# Patient Record
Sex: Male | Born: 1940
Health system: Southern US, Community
[De-identification: ages and names within clinical notes are randomized; demographics above are authoritative.]

## PROBLEM LIST (undated history)

## (undated) DIAGNOSIS — E079 Disorder of thyroid, unspecified: Secondary | ICD-10-CM

## (undated) DIAGNOSIS — K5790 Diverticulosis of intestine, part unspecified, without perforation or abscess without bleeding: Secondary | ICD-10-CM

## (undated) DIAGNOSIS — C4491 Basal cell carcinoma of skin, unspecified: Secondary | ICD-10-CM

## (undated) DIAGNOSIS — G44059 Short lasting unilateral neuralgiform headache with conjunctival injection and tearing (SUNCT), not intractable: Secondary | ICD-10-CM

## (undated) DIAGNOSIS — E039 Hypothyroidism, unspecified: Secondary | ICD-10-CM

## (undated) DIAGNOSIS — Z860101 Personal history of adenomatous and serrated colon polyps: Secondary | ICD-10-CM

## (undated) DIAGNOSIS — E7800 Pure hypercholesterolemia, unspecified: Secondary | ICD-10-CM

## (undated) DIAGNOSIS — C801 Malignant (primary) neoplasm, unspecified: Secondary | ICD-10-CM

## (undated) DIAGNOSIS — E785 Hyperlipidemia, unspecified: Secondary | ICD-10-CM

## (undated) DIAGNOSIS — I10 Essential (primary) hypertension: Secondary | ICD-10-CM

## (undated) DIAGNOSIS — K219 Gastro-esophageal reflux disease without esophagitis: Secondary | ICD-10-CM

## (undated) DIAGNOSIS — K649 Unspecified hemorrhoids: Secondary | ICD-10-CM

## (undated) DIAGNOSIS — R7303 Prediabetes: Secondary | ICD-10-CM

## (undated) DIAGNOSIS — C44311 Basal cell carcinoma of skin of nose: Secondary | ICD-10-CM

## (undated) HISTORY — PX: INGUINAL HERNIA REPAIR: SHX194

## (undated) HISTORY — PX: CARTILAGE SURGERY: SHX1303

## (undated) HISTORY — PX: EYE SURGERY: SHX253

## (undated) HISTORY — DX: Short lasting unilateral neuralgiform headache with conjunctival injection and tearing (SUNCT), not intractable: G44.059

## (undated) HISTORY — PX: HERNIA REPAIR: SHX51

## (undated) HISTORY — PX: SKIN CANCER EXCISION: SHX779

## (undated) HISTORY — PX: THYROID SURGERY: SHX805

## (undated) HISTORY — DX: Essential (primary) hypertension: I10

---

## 1997-08-28 HISTORY — PX: TOTAL THYROIDECTOMY: SHX2547

## 1999-08-29 HISTORY — PX: PROSTATECTOMY: SHX69

## 2008-09-23 ENCOUNTER — Encounter: Admission: RE | Admit: 2008-09-23 | Discharge: 2008-09-23 | Payer: Self-pay | Admitting: Internal Medicine

## 2009-07-08 ENCOUNTER — Ambulatory Visit (HOSPITAL_COMMUNITY): Admission: RE | Admit: 2009-07-08 | Discharge: 2009-07-08 | Payer: Self-pay | Admitting: Gastroenterology

## 2011-05-31 ENCOUNTER — Other Ambulatory Visit: Payer: Self-pay | Admitting: Internal Medicine

## 2011-05-31 ENCOUNTER — Ambulatory Visit
Admission: RE | Admit: 2011-05-31 | Discharge: 2011-05-31 | Disposition: A | Discharge status: Home / Self Care | Payer: BC Managed Care – PPO | Source: Ambulatory Visit | Attending: Internal Medicine | Admitting: Internal Medicine

## 2011-05-31 DIAGNOSIS — K219 Gastro-esophageal reflux disease without esophagitis: Secondary | ICD-10-CM

## 2011-09-01 DIAGNOSIS — Z8546 Personal history of malignant neoplasm of prostate: Secondary | ICD-10-CM | POA: Diagnosis not present

## 2011-09-01 DIAGNOSIS — N529 Male erectile dysfunction, unspecified: Secondary | ICD-10-CM | POA: Diagnosis not present

## 2011-09-07 DIAGNOSIS — Z0000 Encounter for general adult medical examination without abnormal findings: Secondary | ICD-10-CM | POA: Diagnosis not present

## 2011-09-07 DIAGNOSIS — R030 Elevated blood-pressure reading, without diagnosis of hypertension: Secondary | ICD-10-CM | POA: Diagnosis not present

## 2011-09-07 DIAGNOSIS — C61 Malignant neoplasm of prostate: Secondary | ICD-10-CM | POA: Diagnosis not present

## 2011-09-07 DIAGNOSIS — E039 Hypothyroidism, unspecified: Secondary | ICD-10-CM | POA: Diagnosis not present

## 2011-09-07 DIAGNOSIS — J309 Allergic rhinitis, unspecified: Secondary | ICD-10-CM | POA: Diagnosis not present

## 2011-09-07 DIAGNOSIS — E785 Hyperlipidemia, unspecified: Secondary | ICD-10-CM | POA: Diagnosis not present

## 2011-09-07 DIAGNOSIS — E663 Overweight: Secondary | ICD-10-CM | POA: Diagnosis not present

## 2011-09-18 DIAGNOSIS — I10 Essential (primary) hypertension: Secondary | ICD-10-CM | POA: Diagnosis not present

## 2011-09-21 DIAGNOSIS — R030 Elevated blood-pressure reading, without diagnosis of hypertension: Secondary | ICD-10-CM | POA: Diagnosis not present

## 2011-10-19 DIAGNOSIS — R030 Elevated blood-pressure reading, without diagnosis of hypertension: Secondary | ICD-10-CM | POA: Diagnosis not present

## 2011-10-19 DIAGNOSIS — K439 Ventral hernia without obstruction or gangrene: Secondary | ICD-10-CM | POA: Diagnosis not present

## 2011-11-08 DIAGNOSIS — J209 Acute bronchitis, unspecified: Secondary | ICD-10-CM | POA: Diagnosis not present

## 2012-01-08 DIAGNOSIS — Z8546 Personal history of malignant neoplasm of prostate: Secondary | ICD-10-CM | POA: Diagnosis not present

## 2012-01-08 DIAGNOSIS — N529 Male erectile dysfunction, unspecified: Secondary | ICD-10-CM | POA: Diagnosis not present

## 2012-01-15 DIAGNOSIS — C44319 Basal cell carcinoma of skin of other parts of face: Secondary | ICD-10-CM | POA: Diagnosis not present

## 2012-01-15 DIAGNOSIS — L821 Other seborrheic keratosis: Secondary | ICD-10-CM | POA: Diagnosis not present

## 2012-01-15 DIAGNOSIS — L570 Actinic keratosis: Secondary | ICD-10-CM | POA: Diagnosis not present

## 2012-01-24 DIAGNOSIS — C44111 Basal cell carcinoma of skin of unspecified eyelid, including canthus: Secondary | ICD-10-CM | POA: Diagnosis not present

## 2012-02-27 DIAGNOSIS — J310 Chronic rhinitis: Secondary | ICD-10-CM | POA: Diagnosis not present

## 2012-02-27 DIAGNOSIS — J342 Deviated nasal septum: Secondary | ICD-10-CM | POA: Diagnosis not present

## 2012-02-28 ENCOUNTER — Other Ambulatory Visit (INDEPENDENT_AMBULATORY_CARE_PROVIDER_SITE_OTHER): Payer: Self-pay | Admitting: Otolaryngology

## 2012-02-28 DIAGNOSIS — J0100 Acute maxillary sinusitis, unspecified: Secondary | ICD-10-CM

## 2012-03-01 ENCOUNTER — Ambulatory Visit
Admission: RE | Admit: 2012-03-01 | Discharge: 2012-03-01 | Disposition: A | Discharge status: Home / Self Care | Payer: Medicare Other | Source: Ambulatory Visit | Attending: Otolaryngology | Admitting: Otolaryngology

## 2012-03-01 DIAGNOSIS — J0100 Acute maxillary sinusitis, unspecified: Secondary | ICD-10-CM

## 2012-03-06 DIAGNOSIS — E039 Hypothyroidism, unspecified: Secondary | ICD-10-CM | POA: Diagnosis not present

## 2012-03-06 DIAGNOSIS — E782 Mixed hyperlipidemia: Secondary | ICD-10-CM | POA: Diagnosis not present

## 2012-03-06 DIAGNOSIS — K219 Gastro-esophageal reflux disease without esophagitis: Secondary | ICD-10-CM | POA: Diagnosis not present

## 2012-03-06 DIAGNOSIS — R030 Elevated blood-pressure reading, without diagnosis of hypertension: Secondary | ICD-10-CM | POA: Diagnosis not present

## 2012-04-02 DIAGNOSIS — L723 Sebaceous cyst: Secondary | ICD-10-CM | POA: Diagnosis not present

## 2012-04-02 DIAGNOSIS — L821 Other seborrheic keratosis: Secondary | ICD-10-CM | POA: Diagnosis not present

## 2012-04-09 DIAGNOSIS — J342 Deviated nasal septum: Secondary | ICD-10-CM | POA: Diagnosis not present

## 2012-04-09 DIAGNOSIS — J343 Hypertrophy of nasal turbinates: Secondary | ICD-10-CM | POA: Diagnosis not present

## 2012-07-02 DIAGNOSIS — J029 Acute pharyngitis, unspecified: Secondary | ICD-10-CM | POA: Diagnosis not present

## 2012-07-02 DIAGNOSIS — J019 Acute sinusitis, unspecified: Secondary | ICD-10-CM | POA: Diagnosis not present

## 2012-07-30 DIAGNOSIS — C44319 Basal cell carcinoma of skin of other parts of face: Secondary | ICD-10-CM | POA: Diagnosis not present

## 2012-08-02 DIAGNOSIS — Z23 Encounter for immunization: Secondary | ICD-10-CM | POA: Diagnosis not present

## 2012-08-30 DIAGNOSIS — B029 Zoster without complications: Secondary | ICD-10-CM | POA: Diagnosis not present

## 2012-09-04 DIAGNOSIS — C44319 Basal cell carcinoma of skin of other parts of face: Secondary | ICD-10-CM | POA: Diagnosis not present

## 2012-09-09 DIAGNOSIS — E785 Hyperlipidemia, unspecified: Secondary | ICD-10-CM | POA: Diagnosis not present

## 2012-09-09 DIAGNOSIS — Z0000 Encounter for general adult medical examination without abnormal findings: Secondary | ICD-10-CM | POA: Diagnosis not present

## 2012-09-09 DIAGNOSIS — E039 Hypothyroidism, unspecified: Secondary | ICD-10-CM | POA: Diagnosis not present

## 2012-11-20 DIAGNOSIS — K625 Hemorrhage of anus and rectum: Secondary | ICD-10-CM | POA: Diagnosis not present

## 2012-11-20 DIAGNOSIS — K644 Residual hemorrhoidal skin tags: Secondary | ICD-10-CM | POA: Diagnosis not present

## 2012-11-20 DIAGNOSIS — K648 Other hemorrhoids: Secondary | ICD-10-CM | POA: Diagnosis not present

## 2012-12-19 DIAGNOSIS — L723 Sebaceous cyst: Secondary | ICD-10-CM | POA: Diagnosis not present

## 2013-03-04 DIAGNOSIS — Z8546 Personal history of malignant neoplasm of prostate: Secondary | ICD-10-CM | POA: Diagnosis not present

## 2013-03-04 DIAGNOSIS — C61 Malignant neoplasm of prostate: Secondary | ICD-10-CM | POA: Diagnosis not present

## 2013-03-10 DIAGNOSIS — S92919A Unspecified fracture of unspecified toe(s), initial encounter for closed fracture: Secondary | ICD-10-CM | POA: Diagnosis not present

## 2013-03-12 DIAGNOSIS — J0100 Acute maxillary sinusitis, unspecified: Secondary | ICD-10-CM | POA: Diagnosis not present

## 2013-03-13 DIAGNOSIS — Z8546 Personal history of malignant neoplasm of prostate: Secondary | ICD-10-CM | POA: Diagnosis not present

## 2013-03-13 DIAGNOSIS — N529 Male erectile dysfunction, unspecified: Secondary | ICD-10-CM | POA: Diagnosis not present

## 2013-03-18 DIAGNOSIS — L570 Actinic keratosis: Secondary | ICD-10-CM | POA: Diagnosis not present

## 2013-03-18 DIAGNOSIS — C44711 Basal cell carcinoma of skin of unspecified lower limb, including hip: Secondary | ICD-10-CM | POA: Diagnosis not present

## 2013-03-18 DIAGNOSIS — C4441 Basal cell carcinoma of skin of scalp and neck: Secondary | ICD-10-CM | POA: Diagnosis not present

## 2013-04-23 DIAGNOSIS — C44711 Basal cell carcinoma of skin of unspecified lower limb, including hip: Secondary | ICD-10-CM | POA: Diagnosis not present

## 2013-05-05 ENCOUNTER — Encounter (HOSPITAL_COMMUNITY): Payer: Self-pay | Admitting: Emergency Medicine

## 2013-05-05 ENCOUNTER — Emergency Department (HOSPITAL_COMMUNITY)
Admission: EM | Admit: 2013-05-05 | Discharge: 2013-05-05 | Disposition: A | Discharge status: Home / Self Care | Payer: Medicare Other | Attending: Emergency Medicine | Admitting: Emergency Medicine

## 2013-05-05 DIAGNOSIS — R109 Unspecified abdominal pain: Secondary | ICD-10-CM | POA: Insufficient documentation

## 2013-05-05 HISTORY — DX: Disorder of thyroid, unspecified: E07.9

## 2013-05-05 HISTORY — DX: Gastro-esophageal reflux disease without esophagitis: K21.9

## 2013-05-05 HISTORY — DX: Malignant (primary) neoplasm, unspecified: C80.1

## 2013-05-05 LAB — CBC
HCT: 46.2 % (ref 39.0–52.0)
Hemoglobin: 15.9 g/dL (ref 13.0–17.0)
MCV: 85.9 fL (ref 78.0–100.0)
RDW: 13.7 % (ref 11.5–15.5)
WBC: 10.1 10*3/uL (ref 4.0–10.5)

## 2013-05-05 LAB — COMPREHENSIVE METABOLIC PANEL
Alkaline Phosphatase: 80 U/L (ref 39–117)
BUN: 13 mg/dL (ref 6–23)
Chloride: 100 mEq/L (ref 96–112)
Creatinine, Ser: 0.95 mg/dL (ref 0.50–1.35)
GFR calc Af Amer: >90 mL/min (ref >90)
GFR calc non Af Amer: 82 mL/min — ABNORMAL LOW (ref >90)
Glucose, Bld: 98 mg/dL (ref 70–99)
Potassium: 4.1 mEq/L (ref 3.5–5.1)
Total Bilirubin: 0.8 mg/dL (ref 0.3–1.2)

## 2013-05-05 LAB — URINALYSIS, ROUTINE W REFLEX MICROSCOPIC
Ketones, ur: NEGATIVE mg/dL
Leukocytes, UA: NEGATIVE
Nitrite: NEGATIVE
Protein, ur: NEGATIVE mg/dL
Urobilinogen, UA: 0.2 mg/dL (ref 0.0–1.0)

## 2013-05-05 NOTE — ED Provider Notes (Signed)
CSN: [id number]     Arrival date & time 05/05/13  0741 History   First MD Initiated Contact with Patient 05/05/13 0747     No chief complaint on file.  (Consider location/radiation/quality/duration/timing/severity/associated sxs/prior Treatment) HPI Comments: 72 yo wm with cc of abd pain below umbilicus. Pt is s/p Mohs to LLE on 12 days ago.  Onset of pain 1700 05/04/13.  No f/c, n/v/d, f/u/d, hematuria, constipation or trauma.    Patient is a 72 y.o. male presenting with abdominal pain. The history is provided by the patient and the spouse.  Abdominal Pain Pain location:  Suprapubic Pain quality: aching   Pain quality: not bloating, not burning, not cramping, no fullness, not gnawing, not heavy and no pressure   Pain radiates to:  Does not radiate Pain severity:  Moderate Onset quality:  Gradual Duration:  15 hours Timing:  Constant Progression:  Unchanged Chronicity:  New Context: sick contacts   Context: not alcohol use, not awakening from sleep, not diet changes, not eating, not laxative use, not suspicious food intake and not trauma   Relieved by:  Nothing Worsened by:  Nothing tried Associated symptoms: no chills, no constipation, no cough, no diarrhea, no dysuria, no fatigue, no fever, no melena, no nausea, no shortness of breath and no sore throat   Risk factors: being elderly   Risk factors: no alcohol abuse, no aspirin use, has not had multiple surgeries, no NSAID use and not obese     No past medical history on file. No past surgical history on file. No family history on file. History  Substance Use Topics  . Smoking status: Not on file  . Smokeless tobacco: Not on file  . Alcohol Use: Not on file    Review of Systems  Constitutional: Negative for fever, chills and fatigue.  HENT: Negative.  Negative for sore throat.   Eyes: Negative.   Respiratory: Negative.  Negative for cough and shortness of breath.   Cardiovascular: Negative.   Gastrointestinal: Positive for  abdominal pain. Negative for nausea, diarrhea, constipation, blood in stool, melena, abdominal distention, anal bleeding and rectal pain.  Endocrine: Negative.   Genitourinary: Negative for dysuria.  Musculoskeletal: Negative.        Had Mohs procedure to anterior aspect of LLE shin; no issues with incision; sutures in place   Skin: Positive for wound.       Mohs procedure  Neurological: Negative.   Hematological: Negative.   Psychiatric/Behavioral: Negative.     Allergies  Review of patient's allergies indicates not on file.  Home Medications  No current outpatient prescriptions on file. There were no vitals taken for this visit. Physical Exam  Constitutional: He appears well-developed and well-nourished.  HENT:  Head: Normocephalic and atraumatic.  Eyes: Conjunctivae and EOM are normal. Pupils are equal, round, and reactive to light.  Neck: Normal range of motion. Neck supple.  Cardiovascular: Normal rate and regular rhythm.   Pulmonary/Chest: Effort normal and breath sounds normal.  Abdominal: Hernia confirmed negative in the right inguinal area and confirmed negative in the left inguinal area.    Genitourinary: Testes normal and penis normal. Cremasteric reflex is present. Circumcised.  No palpable hernia, no mass, no scrotal edema, 2 testicles descended nontender  Lymphadenopathy:       Right: No inguinal adenopathy present.       Left: No inguinal adenopathy present.   ABD: soft, NT, nd, no g/r/m, no HSM, no ttp at McBurney's point, Neg Murphys, benign exam, no  peritoneal signs.   Repeat exams unchanged.    ED Course  Procedures (including critical care time) Labs Review Labs Reviewed - No data to display Imaging Review No results found.  Results for orders placed during the hospital encounter of 05/05/13  CBC      Result Value Range   WBC 10.1  4.0 - 10.5 K/uL   RBC 5.38  4.22 - 5.81 MIL/uL   Hemoglobin 15.9  13.0 - 17.0 g/dL   HCT 11.9  14.7 - 82.9 %   MCV  85.9  78.0 - 100.0 fL   MCH 29.6  26.0 - 34.0 pg   MCHC 34.4  30.0 - 36.0 g/dL   RDW 56.2  13.0 - 86.5 %   Platelets 181  150 - 400 K/uL  COMPREHENSIVE METABOLIC PANEL      Result Value Range   Sodium 135  135 - 145 mEq/L   Potassium 4.1  3.5 - 5.1 mEq/L   Chloride 100  96 - 112 mEq/L   CO2 27  19 - 32 mEq/L   Glucose, Bld 98  70 - 99 mg/dL   BUN 13  6 - 23 mg/dL   Creatinine, Ser 7.84  0.50 - 1.35 mg/dL   Calcium 9.4  8.4 - 69.6 mg/dL   Total Protein 6.7  6.0 - 8.3 g/dL   Albumin 3.5  3.5 - 5.2 g/dL   AST 17  0 - 37 U/L   ALT 10  0 - 53 U/L   Alkaline Phosphatase 80  39 - 117 U/L   Total Bilirubin 0.8  0.3 - 1.2 mg/dL   GFR calc non Af Amer 82 (*) >90 mL/min   GFR calc Af Amer >90  >90 mL/min  URINALYSIS, ROUTINE W REFLEX MICROSCOPIC      Result Value Range   Color, Urine YELLOW  YELLOW   APPearance CLEAR  CLEAR   Specific Gravity, Urine 1.014  1.005 - 1.030   pH 6.5  5.0 - 8.0   Glucose, UA NEGATIVE  NEGATIVE mg/dL   Hgb urine dipstick NEGATIVE  NEGATIVE   Bilirubin Urine NEGATIVE  NEGATIVE   Ketones, ur NEGATIVE  NEGATIVE mg/dL   Protein, ur NEGATIVE  NEGATIVE mg/dL   Urobilinogen, UA 0.2  0.0 - 1.0 mg/dL   Nitrite NEGATIVE  NEGATIVE   Leukocytes, UA NEGATIVE  NEGATIVE    Date: 05/05/2013  Rate: 66  Rhythm: normal sinus rhythm  QRS Axis: normal  Intervals: QRS duration 114  ST/T Wave abnormalities: nonspecific ST changes  Conduction Disutrbances:nonspecific intraventricular conduction delay  Narrative Interpretation: NS ST changes  Old EKG Reviewed: none available    MDM  No diagnosis found. 72 year old white male presents emergency department with chief complaint of abdominal pain. The pain began one day ago.  Of note the patient denies fever or chills, nausea vomiting diarrhea, frequency urgency dysuria, constipation patient has normal vital signs, his abdominal exam is benign, he has no tenderness to palpation except for mild tenderness below his umbilicus or  peritoneal signs.  10:39 AM Vital signs normal, abdominal exam remains unchanged. No evidence of serious bacterial illness, DVT, surgical abdomen. I had a lengthy discussion with the patient and his wife regarding my plan. He is to return to emergency department immediately if he develops fever, worsening pain, vomiting, hematuria, lower extremity edema, or any other concern. At that point he will require a CT abdomen and pelvis to assess for intra-abdominal pathology. Patient has a primary care doctor  within making followup within the next couple days and he also has a followup with dermatology in 2 days.   The patient appears reasonably screened and/or stabilized for discharge and I doubt any other medical condition or other [facility] requiring further screening, evaluation, or treatment in the ED at this time prior to discharge. Pt and his wife had no questions at time of discharge.      Darlys Gales, MD 05/05/13 1044

## 2013-05-05 NOTE — ED Notes (Signed)
Lower abd pain since 5 pm no n/v/d denies dysuria, [facility] had skin camcer removed left lower leg 12 days ago

## 2013-05-07 DIAGNOSIS — C4441 Basal cell carcinoma of skin of scalp and neck: Secondary | ICD-10-CM | POA: Diagnosis not present

## 2013-07-08 DIAGNOSIS — Z23 Encounter for immunization: Secondary | ICD-10-CM | POA: Diagnosis not present

## 2013-07-10 DIAGNOSIS — N529 Male erectile dysfunction, unspecified: Secondary | ICD-10-CM | POA: Diagnosis not present

## 2013-09-10 DIAGNOSIS — J309 Allergic rhinitis, unspecified: Secondary | ICD-10-CM | POA: Diagnosis not present

## 2013-09-10 DIAGNOSIS — C61 Malignant neoplasm of prostate: Secondary | ICD-10-CM | POA: Diagnosis not present

## 2013-09-10 DIAGNOSIS — E782 Mixed hyperlipidemia: Secondary | ICD-10-CM | POA: Diagnosis not present

## 2013-09-10 DIAGNOSIS — E039 Hypothyroidism, unspecified: Secondary | ICD-10-CM | POA: Diagnosis not present

## 2013-09-10 DIAGNOSIS — Z0000 Encounter for general adult medical examination without abnormal findings: Secondary | ICD-10-CM | POA: Diagnosis not present

## 2013-10-17 ENCOUNTER — Emergency Department (HOSPITAL_COMMUNITY)
Admission: EM | Admit: 2013-10-17 | Discharge: 2013-10-17 | Disposition: A | Discharge status: Home / Self Care | Payer: Medicare Other | Attending: Emergency Medicine | Admitting: Emergency Medicine

## 2013-10-17 ENCOUNTER — Encounter (HOSPITAL_COMMUNITY): Payer: Self-pay | Admitting: Emergency Medicine

## 2013-10-17 DIAGNOSIS — E079 Disorder of thyroid, unspecified: Secondary | ICD-10-CM | POA: Insufficient documentation

## 2013-10-17 DIAGNOSIS — K219 Gastro-esophageal reflux disease without esophagitis: Secondary | ICD-10-CM | POA: Insufficient documentation

## 2013-10-17 DIAGNOSIS — Z8546 Personal history of malignant neoplasm of prostate: Secondary | ICD-10-CM | POA: Insufficient documentation

## 2013-10-17 DIAGNOSIS — M79604 Pain in right leg: Secondary | ICD-10-CM

## 2013-10-17 DIAGNOSIS — M79609 Pain in unspecified limb: Secondary | ICD-10-CM | POA: Insufficient documentation

## 2013-10-17 DIAGNOSIS — Z7982 Long term (current) use of aspirin: Secondary | ICD-10-CM | POA: Insufficient documentation

## 2013-10-17 DIAGNOSIS — Z79899 Other long term (current) drug therapy: Secondary | ICD-10-CM | POA: Insufficient documentation

## 2013-10-17 MED ORDER — ENOXAPARIN SODIUM 120 MG/0.8ML ~~LOC~~ SOLN
1.0000 mg/kg | Freq: Once | SUBCUTANEOUS | Status: AC
  Administered 2013-10-17: 110 mg via SUBCUTANEOUS
  Filled 2013-10-17: qty 0.8

## 2013-10-17 NOTE — ED Provider Notes (Signed)
CSN: [id number]     Arrival date & time 10/17/13  1858 History   First MD Initiated Contact with Patient 10/17/13 2007     Chief Complaint  Patient presents with  . Leg Pain     (Consider location/radiation/quality/duration/timing/severity/associated sxs/prior Treatment) HPI History provided by pt.   Pt presents w/ RLE pain x 1 week.  Started in popliteal space and has since spread proximally, today all the way up posteromedial thigh to the groin.  Was evaluated at an Urgent Care and referred to ED for DVT study.  Traveled by both car and plane to [place] the day of onset.  No associated fever, RLE edema/weakness/paresthesias/rash, CP or SOB.  Denies trauma.  No PMH.  Past Medical History  Diagnosis Date  . Acid reflux   . Cancer     prostate  . Thyroid disease    Past Surgical History  Procedure Laterality Date  . Thyroid surgery     History reviewed. No pertinent family history. History  Substance Use Topics  . Smoking status: Never Smoker   . Smokeless tobacco: Not on file  . Alcohol Use: Yes    Review of Systems  All other systems reviewed and are negative.      Allergies  Ivp dye  Home Medications   Current Outpatient Rx  Name  Route  Sig  Dispense  Refill  . aspirin 325 MG tablet   Oral   Take 325 mg by mouth daily.         Marland Kitchen esomeprazole (NEXIUM) 40 MG capsule   Oral   Take 40 mg by mouth daily before breakfast.         . levothyroxine (SYNTHROID, LEVOTHROID) 150 MCG tablet   Oral   Take 150 mcg by mouth daily before breakfast.          BP 175/88  Pulse 72  Temp(Src) 98.3 F (36.8 C) (Oral)  Resp 20  SpO2 100% Physical Exam  Nursing note and vitals reviewed. Constitutional: He is oriented to person, place, and time. He appears well-developed and well-nourished. No distress.  HENT:  Head: Normocephalic and atraumatic.  Eyes:  Normal appearance  Neck: Normal range of motion.  Cardiovascular: Normal rate and regular rhythm.    Pulmonary/Chest: Effort normal and breath sounds normal. No respiratory distress.  Musculoskeletal: Normal range of motion.  RLE w/out deformity, skin changes, edema.  Mild tenderness popliteal space and posteromedial thigh.  Pain posterior knee w/ passive flexion; no pain w/ plantar/dorsiflexion of ankle or flexion or internal/external rotation of hip. 2+ DP pulse and distal sensation intact.    Neurological: He is alert and oriented to person, place, and time.  Skin: Skin is warm and dry. No rash noted.  Psychiatric: He has a normal mood and affect. His behavior is normal.    ED Course  Procedures (including critical care time) Labs Review Labs Reviewed - No data to display Imaging Review No results found.  EKG Interpretation   None       MDM   Final diagnoses:  Leg pain, right    72yo healthy M presents w/ non-traumatic RLE pain x 1 week.  Was referred by an Urgent Care in [place] for DVT study.  RF for DVT include recent travel.  Tenderness right popliteal space and thigh, but no objective exam findings concerning for DVT.  Pt denies CP/SOB and VS w/in nml range.  No signs of infectious process. Prophylactic subq lovenox administered.  Outpatient venous duplex ordered  and instructions given to patient.  Advised return for CP/SOB in the meantime.      Remer Macho, PA-C 10/17/13 2100

## 2013-10-17 NOTE — ED Notes (Addendum)
Pt in c/o pain to right leg, pain started behind his knee and has spread to inside of right thigh, states he had long car ride and a week ago and again on Tuesday. Pain started a week ago. Denies redness or swelling to area.

## 2013-10-17 NOTE — ED Notes (Signed)
Pt reports pain begins in RLE just below the knee and radiates up to groin area. No edema or redness noted, area is not tender on palpation or with ambulation. Pt reports RLE "gave out" a couple of times yesterday while ambulating

## 2013-10-17 NOTE — Discharge Instructions (Signed)
Please return to the hospital tomorrow at 8am for ultrasound of right leg.  Enter the [employer] and go to [employer]".  Let them know at the desk that you are here for an ultrasound of your leg to rule out a blood clot.  Return to the ER immediately if you develop chest pain or shortness of breath in the meantime.

## 2013-10-18 ENCOUNTER — Ambulatory Visit (HOSPITAL_COMMUNITY)
Admission: RE | Admit: 2013-10-18 | Discharge: 2013-10-18 | Disposition: A | Discharge status: Home / Self Care | Payer: Medicare Other | Source: Ambulatory Visit | Attending: Emergency Medicine | Admitting: Emergency Medicine

## 2013-10-18 DIAGNOSIS — M79609 Pain in unspecified limb: Secondary | ICD-10-CM | POA: Insufficient documentation

## 2013-10-18 NOTE — Progress Notes (Signed)
VASCULAR LAB PRELIMINARY  PRELIMINARY  PRELIMINARY  PRELIMINARY  Right lower extremity venous Doppler completed.    Preliminary report:  There is no DVT or SVT noted in the right lower extremity.  Cinnamon Morency, RVT 10/18/2013, 9:10 AM

## 2013-10-18 NOTE — ED Provider Notes (Signed)
Medical screening examination/treatment/procedure(s) were performed by non-physician practitioner and as supervising physician I was immediately available for consultation/collaboration.  EKG Interpretation   None        Babette Relic, MD 10/18/13 [phone]

## 2013-11-06 DIAGNOSIS — N529 Male erectile dysfunction, unspecified: Secondary | ICD-10-CM | POA: Diagnosis not present

## 2014-02-25 DIAGNOSIS — Z8546 Personal history of malignant neoplasm of prostate: Secondary | ICD-10-CM | POA: Diagnosis not present

## 2014-03-09 DIAGNOSIS — Z8546 Personal history of malignant neoplasm of prostate: Secondary | ICD-10-CM | POA: Diagnosis not present

## 2014-03-09 DIAGNOSIS — N393 Stress incontinence (female) (male): Secondary | ICD-10-CM | POA: Diagnosis not present

## 2014-03-09 DIAGNOSIS — N529 Male erectile dysfunction, unspecified: Secondary | ICD-10-CM | POA: Diagnosis not present

## 2014-04-23 DIAGNOSIS — C44319 Basal cell carcinoma of skin of other parts of face: Secondary | ICD-10-CM | POA: Diagnosis not present

## 2014-04-23 DIAGNOSIS — L538 Other specified erythematous conditions: Secondary | ICD-10-CM | POA: Diagnosis not present

## 2014-04-23 DIAGNOSIS — L570 Actinic keratosis: Secondary | ICD-10-CM | POA: Diagnosis not present

## 2014-04-23 DIAGNOSIS — C4441 Basal cell carcinoma of skin of scalp and neck: Secondary | ICD-10-CM | POA: Diagnosis not present

## 2014-04-29 DIAGNOSIS — C4490 Unspecified malignant neoplasm of skin, unspecified: Secondary | ICD-10-CM | POA: Diagnosis not present

## 2014-04-29 DIAGNOSIS — C44319 Basal cell carcinoma of skin of other parts of face: Secondary | ICD-10-CM | POA: Diagnosis not present

## 2014-06-17 ENCOUNTER — Other Ambulatory Visit: Payer: Self-pay | Admitting: Gastroenterology

## 2014-07-15 DIAGNOSIS — Z23 Encounter for immunization: Secondary | ICD-10-CM | POA: Diagnosis not present

## 2014-08-05 DIAGNOSIS — C44311 Basal cell carcinoma of skin of nose: Secondary | ICD-10-CM | POA: Diagnosis not present

## 2014-08-06 DIAGNOSIS — C44311 Basal cell carcinoma of skin of nose: Secondary | ICD-10-CM | POA: Diagnosis not present

## 2014-08-06 DIAGNOSIS — M950 Acquired deformity of nose: Secondary | ICD-10-CM | POA: Diagnosis not present

## 2014-08-12 DIAGNOSIS — C44311 Basal cell carcinoma of skin of nose: Secondary | ICD-10-CM | POA: Diagnosis not present

## 2014-08-12 DIAGNOSIS — Z7982 Long term (current) use of aspirin: Secondary | ICD-10-CM | POA: Diagnosis not present

## 2014-08-12 DIAGNOSIS — Z91041 Radiographic dye allergy status: Secondary | ICD-10-CM | POA: Diagnosis not present

## 2014-08-12 DIAGNOSIS — Z888 Allergy status to other drugs, medicaments and biological substances status: Secondary | ICD-10-CM | POA: Diagnosis not present

## 2014-08-12 DIAGNOSIS — K219 Gastro-esophageal reflux disease without esophagitis: Secondary | ICD-10-CM | POA: Diagnosis not present

## 2014-08-12 DIAGNOSIS — C61 Malignant neoplasm of prostate: Secondary | ICD-10-CM | POA: Diagnosis not present

## 2014-08-12 DIAGNOSIS — E039 Hypothyroidism, unspecified: Secondary | ICD-10-CM | POA: Diagnosis not present

## 2014-09-07 DIAGNOSIS — Z7982 Long term (current) use of aspirin: Secondary | ICD-10-CM | POA: Diagnosis not present

## 2014-09-07 DIAGNOSIS — C7989 Secondary malignant neoplasm of other specified sites: Secondary | ICD-10-CM | POA: Diagnosis not present

## 2014-09-07 DIAGNOSIS — C44311 Basal cell carcinoma of skin of nose: Secondary | ICD-10-CM | POA: Diagnosis not present

## 2014-09-07 DIAGNOSIS — E079 Disorder of thyroid, unspecified: Secondary | ICD-10-CM | POA: Diagnosis not present

## 2014-09-07 DIAGNOSIS — Z8546 Personal history of malignant neoplasm of prostate: Secondary | ICD-10-CM | POA: Diagnosis not present

## 2014-09-15 ENCOUNTER — Ambulatory Visit (HOSPITAL_COMMUNITY): Admission: RE | Admit: 2014-09-15 | Payer: Medicare Other | Source: Ambulatory Visit | Admitting: Gastroenterology

## 2014-09-15 ENCOUNTER — Encounter (HOSPITAL_COMMUNITY): Admission: RE | Payer: Self-pay | Source: Ambulatory Visit

## 2014-09-15 SURGERY — COLONOSCOPY WITH PROPOFOL
Anesthesia: Monitor Anesthesia Care

## 2014-10-01 DIAGNOSIS — E782 Mixed hyperlipidemia: Secondary | ICD-10-CM | POA: Diagnosis not present

## 2014-10-01 DIAGNOSIS — Z800 Family history of malignant neoplasm of digestive organs: Secondary | ICD-10-CM | POA: Diagnosis not present

## 2014-10-01 DIAGNOSIS — G518 Other disorders of facial nerve: Secondary | ICD-10-CM | POA: Diagnosis not present

## 2014-10-01 DIAGNOSIS — Z23 Encounter for immunization: Secondary | ICD-10-CM | POA: Diagnosis not present

## 2014-10-01 DIAGNOSIS — C61 Malignant neoplasm of prostate: Secondary | ICD-10-CM | POA: Diagnosis not present

## 2014-10-01 DIAGNOSIS — E039 Hypothyroidism, unspecified: Secondary | ICD-10-CM | POA: Diagnosis not present

## 2014-10-01 DIAGNOSIS — Z1389 Encounter for screening for other disorder: Secondary | ICD-10-CM | POA: Diagnosis not present

## 2014-10-01 DIAGNOSIS — Z0001 Encounter for general adult medical examination with abnormal findings: Secondary | ICD-10-CM | POA: Diagnosis not present

## 2014-11-05 DIAGNOSIS — L304 Erythema intertrigo: Secondary | ICD-10-CM | POA: Diagnosis not present

## 2014-11-05 DIAGNOSIS — L300 Nummular dermatitis: Secondary | ICD-10-CM | POA: Diagnosis not present

## 2014-11-05 DIAGNOSIS — L820 Inflamed seborrheic keratosis: Secondary | ICD-10-CM | POA: Diagnosis not present

## 2015-01-15 ENCOUNTER — Other Ambulatory Visit: Payer: Self-pay | Admitting: Gastroenterology

## 2015-01-15 DIAGNOSIS — L300 Nummular dermatitis: Secondary | ICD-10-CM | POA: Diagnosis not present

## 2015-01-15 DIAGNOSIS — C44519 Basal cell carcinoma of skin of other part of trunk: Secondary | ICD-10-CM | POA: Diagnosis not present

## 2015-01-15 DIAGNOSIS — L304 Erythema intertrigo: Secondary | ICD-10-CM | POA: Diagnosis not present

## 2015-01-30 DIAGNOSIS — R233 Spontaneous ecchymoses: Secondary | ICD-10-CM | POA: Diagnosis not present

## 2015-02-01 DIAGNOSIS — R233 Spontaneous ecchymoses: Secondary | ICD-10-CM | POA: Diagnosis not present

## 2015-02-01 DIAGNOSIS — R21 Rash and other nonspecific skin eruption: Secondary | ICD-10-CM | POA: Diagnosis not present

## 2015-02-01 DIAGNOSIS — Z0000 Encounter for general adult medical examination without abnormal findings: Secondary | ICD-10-CM | POA: Diagnosis not present

## 2015-02-10 DIAGNOSIS — H5203 Hypermetropia, bilateral: Secondary | ICD-10-CM | POA: Diagnosis not present

## 2015-02-10 DIAGNOSIS — H25811 Combined forms of age-related cataract, right eye: Secondary | ICD-10-CM | POA: Diagnosis not present

## 2015-02-10 DIAGNOSIS — H25812 Combined forms of age-related cataract, left eye: Secondary | ICD-10-CM | POA: Diagnosis not present

## 2015-02-10 DIAGNOSIS — H52223 Regular astigmatism, bilateral: Secondary | ICD-10-CM | POA: Diagnosis not present

## 2015-02-10 DIAGNOSIS — H43813 Vitreous degeneration, bilateral: Secondary | ICD-10-CM | POA: Diagnosis not present

## 2015-02-10 DIAGNOSIS — H524 Presbyopia: Secondary | ICD-10-CM | POA: Diagnosis not present

## 2015-02-10 DIAGNOSIS — H35373 Puckering of macula, bilateral: Secondary | ICD-10-CM | POA: Diagnosis not present

## 2015-02-10 DIAGNOSIS — H43313 Vitreous membranes and strands, bilateral: Secondary | ICD-10-CM | POA: Diagnosis not present

## 2015-02-23 DIAGNOSIS — C44319 Basal cell carcinoma of skin of other parts of face: Secondary | ICD-10-CM | POA: Diagnosis not present

## 2015-02-23 DIAGNOSIS — L304 Erythema intertrigo: Secondary | ICD-10-CM | POA: Diagnosis not present

## 2015-03-08 DIAGNOSIS — Z8546 Personal history of malignant neoplasm of prostate: Secondary | ICD-10-CM | POA: Diagnosis not present

## 2015-03-15 DIAGNOSIS — N5201 Erectile dysfunction due to arterial insufficiency: Secondary | ICD-10-CM | POA: Diagnosis not present

## 2015-03-15 DIAGNOSIS — N393 Stress incontinence (female) (male): Secondary | ICD-10-CM | POA: Diagnosis not present

## 2015-03-15 DIAGNOSIS — C61 Malignant neoplasm of prostate: Secondary | ICD-10-CM | POA: Diagnosis not present

## 2015-04-08 ENCOUNTER — Encounter (HOSPITAL_COMMUNITY): Payer: Self-pay | Admitting: *Deleted

## 2015-04-09 DIAGNOSIS — L304 Erythema intertrigo: Secondary | ICD-10-CM | POA: Diagnosis not present

## 2015-04-09 DIAGNOSIS — C44519 Basal cell carcinoma of skin of other part of trunk: Secondary | ICD-10-CM | POA: Diagnosis not present

## 2015-04-09 DIAGNOSIS — L570 Actinic keratosis: Secondary | ICD-10-CM | POA: Diagnosis not present

## 2015-04-13 ENCOUNTER — Ambulatory Visit (HOSPITAL_COMMUNITY)
Admission: RE | Admit: 2015-04-13 | Discharge: 2015-04-13 | Disposition: A | Discharge status: Home / Self Care | Payer: Medicare Other | Source: Ambulatory Visit | Attending: Gastroenterology | Admitting: Gastroenterology

## 2015-04-13 ENCOUNTER — Encounter (HOSPITAL_COMMUNITY)
Admission: RE | Disposition: A | Discharge status: Home / Self Care | Payer: Self-pay | Source: Ambulatory Visit | Attending: Gastroenterology

## 2015-04-13 ENCOUNTER — Ambulatory Visit (HOSPITAL_COMMUNITY): Payer: Medicare Other | Admitting: Anesthesiology

## 2015-04-13 ENCOUNTER — Encounter (HOSPITAL_COMMUNITY): Payer: Self-pay

## 2015-04-13 DIAGNOSIS — Z85828 Personal history of other malignant neoplasm of skin: Secondary | ICD-10-CM | POA: Diagnosis not present

## 2015-04-13 DIAGNOSIS — D120 Benign neoplasm of cecum: Secondary | ICD-10-CM | POA: Insufficient documentation

## 2015-04-13 DIAGNOSIS — Z8546 Personal history of malignant neoplasm of prostate: Secondary | ICD-10-CM | POA: Diagnosis not present

## 2015-04-13 DIAGNOSIS — Z1211 Encounter for screening for malignant neoplasm of colon: Secondary | ICD-10-CM | POA: Insufficient documentation

## 2015-04-13 DIAGNOSIS — Z800 Family history of malignant neoplasm of digestive organs: Secondary | ICD-10-CM | POA: Diagnosis not present

## 2015-04-13 DIAGNOSIS — E780 Pure hypercholesterolemia: Secondary | ICD-10-CM | POA: Insufficient documentation

## 2015-04-13 DIAGNOSIS — E039 Hypothyroidism, unspecified: Secondary | ICD-10-CM | POA: Diagnosis not present

## 2015-04-13 DIAGNOSIS — K219 Gastro-esophageal reflux disease without esophagitis: Secondary | ICD-10-CM | POA: Diagnosis not present

## 2015-04-13 DIAGNOSIS — D125 Benign neoplasm of sigmoid colon: Secondary | ICD-10-CM | POA: Insufficient documentation

## 2015-04-13 DIAGNOSIS — K635 Polyp of colon: Secondary | ICD-10-CM | POA: Diagnosis not present

## 2015-04-13 DIAGNOSIS — Z86010 Personal history of colonic polyps: Secondary | ICD-10-CM | POA: Diagnosis not present

## 2015-04-13 HISTORY — PX: COLONOSCOPY WITH PROPOFOL: SHX5780

## 2015-04-13 SURGERY — COLONOSCOPY WITH PROPOFOL
Anesthesia: Monitor Anesthesia Care

## 2015-04-13 MED ORDER — LACTATED RINGERS IV SOLN
INTRAVENOUS | Status: DC
  Administered 2015-04-13: 09:00:00 via INTRAVENOUS

## 2015-04-13 MED ORDER — LIDOCAINE HCL (CARDIAC) 20 MG/ML IV SOLN
INTRAVENOUS | Status: AC
  Filled 2015-04-13: qty 5

## 2015-04-13 MED ORDER — PROPOFOL 10 MG/ML IV BOLUS
INTRAVENOUS | Status: AC
  Filled 2015-04-13: qty 20

## 2015-04-13 MED ORDER — SODIUM CHLORIDE 0.9 % IV SOLN: INTRAVENOUS | Status: DC

## 2015-04-13 MED ORDER — PROPOFOL 500 MG/50ML IV EMUL
INTRAVENOUS | Status: DC | PRN
  Administered 2015-04-13: 50 mg via INTRAVENOUS

## 2015-04-13 MED ORDER — PROPOFOL INFUSION 10 MG/ML OPTIME
INTRAVENOUS | Status: DC | PRN
  Administered 2015-04-13: 140 ug/kg/min via INTRAVENOUS

## 2015-04-13 SURGICAL SUPPLY — 21 items

## 2015-04-13 NOTE — Transfer of Care (Signed)
Immediate Anesthesia Transfer of Care Note  Patient: Robert Clarke  Procedure(s) Performed: Procedure(s): COLONOSCOPY WITH PROPOFOL (N/A)  Patient Location: PACU  Anesthesia Type:MAC  Level of Consciousness: awake, alert  and oriented  Airway & Oxygen Therapy: Patient Spontanous Breathing and Patient connected to face mask oxygen  Post-op Assessment: Report given to RN and Post -op Vital signs reviewed and stable  Post vital signs: Reviewed and stable  Last Vitals:  Filed Vitals:   04/13/15 0915  BP: 147/78  Pulse: 66  Temp:   Resp: 10    Complications: No apparent anesthesia complications

## 2015-04-13 NOTE — Op Note (Signed)
Procedure: Screening colonoscopy. Father died of colon cancer at age 73  Endoscopist: Earle Gell  Premedication: Propofol administered by anesthesia  Procedure: The patient was placed in the left lateral decubitus position. Anal inspection and digital rectal exam were normal. The Pentax pediatric colonoscope was introduced into the rectum and advanced to the cecum. A normal-appearing appendiceal orifice and ileocecal valve were identified. Colonic preparation for the exam today was good. Withdrawal time was 25 minutes  Rectum. Normal. Retroflex view of the distal rectum was normal  Sigmoid colon. From the mid sigmoid colon, a 1 cm pedunculated polyp was removed with the electrocautery snare. A 3 mm sessile polyp was removed with the cold biopsy forceps. Colonic diverticulosis.  Descending colon. Colonic diverticulosis  Splenic flexure. Normal  Transverse colon. Normal  Hepatic flexure. Normal  Ascending colon. Normal  Cecum and ileocecal valve. A 3 mm sessile polyp was removed from the cecum with the cold biopsy forceps  Assessment: A diminutive polyp was removed from the cecum, a diminutive polyp was removed from the sigmoid colon, and a 1 cm pedunculated polyp was removed from the colon with the hot snare.  Recommendation: I will evaluate the pathology report determine when the patient should go a repeat colonoscopy

## 2015-04-13 NOTE — H&P (Signed)
  Procedure: Screening colonoscopy. Father died of colon cancer at age 73. Four previous colonoscopies were normal. Normal screening colonoscopy performed on 07/08/2009  History: The patient is a 74 year old male born 02-23-1941. His father died of colon cancer at age 30. His 4 previous colonoscopies showed no colorectal neoplasia. He is scheduled to undergo a repeat screening colonoscopy today.  Medication allergies: None  Past medical history: Prostatectomy performed in 2002. Thyroidectomy to treat goiter performed in 1995. Mohs surgery to remove basal cell skin cancers. Hernia repair surgery. Prostate cancer. Seasonal allergies. Hypercholesterolemia. Secondary hypothyroidism.  Exam: The patient is alert and lying comfortably on the endoscopy stretcher. Abdomen is soft and nontender to palpation. Lungs are clear to auscultation. Cardiac exam reveals a regular rhythm.  Plan: Proceed with screening colonoscopy

## 2015-04-13 NOTE — Anesthesia Postprocedure Evaluation (Signed)
  Anesthesia Post-op Note  Patient: Robert Clarke  Procedure(s) Performed: Procedure(s) (LRB): COLONOSCOPY WITH PROPOFOL (N/A)  Patient Location: PACU  Anesthesia Type: MAC  Level of Consciousness: awake and alert   Airway and Oxygen Therapy: Patient Spontanous Breathing  Post-op Pain: mild  Post-op Assessment: Post-op Vital signs reviewed, Patient's Cardiovascular Status Stable, Respiratory Function Stable, Patent Airway and No signs of Nausea or vomiting  Last Vitals:  Filed Vitals:   04/13/15 1030  BP: 134/70  Pulse: 63  Temp:   Resp: 22    Post-op Vital Signs: stable   Complications: No apparent anesthesia complications

## 2015-04-13 NOTE — Anesthesia Preprocedure Evaluation (Addendum)
Anesthesia Evaluation  Patient identified by MRN, date of birth, ID band Patient awake    Reviewed: Allergy & Precautions, NPO status , Patient's Chart, lab work & pertinent test results  Airway Mallampati: II  TM Distance: >3 FB Neck ROM: Full    Dental no notable dental hx. (+) Upper Dentures, Partial Lower   Pulmonary neg pulmonary ROS,  breath sounds clear to auscultation  Pulmonary exam normal       Cardiovascular negative cardio ROS Normal cardiovascular examRhythm:Regular Rate:Normal     Neuro/Psych negative neurological ROS  negative psych ROS   GI/Hepatic negative GI ROS, Neg liver ROS, GERD-  Medicated,  Endo/Other  negative endocrine ROS  Renal/GU negative Renal ROS  negative genitourinary   Musculoskeletal negative musculoskeletal ROS (+)   Abdominal   Peds negative pediatric ROS (+)  Hematology negative hematology ROS (+)   Anesthesia Other Findings   Reproductive/Obstetrics negative OB ROS                             Anesthesia Physical Anesthesia Plan  ASA: II  Anesthesia Plan: MAC   Post-op Pain Management:    Induction:   Airway Management Planned: Simple Face Mask and Natural Airway  Additional Equipment:   Intra-op Plan:   Post-operative Plan:   Informed Consent: I have reviewed the patients History and Physical, chart, labs and discussed the procedure including the risks, benefits and alternatives for the proposed anesthesia with the patient or authorized representative who has indicated his/her understanding and acceptance.   Dental advisory given  Plan Discussed with: CRNA  Anesthesia Plan Comments:         Anesthesia Quick Evaluation

## 2015-04-14 ENCOUNTER — Encounter (HOSPITAL_COMMUNITY): Payer: Self-pay | Admitting: Gastroenterology

## 2015-04-14 DIAGNOSIS — J302 Other seasonal allergic rhinitis: Secondary | ICD-10-CM | POA: Diagnosis not present

## 2015-04-14 DIAGNOSIS — T148 Other injury of unspecified body region: Secondary | ICD-10-CM | POA: Diagnosis not present

## 2015-04-14 DIAGNOSIS — M542 Cervicalgia: Secondary | ICD-10-CM | POA: Diagnosis not present

## 2015-08-06 DIAGNOSIS — Z23 Encounter for immunization: Secondary | ICD-10-CM | POA: Diagnosis not present

## 2015-10-03 DIAGNOSIS — J0100 Acute maxillary sinusitis, unspecified: Secondary | ICD-10-CM | POA: Diagnosis not present

## 2015-10-14 DIAGNOSIS — L821 Other seborrheic keratosis: Secondary | ICD-10-CM | POA: Diagnosis not present

## 2015-10-14 DIAGNOSIS — L304 Erythema intertrigo: Secondary | ICD-10-CM | POA: Diagnosis not present

## 2015-10-19 DIAGNOSIS — Z0000 Encounter for general adult medical examination without abnormal findings: Secondary | ICD-10-CM | POA: Diagnosis not present

## 2015-10-19 DIAGNOSIS — E039 Hypothyroidism, unspecified: Secondary | ICD-10-CM | POA: Diagnosis not present

## 2015-10-19 DIAGNOSIS — Z1389 Encounter for screening for other disorder: Secondary | ICD-10-CM | POA: Diagnosis not present

## 2015-10-19 DIAGNOSIS — J309 Allergic rhinitis, unspecified: Secondary | ICD-10-CM | POA: Diagnosis not present

## 2015-10-19 DIAGNOSIS — E782 Mixed hyperlipidemia: Secondary | ICD-10-CM | POA: Diagnosis not present

## 2015-10-19 DIAGNOSIS — C61 Malignant neoplasm of prostate: Secondary | ICD-10-CM | POA: Diagnosis not present

## 2015-10-19 DIAGNOSIS — Z800 Family history of malignant neoplasm of digestive organs: Secondary | ICD-10-CM | POA: Diagnosis not present

## 2016-02-15 DIAGNOSIS — L578 Other skin changes due to chronic exposure to nonionizing radiation: Secondary | ICD-10-CM | POA: Diagnosis not present

## 2016-02-15 DIAGNOSIS — L820 Inflamed seborrheic keratosis: Secondary | ICD-10-CM | POA: Diagnosis not present

## 2016-02-15 DIAGNOSIS — L821 Other seborrheic keratosis: Secondary | ICD-10-CM | POA: Diagnosis not present

## 2016-02-15 DIAGNOSIS — L304 Erythema intertrigo: Secondary | ICD-10-CM | POA: Diagnosis not present

## 2016-02-15 DIAGNOSIS — L570 Actinic keratosis: Secondary | ICD-10-CM | POA: Diagnosis not present

## 2016-03-28 DIAGNOSIS — C61 Malignant neoplasm of prostate: Secondary | ICD-10-CM | POA: Diagnosis not present

## 2016-04-04 DIAGNOSIS — N393 Stress incontinence (female) (male): Secondary | ICD-10-CM | POA: Diagnosis not present

## 2016-04-04 DIAGNOSIS — C61 Malignant neoplasm of prostate: Secondary | ICD-10-CM | POA: Diagnosis not present

## 2016-04-04 DIAGNOSIS — N5201 Erectile dysfunction due to arterial insufficiency: Secondary | ICD-10-CM | POA: Diagnosis not present

## 2016-05-03 ENCOUNTER — Ambulatory Visit (INDEPENDENT_AMBULATORY_CARE_PROVIDER_SITE_OTHER): Payer: Medicare Other | Admitting: Sports Medicine

## 2016-05-03 ENCOUNTER — Encounter: Payer: Self-pay | Admitting: Sports Medicine

## 2016-05-03 ENCOUNTER — Ambulatory Visit (INDEPENDENT_AMBULATORY_CARE_PROVIDER_SITE_OTHER): Payer: Medicare Other

## 2016-05-03 DIAGNOSIS — M6528 Calcific tendinitis, other site: Secondary | ICD-10-CM | POA: Diagnosis not present

## 2016-05-03 DIAGNOSIS — M79672 Pain in left foot: Secondary | ICD-10-CM | POA: Diagnosis not present

## 2016-05-03 MED ORDER — TRIAMCINOLONE ACETONIDE 10 MG/ML IJ SUSP: 10.0000 mg | Freq: Once | INTRAMUSCULAR | Status: DC

## 2016-05-03 NOTE — Patient Instructions (Signed)

## 2016-05-03 NOTE — Progress Notes (Signed)
Subjective: Shauntez Constante is a 75 y.o. male patient who presents to office for evaluation of Left heel pain. Patient complains of progressive pain especially over the last 3 months with a large bump at the back of the heel. Reports he had issues with this in the past and saw a Ortho doctor and went to PT. 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 Patient denies any other pedal complaints.   Patient Active Problem List   Diagnosis Date Noted  . Basal cell carcinoma of nose 08/06/2014    Current Outpatient Prescriptions on File Prior to Visit  Medication Sig Dispense Refill  . esomeprazole (NEXIUM) 40 MG capsule Take 40 mg by mouth daily before breakfast.    . levothyroxine (SYNTHROID, LEVOTHROID) 150 MCG tablet Take 150 mcg by mouth daily before breakfast.     No current facility-administered medications on file prior to visit.     Allergies  Allergen Reactions  . Iodine Rash  . Iodine-131 Rash  . Ivp Dye [Iodinated Diagnostic Agents] Itching and Rash    Objective:  General: Alert and oriented x3 in no acute distress  Dermatology: No open lesions bilateral lower extremities, no webspace macerations, no ecchymosis bilateral, all nails x 10 are well manicured.  Vascular: Dorsalis Pedis and Posterior Tibial pedal pulses 1/4, Capillary Fill Time 3 seconds, + pedal hair growth bilateral, + varicosities, no edema bilateral lower extremities, Temperature gradient within normal limits.  Neurology: Gross sensation intact via light touch bilateral. - Tinels sign left foot.   Musculoskeletal: Mild tenderness with palpation at insertion of the Achilles at medial aspect on left, there is calcaneal exostosis with mild soft tissue fullness present and decreased ankle rom with knee extending  vs flexed resembling gastroc equnius bilateral, The achilles tendon feels intact with no nodularity or  palpable dell, Thompson sign negative, Subtalar and midtarsal joint range of motion is within normal limits, there is no 1st ray hypermobility or symptomatic forefoot deformity noted bilateral.   Xrays  Left Foot    Impression: Normal osseous mineralization. Joint spaces preserved except at ankle and midtarsal joint where there is narrowing and spurring suggestive of osteoarthritis. No fracture/dislocation/boney destruction. Calcaneal spur present with extension into insertion of tendon suggestive of calcific changes at achilles insertion. Kager's triangle intact with no obliteration. No soft tissue abnormalities or radiopaque foreign bodies.   Assessment and Plan: Problem List Items Addressed This Visit    None    Visit Diagnoses    Left foot pain    -  Primary   Relevant Medications   triamcinolone acetonide (KENALOG) 10 MG/ML injection 10 mg (Start on 05/03/2016 11:30 AM)   Other Relevant Orders   DG Foot 2 Views Left   Calcific Achilles tendinitis       Relevant Medications   triamcinolone acetonide (KENALOG) 10 MG/ML injection 10 mg (Start on 05/03/2016 11:30 AM)      -Complete examination performed -Xrays reviewed -Discussed treatement options for pain at medial insertion of achilles on left -After oral consent and aseptic prep, injected a mixture containing 1 ml of 2% plain lidocaine, 1 ml 0.5% plain marcaine, 0.5 ml of kenalog 10 and 0.5 ml of dexamethasone phosphate at medial insertion of achilles with care to avoid violating the tendon, without complication. Post-injection care discussed with patient.  -Recommend icing to left heel daily -Dispensed heel lifts to protect achilles to wear  daily in good supportive shoes -Recommend gentle stretching as instructed -Recommend refrain from strenuous activity, hills, steps, excessive driving; may need rest every 30 mins until heel pain is resolved  -If No improvement will consider MRI/PT/EPAT -Patient to return to office in 1 month or  sooner if condition worsens.  Landis Martins, DPM

## 2016-05-17 DIAGNOSIS — N453 Epididymo-orchitis: Secondary | ICD-10-CM | POA: Diagnosis not present

## 2016-05-25 DIAGNOSIS — H2513 Age-related nuclear cataract, bilateral: Secondary | ICD-10-CM | POA: Diagnosis not present

## 2016-05-25 DIAGNOSIS — H524 Presbyopia: Secondary | ICD-10-CM | POA: Diagnosis not present

## 2016-06-01 ENCOUNTER — Ambulatory Visit (INDEPENDENT_AMBULATORY_CARE_PROVIDER_SITE_OTHER): Payer: Medicare Other | Admitting: Sports Medicine

## 2016-06-01 ENCOUNTER — Encounter: Payer: Self-pay | Admitting: Sports Medicine

## 2016-06-01 DIAGNOSIS — M79672 Pain in left foot: Secondary | ICD-10-CM | POA: Diagnosis not present

## 2016-06-01 DIAGNOSIS — M6528 Calcific tendinitis, other site: Secondary | ICD-10-CM | POA: Diagnosis not present

## 2016-06-01 NOTE — Progress Notes (Signed)
Subjective: Robert Clarke is a 75 y.o. male patient who returns to office for evaluation of Left heel pain. Patient states that injection helped had no pain and then did a long drive and started to feel a little pain, now 1/10. States feels best with heel lifts. Patient denies any other pedal complaints.   Patient Active Problem List   Diagnosis Date Noted  . Basal cell carcinoma of nose 08/06/2014    Current Outpatient Prescriptions on File Prior to Visit  Medication Sig Dispense Refill  . aspirin EC 325 MG tablet Take 325 mg by mouth.    . esomeprazole (NEXIUM) 40 MG capsule Take 40 mg by mouth daily before breakfast.    . fluticasone (FLONASE) 50 MCG/ACT nasal spray     . levothyroxine (SYNTHROID, LEVOTHROID) 137 MCG tablet     . levothyroxine (SYNTHROID, LEVOTHROID) 137 MCG tablet     . levothyroxine (SYNTHROID, LEVOTHROID) 150 MCG tablet Take 150 mcg by mouth daily before breakfast.    . omeprazole (PRILOSEC) 40 MG capsule     . omeprazole (PRILOSEC) 40 MG capsule      Current Facility-Administered Medications on File Prior to Visit  Medication Dose Route Frequency Provider Last Rate Last Dose  . triamcinolone acetonide (KENALOG) 10 MG/ML injection 10 mg  10 mg Other Once Landis Martins, DPM        Allergies  Allergen Reactions  . Iodine Rash  . Iodine-131 Rash  . Ivp Dye [Iodinated Diagnostic Agents] Itching and Rash    Objective:  General: Alert and oriented x3 in no acute distress  Dermatology: No open lesions bilateral lower extremities, no webspace macerations, no ecchymosis bilateral, all nails x 10 are well manicured.  Vascular: Dorsalis Pedis and Posterior Tibial pedal pulses 1/4, Capillary Fill Time 3 seconds, + pedal hair growth bilateral, + varicosities, no edema bilateral lower extremities, Temperature gradient within normal limits.  Neurology: Gross sensation intact via light touch bilateral. - Tinels sign left foot.   Musculoskeletal: Minimal tenderness with  palpation at insertion of the Achilles at medial>lateral aspect on left, there is calcaneal exostosis with mild soft tissue fullness present and decreased ankle rom with knee extending  vs flexed resembling gastroc equnius bilateral, The achilles tendon feels intact with no nodularity or palpable dell, Thompson sign negative, Subtalar and midtarsal joint range of motion is within normal limits, there is no 1st ray hypermobility or symptomatic forefoot deformity noted bilateral.   Assessment and Plan: Problem List Items Addressed This Visit    None    Visit Diagnoses    Calcific Achilles tendinitis    -  Primary   Left foot pain          -Complete examination performed -Discussed treatement options for pain at medial insertion of achilles on left -No injection since symptoms have improved -Recommend continue icing to left heel daily -Cont with heel lifts to protect achilles to wear daily in good supportive shoes -Recommend cont gentle stretching as instructed -Recommend refrain from strenuous activity, hills, steps, excessive driving; may need rest every 30 mins until heel pain is resolved  -If recurs will consider MRI/PT/EPAT -Patient to return to office as needed or sooner if condition worsens.  Landis Martins, DPM

## 2016-08-15 DIAGNOSIS — H2512 Age-related nuclear cataract, left eye: Secondary | ICD-10-CM | POA: Diagnosis not present

## 2016-08-15 DIAGNOSIS — H02839 Dermatochalasis of unspecified eye, unspecified eyelid: Secondary | ICD-10-CM | POA: Diagnosis not present

## 2016-08-15 DIAGNOSIS — H2513 Age-related nuclear cataract, bilateral: Secondary | ICD-10-CM | POA: Diagnosis not present

## 2016-08-15 DIAGNOSIS — H25013 Cortical age-related cataract, bilateral: Secondary | ICD-10-CM | POA: Diagnosis not present

## 2016-08-31 DIAGNOSIS — E7800 Pure hypercholesterolemia, unspecified: Secondary | ICD-10-CM | POA: Diagnosis not present

## 2016-08-31 DIAGNOSIS — E039 Hypothyroidism, unspecified: Secondary | ICD-10-CM | POA: Diagnosis not present

## 2016-08-31 DIAGNOSIS — R030 Elevated blood-pressure reading, without diagnosis of hypertension: Secondary | ICD-10-CM | POA: Diagnosis not present

## 2016-09-01 DIAGNOSIS — H2513 Age-related nuclear cataract, bilateral: Secondary | ICD-10-CM | POA: Diagnosis not present

## 2016-09-01 DIAGNOSIS — H2511 Age-related nuclear cataract, right eye: Secondary | ICD-10-CM | POA: Diagnosis not present

## 2016-09-01 DIAGNOSIS — H2512 Age-related nuclear cataract, left eye: Secondary | ICD-10-CM | POA: Diagnosis not present

## 2016-09-01 DIAGNOSIS — H25812 Combined forms of age-related cataract, left eye: Secondary | ICD-10-CM | POA: Diagnosis not present

## 2016-09-15 DIAGNOSIS — H2513 Age-related nuclear cataract, bilateral: Secondary | ICD-10-CM | POA: Diagnosis not present

## 2016-09-15 DIAGNOSIS — H25811 Combined forms of age-related cataract, right eye: Secondary | ICD-10-CM | POA: Diagnosis not present

## 2016-09-15 DIAGNOSIS — H2511 Age-related nuclear cataract, right eye: Secondary | ICD-10-CM | POA: Diagnosis not present

## 2016-10-11 DIAGNOSIS — I10 Essential (primary) hypertension: Secondary | ICD-10-CM | POA: Diagnosis not present

## 2016-10-11 DIAGNOSIS — E039 Hypothyroidism, unspecified: Secondary | ICD-10-CM | POA: Diagnosis not present

## 2016-10-12 DIAGNOSIS — L821 Other seborrheic keratosis: Secondary | ICD-10-CM | POA: Diagnosis not present

## 2016-10-12 DIAGNOSIS — D1801 Hemangioma of skin and subcutaneous tissue: Secondary | ICD-10-CM | POA: Diagnosis not present

## 2016-10-12 DIAGNOSIS — L570 Actinic keratosis: Secondary | ICD-10-CM | POA: Diagnosis not present

## 2016-10-26 ENCOUNTER — Encounter: Payer: Self-pay | Admitting: Sports Medicine

## 2016-10-26 ENCOUNTER — Ambulatory Visit (INDEPENDENT_AMBULATORY_CARE_PROVIDER_SITE_OTHER): Payer: Medicare Other | Admitting: Sports Medicine

## 2016-10-26 DIAGNOSIS — M6528 Calcific tendinitis, other site: Secondary | ICD-10-CM

## 2016-10-26 DIAGNOSIS — M79672 Pain in left foot: Secondary | ICD-10-CM

## 2016-10-26 MED ORDER — METHYLPREDNISOLONE 4 MG PO TBPK: ORAL_TABLET | ORAL | 0 refills | Status: DC

## 2016-10-26 NOTE — Progress Notes (Signed)
Subjective: Robert Clarke is a 76 y.o. male patient who returns to office for evaluation of Left heel pain. Patient states that 4 days ago felt a pull and had pain that is present with each step at back of heel. States icing helps. Patient denies any other pedal complaints.   Patient Active Problem List   Diagnosis Date Noted  . Basal cell carcinoma of nose 08/06/2014    Current Outpatient Prescriptions on File Prior to Visit  Medication Sig Dispense Refill  . aspirin EC 325 MG tablet Take 325 mg by mouth.    . esomeprazole (NEXIUM) 40 MG capsule Take 40 mg by mouth daily before breakfast.    . fluticasone (FLONASE) 50 MCG/ACT nasal spray     . levofloxacin (LEVAQUIN) 500 MG tablet     . levothyroxine (SYNTHROID, LEVOTHROID) 137 MCG tablet     . levothyroxine (SYNTHROID, LEVOTHROID) 137 MCG tablet     . levothyroxine (SYNTHROID, LEVOTHROID) 150 MCG tablet Take 150 mcg by mouth daily before breakfast.    . meloxicam (MOBIC) 7.5 MG tablet     . omeprazole (PRILOSEC) 40 MG capsule     . omeprazole (PRILOSEC) 40 MG capsule      Current Facility-Administered Medications on File Prior to Visit  Medication Dose Route Frequency Provider Last Rate Last Dose  . triamcinolone acetonide (KENALOG) 10 MG/ML injection 10 mg  10 mg Other Once Landis Martins, DPM        Allergies  Allergen Reactions  . Iodine Rash  . Iodine-131 Rash  . Ivp Dye [Iodinated Diagnostic Agents] Itching and Rash    Objective:  General: Alert and oriented x3 in no acute distress  Dermatology: No open lesions bilateral lower extremities, no webspace macerations, no ecchymosis bilateral, all nails x 10 are well manicured.  Vascular: Dorsalis Pedis and Posterior Tibial pedal pulses 1/4, Capillary Fill Time 3 seconds, + pedal hair growth bilateral, + varicosities, no edema bilateral lower extremities, Temperature gradient within normal limits.  Neurology: Gross sensation intact via light touch bilateral. - Tinels sign  left foot.   Musculoskeletal: No tenderness with palpation at insertion of the Achilles at on left however there is pain at watershed area with swelling, there is calcaneal exostosis with mild soft tissue fullness present and decreased ankle rom with knee extending  vs flexed resembling gastroc equnius bilateral, The achilles tendon feels intact with no nodularity or palpable dell, Thompson sign negative, Subtalar and midtarsal joint range of motion is within normal limits, there is no 1st ray hypermobility or symptomatic forefoot deformity noted bilateral.   Assessment and Plan: Problem List Items Addressed This Visit    None    Visit Diagnoses    Calcific Achilles tendinitis    -  Primary   Relevant Medications   methylPREDNISolone (MEDROL DOSEPAK) 4 MG TBPK tablet   Left foot pain       Relevant Medications   methylPREDNISolone (MEDROL DOSEPAK) 4 MG TBPK tablet      -Complete examination performed -Discussed treatement options for pain at watershed area of achilles on left -Rx medrol dose pak -Dispensed cam boot to wear at all times -Recommend continue icing to left heel daily -Recommend MRI to eval for partial tear however patient wants to hold off if not better after he get back from out of town; Patient to call office and we will order MRI when he is back if symptoms are not better -Patient to return to office once MRI is done or sooner  if condition worsens.  Landis Martins, DPM

## 2016-11-05 DIAGNOSIS — I10 Essential (primary) hypertension: Secondary | ICD-10-CM | POA: Diagnosis not present

## 2016-11-05 DIAGNOSIS — J301 Allergic rhinitis due to pollen: Secondary | ICD-10-CM | POA: Diagnosis not present

## 2016-11-09 ENCOUNTER — Telehealth: Payer: Self-pay | Admitting: Sports Medicine

## 2016-11-09 DIAGNOSIS — M6528 Calcific tendinitis, other site: Secondary | ICD-10-CM

## 2016-11-09 DIAGNOSIS — M79672 Pain in left foot: Secondary | ICD-10-CM

## 2016-11-09 NOTE — Telephone Encounter (Signed)
Pt was suppose to be set up for MRI because hes still having pain.

## 2016-11-09 NOTE — Telephone Encounter (Signed)
Rx MRI left ankle evaluate achilles tendon for partial tear

## 2016-11-10 NOTE — Addendum Note (Signed)
Addended by: Harriett Sine D on: 11/10/2016 05:27 PM   Modules accepted: Orders

## 2016-11-13 NOTE — Telephone Encounter (Addendum)
MRI left ankle faxed to [facility]. Left message on pt's work and home phone with appt time 11/16/2016 at 2:30pm and arrive 2:15pm, [facility] [phone] if change needed to be made.

## 2016-11-14 DIAGNOSIS — J0100 Acute maxillary sinusitis, unspecified: Secondary | ICD-10-CM | POA: Diagnosis not present

## 2016-11-14 DIAGNOSIS — H6692 Otitis media, unspecified, left ear: Secondary | ICD-10-CM | POA: Diagnosis not present

## 2016-11-17 DIAGNOSIS — M25572 Pain in left ankle and joints of left foot: Secondary | ICD-10-CM | POA: Diagnosis not present

## 2016-11-17 DIAGNOSIS — S86012A Strain of left Achilles tendon, initial encounter: Secondary | ICD-10-CM | POA: Diagnosis not present

## 2016-11-17 DIAGNOSIS — M6528 Calcific tendinitis, other site: Secondary | ICD-10-CM | POA: Diagnosis not present

## 2016-11-21 ENCOUNTER — Telehealth: Payer: Self-pay | Admitting: Sports Medicine

## 2016-11-21 NOTE — Telephone Encounter (Addendum)
I spoke with April - [facility] and asked to have the MRI results faxed to [phone]. She states she will print and send immediately. Received fax from [facility] with results. I reviewed MRI and informed pt the MRI was available and Dr. Cannon Kettle wanted him to make an appt to be seen, and to go back into the boot.

## 2016-11-21 NOTE — Telephone Encounter (Signed)
Pt calling for mri results.

## 2016-11-23 ENCOUNTER — Ambulatory Visit (INDEPENDENT_AMBULATORY_CARE_PROVIDER_SITE_OTHER): Payer: Medicare Other | Admitting: Sports Medicine

## 2016-11-23 DIAGNOSIS — M79672 Pain in left foot: Secondary | ICD-10-CM | POA: Diagnosis not present

## 2016-11-23 DIAGNOSIS — S86012A Strain of left Achilles tendon, initial encounter: Secondary | ICD-10-CM

## 2016-11-23 NOTE — Progress Notes (Signed)
Subjective: Robert Clarke is a 76 y.o. male patient who returns to office for evaluation of Left heel pain and for discussion of MRI results. Patient reports that he tried to use the CAM boot but it caused more pain with flexing his foot. Patient states that occasionally he feels a pull at the back of the heel. Patient denies any other pedal complaints.   Patient Active Problem List   Diagnosis Date Noted  . Basal cell carcinoma of nose 08/06/2014    Current Outpatient Prescriptions on File Prior to Visit  Medication Sig Dispense Refill  . aspirin EC 325 MG tablet Take 325 mg by mouth.    . esomeprazole (NEXIUM) 40 MG capsule Take 40 mg by mouth daily before breakfast.    . fluticasone (FLONASE) 50 MCG/ACT nasal spray     . levofloxacin (LEVAQUIN) 500 MG tablet     . levothyroxine (SYNTHROID, LEVOTHROID) 137 MCG tablet     . levothyroxine (SYNTHROID, LEVOTHROID) 137 MCG tablet     . levothyroxine (SYNTHROID, LEVOTHROID) 150 MCG tablet Take 150 mcg by mouth daily before breakfast.    . meloxicam (MOBIC) 7.5 MG tablet     . methylPREDNISolone (MEDROL DOSEPAK) 4 MG TBPK tablet Take as instructed 21 tablet 0  . omeprazole (PRILOSEC) 40 MG capsule     . omeprazole (PRILOSEC) 40 MG capsule      Current Facility-Administered Medications on File Prior to Visit  Medication Dose Route Frequency Provider Last Rate Last Dose  . triamcinolone acetonide (KENALOG) 10 MG/ML injection 10 mg  10 mg Other Once Landis Martins, DPM        Allergies  Allergen Reactions  . Iodine Rash  . Iodine-131 Rash  . Ivp Dye [Iodinated Diagnostic Agents] Itching and Rash    Objective:  General: Alert and oriented x3 in no acute distress  Dermatology: No open lesions bilateral lower extremities, no webspace macerations, no ecchymosis bilateral, all nails x 10 are well manicured.  Vascular: Dorsalis Pedis and Posterior Tibial pedal pulses 1/4, Capillary Fill Time 3 seconds, + pedal hair growth bilateral, +  varicosities, no edema bilateral lower extremities, Temperature gradient within normal limits.  Neurology: Gross sensation intact via light touch bilateral. - Tinels sign left foot.   Musculoskeletal: No tenderness with palpation at insertion of the Achilles at on left however there is pain at watershed area with swelling, there is calcaneal exostosis with mild soft tissue fullness present and decreased ankle rom with knee extending  vs flexed resembling gastroc equnius bilateral, The achilles tendon feels intact with no nodularity or palpable dell, Thompson sign negative, Subtalar and midtarsal joint range of motion is within normal limits, there is no 1st ray hypermobility or symptomatic forefoot deformity noted bilateral.   11-17-16 MRI IMPRESSION: 1. High-grade partial-thickness tear of the Achilles tendon at its insertion with 2.2 cm of retraction. 2. Mild tendinosis with a short-segment longitudinal split tear of the peroneus brevis just distal to the lateral malleolus.  Assessment and Plan: Problem List Items Addressed This Visit    None    Visit Diagnoses    Achilles tendon tear, left, initial encounter    -  Primary   Left foot pain          -Complete examination performed -Discussed treatement options for partial achilles tear  -Patient elects to continue with conservative care at this time -Dispensed heel lifts to use with cam boot to wear at all times; Advised 10-12 weeks with conservative care and possible  PT afterwards  -Recommend continue icing to left heel daily -Recommend no work  -Patient to return to office in 3 weeks or sooner if condition worsens.  Landis Martins, DPM

## 2016-12-15 ENCOUNTER — Ambulatory Visit: Payer: Medicare Other | Admitting: Sports Medicine

## 2016-12-22 ENCOUNTER — Ambulatory Visit (INDEPENDENT_AMBULATORY_CARE_PROVIDER_SITE_OTHER): Payer: Medicare Other | Admitting: Sports Medicine

## 2016-12-22 ENCOUNTER — Encounter: Payer: Self-pay | Admitting: Sports Medicine

## 2016-12-22 DIAGNOSIS — M79672 Pain in left foot: Secondary | ICD-10-CM

## 2016-12-22 DIAGNOSIS — R609 Edema, unspecified: Secondary | ICD-10-CM

## 2016-12-22 DIAGNOSIS — S86012D Strain of left Achilles tendon, subsequent encounter: Secondary | ICD-10-CM

## 2016-12-22 MED ORDER — DICLOFENAC SODIUM 1 % TD GEL: 4.0000 g | Freq: Four times a day (QID) | TRANSDERMAL | 1 refills | Status: DC

## 2016-12-22 NOTE — Progress Notes (Signed)
Subjective: Robert Clarke is a 76 y.o. male patient who returns to office for evaluation of Left heel pain secondary to partial Achilles tear. Patient states that he is doing okay with CAM boot had an episode where he tripped and had a flare in pain 2 days ago, however, he feels much better. Patient denies any other pedal complaints.   Patient Active Problem List   Diagnosis Date Noted  . Basal cell carcinoma of nose 08/06/2014    Current Outpatient Prescriptions on File Prior to Visit  Medication Sig Dispense Refill  . aspirin EC 325 MG tablet Take 325 mg by mouth.    . esomeprazole (NEXIUM) 40 MG capsule Take 40 mg by mouth daily before breakfast.    . fluticasone (FLONASE) 50 MCG/ACT nasal spray     . levofloxacin (LEVAQUIN) 500 MG tablet     . levothyroxine (SYNTHROID, LEVOTHROID) 137 MCG tablet     . levothyroxine (SYNTHROID, LEVOTHROID) 137 MCG tablet     . levothyroxine (SYNTHROID, LEVOTHROID) 150 MCG tablet Take 150 mcg by mouth daily before breakfast.    . meloxicam (MOBIC) 7.5 MG tablet     . methylPREDNISolone (MEDROL DOSEPAK) 4 MG TBPK tablet Take as instructed 21 tablet 0  . omeprazole (PRILOSEC) 40 MG capsule     . omeprazole (PRILOSEC) 40 MG capsule      Current Facility-Administered Medications on File Prior to Visit  Medication Dose Route Frequency Provider Last Rate Last Dose  . triamcinolone acetonide (KENALOG) 10 MG/ML injection 10 mg  10 mg Other Once Landis Martins, DPM        Allergies  Allergen Reactions  . Iodine Rash  . Iodine-131 Rash  . Ivp Dye [Iodinated Diagnostic Agents] Itching and Rash    Objective:  General: Alert and oriented x3 in no acute distress  Dermatology: No open lesions bilateral lower extremities, no webspace macerations, no ecchymosis bilateral, all nails x 10 are well manicured.  Vascular: Dorsalis Pedis and Posterior Tibial pedal pulses 1/4, Capillary Fill Time 3 seconds, + pedal hair growth bilateral, + varicosities, no edema  bilateral lower extremities, Temperature gradient within normal limits.  Neurology: Gross sensation intact via light touch bilateral. - Tinels sign left foot.   Musculoskeletal: No tenderness with palpation at insertion of the Achilles at on left however there is pain at watershed area with swelling, there is calcaneal exostosis with mild soft tissue fullness present and decreased ankle rom with knee extending  vs flexed resembling gastroc equnius bilateral, The achilles tendon feels intact with no nodularity or palpable dell on the lateral portion of the tendon. However, there is a small Saif on the medial portion of the tendon, Thompson sign negative, Subtalar and midtarsal joint range of motion is within normal limits, there is no 1st ray hypermobility or symptomatic forefoot deformity noted bilateral.   Assessment and Plan: Problem List Items Addressed This Visit    None    Visit Diagnoses    Partial Achilles tendon tear, left, subsequent encounter    -  Primary   Relevant Medications   diclofenac sodium (VOLTAREN) 1 % GEL   Left foot pain       Relevant Medications   diclofenac sodium (VOLTAREN) 1 % GEL   Swelling       Relevant Medications   diclofenac sodium (VOLTAREN) 1 % GEL      -Complete examination performed -Discussed treatement options for partial achilles tear  -Patient elects to continue with conservative care at this time -  Re-Dispensed heel lifts to use with cam boot to wear at all times; Advised 10-12 weeks with conservative care and possible PT afterwards  -Dispensed Surgi-tube compression socks to wear daily to assist with edema control -Recommend continue icing to left heel daily -Recommend no work  -Patient to return to office in 4 weeks or sooner if condition worsens.  Landis Martins, DPM

## 2017-01-16 DIAGNOSIS — J0100 Acute maxillary sinusitis, unspecified: Secondary | ICD-10-CM | POA: Diagnosis not present

## 2017-01-22 DIAGNOSIS — J0100 Acute maxillary sinusitis, unspecified: Secondary | ICD-10-CM | POA: Diagnosis not present

## 2017-01-25 ENCOUNTER — Ambulatory Visit (INDEPENDENT_AMBULATORY_CARE_PROVIDER_SITE_OTHER): Payer: Medicare Other | Admitting: Sports Medicine

## 2017-01-25 DIAGNOSIS — R609 Edema, unspecified: Secondary | ICD-10-CM

## 2017-01-25 DIAGNOSIS — M79672 Pain in left foot: Secondary | ICD-10-CM | POA: Diagnosis not present

## 2017-01-25 DIAGNOSIS — S86012D Strain of left Achilles tendon, subsequent encounter: Secondary | ICD-10-CM | POA: Diagnosis not present

## 2017-01-25 NOTE — Patient Instructions (Signed)
SOFT CAST/ UNNA BOOT INSTRUCTIONS  Unna boot need to be worn for 5 days for maximum benefit.  If you next appointment is before 5 days, please remove prior to coming in.  It is important that you wear sturdy walking boot at all times when walking.    **If at any time while wearing the unna boot you should notice any irritation such as a rash, redness, or itching, remove the boot and wash your foot/feet thoroughly.  BATHING INSTRUCTIONS  Keep soft cast clean and dry. If wet pat dry and blow dry with hair dryer and call office for further instructions.

## 2017-01-25 NOTE — Progress Notes (Signed)
Subjective: Robert Clarke is a 76 y.o. male patient who returns to office for evaluation of Left heel pain secondary to partial Achilles tear. Patient states that he is wearing the boot and surgical stocking states that last week he stepped wrong and had a little pain and swelling. Patient denies any other pedal complaints.   Patient Active Problem List   Diagnosis Date Noted  . Basal cell carcinoma of nose 08/06/2014    Current Outpatient Prescriptions on File Prior to Visit  Medication Sig Dispense Refill  . aspirin EC 325 MG tablet Take 325 mg by mouth.    . diclofenac sodium (VOLTAREN) 1 % GEL Apply 4 g topically 4 (four) times daily. 100 g 1  . esomeprazole (NEXIUM) 40 MG capsule Take 40 mg by mouth daily before breakfast.    . fluticasone (FLONASE) 50 MCG/ACT nasal spray     . levofloxacin (LEVAQUIN) 500 MG tablet     . levothyroxine (SYNTHROID, LEVOTHROID) 137 MCG tablet     . levothyroxine (SYNTHROID, LEVOTHROID) 137 MCG tablet     . levothyroxine (SYNTHROID, LEVOTHROID) 150 MCG tablet Take 150 mcg by mouth daily before breakfast.    . meloxicam (MOBIC) 7.5 MG tablet     . methylPREDNISolone (MEDROL DOSEPAK) 4 MG TBPK tablet Take as instructed 21 tablet 0  . omeprazole (PRILOSEC) 40 MG capsule     . omeprazole (PRILOSEC) 40 MG capsule      Current Facility-Administered Medications on File Prior to Visit  Medication Dose Route Frequency Provider Last Rate Last Dose  . triamcinolone acetonide (KENALOG) 10 MG/ML injection 10 mg  10 mg Other Once Landis Martins, DPM        Allergies  Allergen Reactions  . Iodine Rash  . Iodine-131 Rash  . Ivp Dye [Iodinated Diagnostic Agents] Itching and Rash    Objective:  General: Alert and oriented x3 in no acute distress  Dermatology: No open lesions bilateral lower extremities, no webspace macerations, no ecchymosis bilateral, all nails x 10 are well manicured.  Vascular: Dorsalis Pedis and Posterior Tibial pedal pulses 1/4, Capillary  Fill Time 3 seconds, + pedal hair growth bilateral, + varicosities, + edema left foot and ankle, Temperature gradient within normal limits.  Neurology: Gross sensation intact via light touch bilateral. - Tinels sign left foot.   Musculoskeletal: No tenderness with palpation at insertion of the Achilles at on left however there is minimal pain at watershed area with swelling, there is calcaneal exostosis with mild soft tissue fullness present and decreased ankle rom with knee extending  vs flexed resembling gastroc equnius bilateral, The achilles tendon feels intact with no nodularity or palpable dell on the lateral portion of the tendon. However, there is a larger Kionte on the with 75%of the tendon involved suggestive of advancing tear, Thompson sign negative, Subtalar and midtarsal joint range of motion is within normal limits, there is no 1st ray hypermobility or symptomatic forefoot deformity noted bilateral.   Assessment and Plan: Problem List Items Addressed This Visit    None    Visit Diagnoses    Partial Achilles tendon tear, left, subsequent encounter    -  Primary   Left foot pain       Swelling          -Complete examination performed -Discussed treatement options for partial achilles tear  -Patient elects to continue with conservative care at this time despite my recommendation of surgery for advancing tear; patient states that he can not take time  off work to recover  -Production assistant, radio to Left foot and ankle with instructions to keep clean, dry, and intact for 5 days then return to using surgitube compression sock  -Continue with heel lifts to use with cam boot to wear at all times; Advised total 10-12 weeks with conservative care and possible PT afterwards  -Recommend continue icing to left heel daily -Recommend no work however patient continues  -Patient to return to office in 3-4 weeks or sooner if condition worsens.  Landis Martins, DPM

## 2017-02-16 ENCOUNTER — Ambulatory Visit: Payer: Medicare Other | Admitting: Sports Medicine

## 2017-03-02 DIAGNOSIS — C61 Malignant neoplasm of prostate: Secondary | ICD-10-CM | POA: Diagnosis not present

## 2017-03-02 DIAGNOSIS — E039 Hypothyroidism, unspecified: Secondary | ICD-10-CM | POA: Diagnosis not present

## 2017-03-02 DIAGNOSIS — E7800 Pure hypercholesterolemia, unspecified: Secondary | ICD-10-CM | POA: Diagnosis not present

## 2017-03-02 DIAGNOSIS — Z1389 Encounter for screening for other disorder: Secondary | ICD-10-CM | POA: Diagnosis not present

## 2017-03-02 DIAGNOSIS — I10 Essential (primary) hypertension: Secondary | ICD-10-CM | POA: Diagnosis not present

## 2017-03-02 DIAGNOSIS — Z0000 Encounter for general adult medical examination without abnormal findings: Secondary | ICD-10-CM | POA: Diagnosis not present

## 2017-03-16 ENCOUNTER — Ambulatory Visit (INDEPENDENT_AMBULATORY_CARE_PROVIDER_SITE_OTHER): Payer: Medicare Other | Admitting: Sports Medicine

## 2017-03-16 DIAGNOSIS — S86012D Strain of left Achilles tendon, subsequent encounter: Secondary | ICD-10-CM

## 2017-03-16 DIAGNOSIS — R609 Edema, unspecified: Secondary | ICD-10-CM | POA: Diagnosis not present

## 2017-03-16 DIAGNOSIS — M79672 Pain in left foot: Secondary | ICD-10-CM

## 2017-03-17 NOTE — Progress Notes (Signed)
Subjective: Generoso Cropper is a 76 y.o. male patient who returns to office for evaluation of Left heel pain secondary to partial Achilles tear that has been healing via conservative care. Patient states that pain is gone and that still deals with swelling; Reports that his boot is worn out. Patient denies any other pedal complaints.   Patient Active Problem List   Diagnosis Date Noted  . Basal cell carcinoma of nose 08/06/2014    Current Outpatient Prescriptions on File Prior to Visit  Medication Sig Dispense Refill  . aspirin EC 325 MG tablet Take 325 mg by mouth.    . diclofenac sodium (VOLTAREN) 1 % GEL Apply 4 g topically 4 (four) times daily. 100 g 1  . esomeprazole (NEXIUM) 40 MG capsule Take 40 mg by mouth daily before breakfast.    . fluticasone (FLONASE) 50 MCG/ACT nasal spray     . levofloxacin (LEVAQUIN) 500 MG tablet     . levothyroxine (SYNTHROID, LEVOTHROID) 137 MCG tablet     . levothyroxine (SYNTHROID, LEVOTHROID) 137 MCG tablet     . levothyroxine (SYNTHROID, LEVOTHROID) 150 MCG tablet Take 150 mcg by mouth daily before breakfast.    . meloxicam (MOBIC) 7.5 MG tablet     . methylPREDNISolone (MEDROL DOSEPAK) 4 MG TBPK tablet Take as instructed 21 tablet 0  . omeprazole (PRILOSEC) 40 MG capsule     . omeprazole (PRILOSEC) 40 MG capsule      Current Facility-Administered Medications on File Prior to Visit  Medication Dose Route Frequency Provider Last Rate Last Dose  . triamcinolone acetonide (KENALOG) 10 MG/ML injection 10 mg  10 mg Other Once Landis Martins, DPM        Allergies  Allergen Reactions  . Iodine Rash  . Iodine-131 Rash  . Ivp Dye [Iodinated Diagnostic Agents] Itching and Rash    Objective:  General: Alert and oriented x3 in no acute distress  Dermatology: No open lesions bilateral lower extremities, no webspace macerations, no ecchymosis bilateral, all nails x 10 are well manicured.  Vascular: Dorsalis Pedis and Posterior Tibial pedal pulses 1/4,  Capillary Fill Time 3 seconds, + pedal hair growth bilateral, + varicosities, + edema left foot and ankle, Temperature gradient within normal limits.  Neurology: Gross sensation intact via light touch bilateral. - Tinels sign left foot.   Musculoskeletal: No tenderness with palpation at insertion of the Achilles at on left, there is calcaneal exostosis with mild soft tissue fullness present and decreased ankle rom with knee extending  vs flexed resembling gastroc equnius bilateral, The achilles tendon feels intact with no nodularity or palpable dell on the lateral portion of the tendon. However there is a palpable dell medially at watershed area that appears to have some scarring, Thompson sign negative, Subtalar and midtarsal joint range of motion is within normal limits, there is no 1st ray hypermobility or symptomatic forefoot deformity noted bilateral.   Assessment and Plan: Problem List Items Addressed This Visit    None    Visit Diagnoses    Partial Achilles tendon tear, left, subsequent encounter    -  Primary   Swelling       Left foot pain       resolved      -Complete examination performed -Discussed treatement options and continued care for partial achilles tear that is slowly improving  -Patient elects to continue with conservative care at this time despite my recommendation of surgery for tear; patient states that he can not take time off  work to recover and understands that risk of further tear and re-rupture is present -Rx PT at [facility] in [place] on [address] for edema reduction and strengthening  -Continue with surgitube compression sock  -Continue with heel lifts to use with cam boot to wear at all times to slowly wean from boot to tennis shoe with heel lifts -Recommend continue icing to left heel daily -Recommend no work however patient continues  -Patient to return to office in 4 weeks after a few sessions of PT for follow up evaluation or sooner if condition  worsens.  Landis Martins, DPM

## 2017-03-27 ENCOUNTER — Telehealth: Payer: Self-pay | Admitting: *Deleted

## 2017-03-27 DIAGNOSIS — M25572 Pain in left ankle and joints of left foot: Secondary | ICD-10-CM | POA: Diagnosis not present

## 2017-03-27 DIAGNOSIS — M25472 Effusion, left ankle: Secondary | ICD-10-CM | POA: Diagnosis not present

## 2017-03-27 DIAGNOSIS — R262 Difficulty in walking, not elsewhere classified: Secondary | ICD-10-CM | POA: Diagnosis not present

## 2017-03-27 DIAGNOSIS — M25672 Stiffness of left ankle, not elsewhere classified: Secondary | ICD-10-CM | POA: Diagnosis not present

## 2017-03-27 NOTE — Telephone Encounter (Signed)
Robert Clarke [facility] needs [facility] rx pt has appt today. [facility]

## 2017-03-29 DIAGNOSIS — M25472 Effusion, left ankle: Secondary | ICD-10-CM | POA: Diagnosis not present

## 2017-03-29 DIAGNOSIS — M25572 Pain in left ankle and joints of left foot: Secondary | ICD-10-CM | POA: Diagnosis not present

## 2017-03-29 DIAGNOSIS — R262 Difficulty in walking, not elsewhere classified: Secondary | ICD-10-CM | POA: Diagnosis not present

## 2017-03-29 DIAGNOSIS — M25672 Stiffness of left ankle, not elsewhere classified: Secondary | ICD-10-CM | POA: Diagnosis not present

## 2017-04-03 DIAGNOSIS — M25472 Effusion, left ankle: Secondary | ICD-10-CM | POA: Diagnosis not present

## 2017-04-03 DIAGNOSIS — M25572 Pain in left ankle and joints of left foot: Secondary | ICD-10-CM | POA: Diagnosis not present

## 2017-04-03 DIAGNOSIS — R262 Difficulty in walking, not elsewhere classified: Secondary | ICD-10-CM | POA: Diagnosis not present

## 2017-04-03 DIAGNOSIS — M25672 Stiffness of left ankle, not elsewhere classified: Secondary | ICD-10-CM | POA: Diagnosis not present

## 2017-04-05 DIAGNOSIS — R262 Difficulty in walking, not elsewhere classified: Secondary | ICD-10-CM | POA: Diagnosis not present

## 2017-04-05 DIAGNOSIS — M25472 Effusion, left ankle: Secondary | ICD-10-CM | POA: Diagnosis not present

## 2017-04-05 DIAGNOSIS — M25572 Pain in left ankle and joints of left foot: Secondary | ICD-10-CM | POA: Diagnosis not present

## 2017-04-05 DIAGNOSIS — M25672 Stiffness of left ankle, not elsewhere classified: Secondary | ICD-10-CM | POA: Diagnosis not present

## 2017-04-10 DIAGNOSIS — R262 Difficulty in walking, not elsewhere classified: Secondary | ICD-10-CM | POA: Diagnosis not present

## 2017-04-10 DIAGNOSIS — M25672 Stiffness of left ankle, not elsewhere classified: Secondary | ICD-10-CM | POA: Diagnosis not present

## 2017-04-10 DIAGNOSIS — M25472 Effusion, left ankle: Secondary | ICD-10-CM | POA: Diagnosis not present

## 2017-04-10 DIAGNOSIS — M25572 Pain in left ankle and joints of left foot: Secondary | ICD-10-CM | POA: Diagnosis not present

## 2017-04-12 DIAGNOSIS — R262 Difficulty in walking, not elsewhere classified: Secondary | ICD-10-CM | POA: Diagnosis not present

## 2017-04-12 DIAGNOSIS — M25472 Effusion, left ankle: Secondary | ICD-10-CM | POA: Diagnosis not present

## 2017-04-12 DIAGNOSIS — M25672 Stiffness of left ankle, not elsewhere classified: Secondary | ICD-10-CM | POA: Diagnosis not present

## 2017-04-12 DIAGNOSIS — M25572 Pain in left ankle and joints of left foot: Secondary | ICD-10-CM | POA: Diagnosis not present

## 2017-04-13 ENCOUNTER — Ambulatory Visit (INDEPENDENT_AMBULATORY_CARE_PROVIDER_SITE_OTHER): Payer: Medicare Other | Admitting: Sports Medicine

## 2017-04-13 DIAGNOSIS — R609 Edema, unspecified: Secondary | ICD-10-CM | POA: Diagnosis not present

## 2017-04-13 DIAGNOSIS — M79672 Pain in left foot: Secondary | ICD-10-CM

## 2017-04-13 DIAGNOSIS — S86012D Strain of left Achilles tendon, subsequent encounter: Secondary | ICD-10-CM

## 2017-04-13 NOTE — Progress Notes (Signed)
Subjective: Robert Clarke is a 76 y.o. male patient who returns to office for evaluation of Left heel pain secondary to partial Achilles tear that has been healing via conservative care. Patient states that pain is gone and that swelling is better; No issues with PT and states that they are suppose to send a report. Admits more pain now with Right knee. Patient denies any other pedal complaints.   Patient Active Problem List   Diagnosis Date Noted  . Basal cell carcinoma of nose 08/06/2014    Current Outpatient Prescriptions on File Prior to Visit  Medication Sig Dispense Refill  . aspirin EC 325 MG tablet Take 325 mg by mouth.    . diclofenac sodium (VOLTAREN) 1 % GEL Apply 4 g topically 4 (four) times daily. 100 g 1  . esomeprazole (NEXIUM) 40 MG capsule Take 40 mg by mouth daily before breakfast.    . fluticasone (FLONASE) 50 MCG/ACT nasal spray     . levofloxacin (LEVAQUIN) 500 MG tablet     . levothyroxine (SYNTHROID, LEVOTHROID) 137 MCG tablet     . levothyroxine (SYNTHROID, LEVOTHROID) 137 MCG tablet     . levothyroxine (SYNTHROID, LEVOTHROID) 150 MCG tablet Take 150 mcg by mouth daily before breakfast.    . meloxicam (MOBIC) 7.5 MG tablet     . methylPREDNISolone (MEDROL DOSEPAK) 4 MG TBPK tablet Take as instructed 21 tablet 0  . omeprazole (PRILOSEC) 40 MG capsule     . omeprazole (PRILOSEC) 40 MG capsule      Current Facility-Administered Medications on File Prior to Visit  Medication Dose Route Frequency Provider Last Rate Last Dose  . triamcinolone acetonide (KENALOG) 10 MG/ML injection 10 mg  10 mg Other Once Landis Martins, DPM        Allergies  Allergen Reactions  . Iodine Rash  . Iodine-131 Rash  . Ivp Dye [Iodinated Diagnostic Agents] Itching and Rash    Objective:  General: Alert and oriented x3 in no acute distress  Dermatology: No open lesions bilateral lower extremities, no webspace macerations, no ecchymosis bilateral, all nails x 10 are well  manicured.  Vascular: Dorsalis Pedis and Posterior Tibial pedal pulses 1/4, Capillary Fill Time 3 seconds, + pedal hair growth bilateral, + varicosities, + decreased edema left foot and ankle, Temperature gradient within normal limits.  Neurology: Gross sensation intact via light touch bilateral. - Tinels sign left foot.   Musculoskeletal: No tenderness with palpation at insertion of the Achilles at on left, there is calcaneal exostosis with mild soft tissue fullness present and decreased ankle rom with knee extending  vs flexed resembling gastroc equnius bilateral, The achilles tendon feels intact with no nodularity or palpable dell on the lateral portion of the tendon. However there is a palpable dell medially at watershed area that appears to have continued scarring, Thompson sign negative, Subtalar and midtarsal joint range of motion is within normal limits, there is no 1st ray hypermobility or symptomatic forefoot deformity noted bilateral. Subjective right knee pain with limited extension.   Assessment and Plan: Problem List Items Addressed This Visit    None    Visit Diagnoses    Partial Achilles tendon tear, left, subsequent encounter    -  Primary   Swelling       Left foot pain         -Complete examination performed -Discussed treatement options and continued care for partial achilles tear that is improved with conservative care -Continue with PT until discharge -Continue with surgitube  compression sock  -Continue with  tennis shoe with heel lifts -Recommend continue icing to left heel daily -Recommend activities to tolerance  -Patient to return to office as needed or sooner if condition worsens. Advised patient to have his right knee check; Recommended [facility].   Landis Martins, DPM

## 2017-04-19 DIAGNOSIS — I10 Essential (primary) hypertension: Secondary | ICD-10-CM | POA: Diagnosis not present

## 2017-05-02 DIAGNOSIS — C61 Malignant neoplasm of prostate: Secondary | ICD-10-CM | POA: Diagnosis not present

## 2017-05-08 DIAGNOSIS — N5201 Erectile dysfunction due to arterial insufficiency: Secondary | ICD-10-CM | POA: Diagnosis not present

## 2017-05-08 DIAGNOSIS — C61 Malignant neoplasm of prostate: Secondary | ICD-10-CM | POA: Diagnosis not present

## 2017-05-08 DIAGNOSIS — N393 Stress incontinence (female) (male): Secondary | ICD-10-CM | POA: Diagnosis not present

## 2017-07-15 DIAGNOSIS — H468 Other optic neuritis: Secondary | ICD-10-CM | POA: Diagnosis not present

## 2017-07-15 DIAGNOSIS — R51 Headache: Secondary | ICD-10-CM | POA: Diagnosis not present

## 2017-08-09 DIAGNOSIS — B009 Herpesviral infection, unspecified: Secondary | ICD-10-CM | POA: Diagnosis not present

## 2017-08-09 DIAGNOSIS — L570 Actinic keratosis: Secondary | ICD-10-CM | POA: Diagnosis not present

## 2017-08-09 DIAGNOSIS — L821 Other seborrheic keratosis: Secondary | ICD-10-CM | POA: Diagnosis not present

## 2017-08-09 DIAGNOSIS — L578 Other skin changes due to chronic exposure to nonionizing radiation: Secondary | ICD-10-CM | POA: Diagnosis not present

## 2017-08-22 DIAGNOSIS — J019 Acute sinusitis, unspecified: Secondary | ICD-10-CM | POA: Diagnosis not present

## 2017-08-22 DIAGNOSIS — J040 Acute laryngitis: Secondary | ICD-10-CM | POA: Diagnosis not present

## 2017-08-28 HISTORY — PX: CATARACT EXTRACTION, BILATERAL: SHX1313

## 2017-10-02 DIAGNOSIS — J111 Influenza due to unidentified influenza virus with other respiratory manifestations: Secondary | ICD-10-CM | POA: Diagnosis not present

## 2018-01-17 DIAGNOSIS — R69 Illness, unspecified: Secondary | ICD-10-CM | POA: Diagnosis not present

## 2018-02-01 DIAGNOSIS — H524 Presbyopia: Secondary | ICD-10-CM | POA: Diagnosis not present

## 2018-03-07 DIAGNOSIS — I10 Essential (primary) hypertension: Secondary | ICD-10-CM | POA: Diagnosis not present

## 2018-03-07 DIAGNOSIS — C61 Malignant neoplasm of prostate: Secondary | ICD-10-CM | POA: Diagnosis not present

## 2018-03-07 DIAGNOSIS — Z0000 Encounter for general adult medical examination without abnormal findings: Secondary | ICD-10-CM | POA: Diagnosis not present

## 2018-03-07 DIAGNOSIS — D126 Benign neoplasm of colon, unspecified: Secondary | ICD-10-CM | POA: Diagnosis not present

## 2018-03-07 DIAGNOSIS — E7800 Pure hypercholesterolemia, unspecified: Secondary | ICD-10-CM | POA: Diagnosis not present

## 2018-03-07 DIAGNOSIS — Z800 Family history of malignant neoplasm of digestive organs: Secondary | ICD-10-CM | POA: Diagnosis not present

## 2018-03-07 DIAGNOSIS — E039 Hypothyroidism, unspecified: Secondary | ICD-10-CM | POA: Diagnosis not present

## 2018-03-07 DIAGNOSIS — Z1389 Encounter for screening for other disorder: Secondary | ICD-10-CM | POA: Diagnosis not present

## 2018-04-18 DIAGNOSIS — L570 Actinic keratosis: Secondary | ICD-10-CM | POA: Diagnosis not present

## 2018-04-18 DIAGNOSIS — C44319 Basal cell carcinoma of skin of other parts of face: Secondary | ICD-10-CM | POA: Diagnosis not present

## 2018-05-03 DIAGNOSIS — M7989 Other specified soft tissue disorders: Secondary | ICD-10-CM | POA: Diagnosis not present

## 2018-05-03 DIAGNOSIS — M25472 Effusion, left ankle: Secondary | ICD-10-CM | POA: Diagnosis not present

## 2018-05-03 DIAGNOSIS — I83892 Varicose veins of left lower extremities with other complications: Secondary | ICD-10-CM | POA: Diagnosis not present

## 2018-05-03 DIAGNOSIS — S86012D Strain of left Achilles tendon, subsequent encounter: Secondary | ICD-10-CM | POA: Diagnosis not present

## 2018-05-23 DIAGNOSIS — Z23 Encounter for immunization: Secondary | ICD-10-CM | POA: Diagnosis not present

## 2018-06-06 DIAGNOSIS — N5201 Erectile dysfunction due to arterial insufficiency: Secondary | ICD-10-CM | POA: Diagnosis not present

## 2018-06-06 DIAGNOSIS — N393 Stress incontinence (female) (male): Secondary | ICD-10-CM | POA: Diagnosis not present

## 2018-06-06 DIAGNOSIS — C61 Malignant neoplasm of prostate: Secondary | ICD-10-CM | POA: Diagnosis not present

## 2018-06-06 DIAGNOSIS — C44319 Basal cell carcinoma of skin of other parts of face: Secondary | ICD-10-CM | POA: Diagnosis not present

## 2018-07-04 DIAGNOSIS — R51 Headache: Secondary | ICD-10-CM | POA: Diagnosis not present

## 2018-08-04 DIAGNOSIS — J209 Acute bronchitis, unspecified: Secondary | ICD-10-CM | POA: Diagnosis not present

## 2018-08-04 DIAGNOSIS — J029 Acute pharyngitis, unspecified: Secondary | ICD-10-CM | POA: Diagnosis not present

## 2018-08-04 DIAGNOSIS — J0100 Acute maxillary sinusitis, unspecified: Secondary | ICD-10-CM | POA: Diagnosis not present

## 2018-08-29 DIAGNOSIS — L820 Inflamed seborrheic keratosis: Secondary | ICD-10-CM | POA: Diagnosis not present

## 2018-08-29 DIAGNOSIS — D235 Other benign neoplasm of skin of trunk: Secondary | ICD-10-CM | POA: Diagnosis not present

## 2018-08-29 DIAGNOSIS — L821 Other seborrheic keratosis: Secondary | ICD-10-CM | POA: Diagnosis not present

## 2018-09-12 ENCOUNTER — Encounter: Payer: Self-pay | Admitting: Neurology

## 2018-09-12 ENCOUNTER — Ambulatory Visit: Payer: Medicare HMO | Admitting: Neurology

## 2018-09-12 ENCOUNTER — Encounter

## 2018-09-12 ENCOUNTER — Telehealth: Payer: Self-pay | Admitting: Neurology

## 2018-09-12 VITALS — BP 123/74 | HR 67 | Ht 76.5 in | Wt 238.3 lb

## 2018-09-12 DIAGNOSIS — Z5181 Encounter for therapeutic drug level monitoring: Secondary | ICD-10-CM

## 2018-09-12 DIAGNOSIS — G44059 Short lasting unilateral neuralgiform headache with conjunctival injection and tearing (SUNCT), not intractable: Secondary | ICD-10-CM | POA: Diagnosis not present

## 2018-09-12 HISTORY — DX: Short lasting unilateral neuralgiform headache with conjunctival injection and tearing (SUNCT), not intractable: G44.059

## 2018-09-12 NOTE — Progress Notes (Signed)
Reason for visit: Left facial pain  Referring physician: Dr. Narda Rutherford Sherley is a 78 y.o. male  History of present illness:  Mr. Anglemyer is a 78 year old right-handed white male with a history of headaches on the left side of his face that date back about 25 years.  The patient indicates that the episodes may come on and last for 2 to 3 days and then disappear for months only to recur.  The patient has not gone a full year without any pain.  The patient had a more prolonged event that began in November 2019, again associated with sharp jabs of pain that would go from the mid left face through the eye and into the top of the head on the left.  The episodes will be initiated by chewing.  The patient began having a new issue which involved sensitivity to light touch around the left forehead and around the left eye.  The pain was associated with a dull constant achy sensation.  Within the last 2 weeks, the pain is disappeared.  The patient reports no numbness or weakness of the face, arms, legs.  He has no visual changes or double vision or changes in speech or swallowing.  More recently in the last several days he did have one event of a round dark area in the left visual field that lasted about a half an hour and then disappeared.  This has never happened before.  The patient denies any significant balance problems or difficulty controlling the bowels or the bladder.  He does have some sinus drainage at times.  He claims that with that sharp jabs of pain he will have tearing of the left eye only.  He comes to this office for an evaluation.  Past Medical History:  Diagnosis Date  . Acid reflux   . Cancer [facility])    prostate  . Hypertension   . 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 Thyroid disease     Past Surgical History:  Procedure Laterality Date  . COLONOSCOPY WITH PROPOFOL N/A 04/13/2015   Procedure: COLONOSCOPY WITH PROPOFOL;  Surgeon:  Garlan Fair, MD;  Location: [facility] ENDOSCOPY;  Service: Endoscopy;  Laterality: N/A;  . PROSTATECTOMY  2001  . THYROID SURGERY      Family History  Problem Relation Age of Onset  . Cancer - Colon Father     Social history:  reports that he has never smoked. He has never used smokeless tobacco. He reports current alcohol use. He reports that he does not use drugs.  Medications:  Prior to Admission medications   Medication Sig Start Date End Date Taking? Authorizing Provider  aspirin EC 325 MG tablet Take 325 mg by mouth.   Yes [provider]  fluticasone Asencion Islam) 50 MCG/ACT nasal spray  03/14/16  Yes [provider]  levothyroxine (SYNTHROID, LEVOTHROID) 137 MCG tablet  03/21/16  Yes [provider]  losartan (COZAAR) 50 MG tablet Take 50 mg by mouth daily.   Yes [provider]  omeprazole (PRILOSEC) 40 MG capsule  07/17/14  Yes [provider]      Allergies  Allergen Reactions  . Iodine Rash  . Iodine-131 Rash  . Ivp Dye [Iodinated Diagnostic Agents] Itching and Rash    ROS:  Out of a complete 14 system review of symptoms, the patient complains only of the following symptoms, and all other reviewed systems are negative.  Headache  Blood pressure 123/74, pulse 67,  height 6' 4.5" (1.943 m), weight 238 lb 5 oz (108.1 kg).  Physical Exam  General: The patient is alert and cooperative at the time of the examination.  Eyes: Pupils are equal, round, and reactive to light. Discs are flat bilaterally.  Neck: The neck is supple, no carotid bruits are noted.  Respiratory: The respiratory examination is clear.  Cardiovascular: The cardiovascular examination reveals a regular rate and rhythm, no obvious murmurs or rubs are noted.  Neuromuscular: Range of movement the cervical spine lacks about 30 degrees of full lateral rotation bilaterally.  Some crepitus in the right temporomandibular joint is noted with jaw opening and  closure.  Skin: Extremities are without significant edema.  Neurologic Exam  Mental status: The patient is alert and oriented x 3 at the time of the examination. The patient has apparent normal recent and remote memory, with an apparently normal attention span and concentration ability.  Cranial nerves: Facial symmetry is present. There is good sensation of the face to pinprick and soft touch bilaterally. The strength of the facial muscles and the muscles to head turning and shoulder shrug are normal bilaterally. Speech is well enunciated, no aphasia or dysarthria is noted. Extraocular movements are full. Visual fields are full. The tongue is midline, and the patient has symmetric elevation of the soft palate. No obvious hearing deficits are noted.  Motor: The motor testing reveals 5 over 5 strength of all 4 extremities. Good symmetric motor tone is noted throughout.  Sensory: Sensory testing is intact to pinprick, soft touch, vibration sensation, and position sense on all 4 extremities. No evidence of extinction is noted.  Coordination: Cerebellar testing reveals good finger-nose-finger and heel-to-shin bilaterally.  Gait and station: Gait is normal. Tandem gait is slightly unsteady. Romberg is negative. No drift is seen.  Reflexes: Deep tendon reflexes are symmetric and normal bilaterally, with the exception that the ankle jerk reflexes are depressed bilaterally.  Toes are downgoing bilaterally.   Assessment/Plan:  1.  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  The patient gets tearing of the left eye with sharp jabs of pain consistent with the SUNCT headache syndrome.  The patient also has episodes of hypersensitivity to light touch on the left mid and upper face consistent with a SUNCT headache.  The episodes will come and go, the patient currently is not having any pain.  The pain will  likely recur at some point in the future and if prolonged, he may need to go on medications.  The patient may be treated with Topamax and Lamictal in the future for the headache.  The patient will follow-up if needed.  MRI of the brain will be done with and without gadolinium enhancement.  Blood work will be done today.  Jill Alexanders MD 09/12/2018 9:59 AM  [facility] [address] [place], [place] [address]  Phone [phone] Fax [phone]

## 2018-09-12 NOTE — Telephone Encounter (Signed)
[employer] medic[employer] order sent to [facility] for pt to be aware. I left [facility] phone number of [phone] and to give them a call if he has not heard from them in the next 2-3 business days.

## 2018-09-13 LAB — COMPREHENSIVE METABOLIC PANEL
A/G RATIO: 1.8 (ref 1.2–2.2)
ALK PHOS: 86 IU/L (ref 39–117)
ALT: 11 IU/L (ref 0–44)
AST: 16 IU/L (ref 0–40)
Albumin: 4.3 g/dL (ref 3.5–4.8)
BILIRUBIN TOTAL: 0.8 mg/dL (ref 0.0–1.2)
BUN/Creatinine Ratio: 11 (ref 10–24)
BUN: 12 mg/dL (ref 8–27)
CHLORIDE: 99 mmol/L (ref 96–106)
CO2: 21 mmol/L (ref 20–29)
Calcium: 9.6 mg/dL (ref 8.6–10.2)
Creatinine, Ser: 1.08 mg/dL (ref 0.76–1.27)
GFR calc Af Amer: 77 mL/min/{1.73_m2} (ref >59)
GFR calc non Af Amer: 66 mL/min/{1.73_m2} (ref >59)
GLUCOSE: 74 mg/dL (ref 65–99)
Globulin, Total: 2.4 g/dL (ref 1.5–4.5)
POTASSIUM: 4.2 mmol/L (ref 3.5–5.2)
Sodium: 137 mmol/L (ref 134–144)
Total Protein: 6.7 g/dL (ref 6.0–8.5)

## 2018-09-13 LAB — SEDIMENTATION RATE: Sed Rate: 20 mm/hr (ref 0–30)

## 2018-09-25 NOTE — Telephone Encounter (Signed)
[employer] medicare Josem Kaufmann: [id number] (exp. 09/24/18 to 12/23/18) patient is scheduled at [facility] for 09/26/18

## 2018-09-26 ENCOUNTER — Ambulatory Visit
Admission: RE | Admit: 2018-09-26 | Discharge: 2018-09-26 | Disposition: A | Discharge status: Home / Self Care | Payer: Medicare HMO | Source: Ambulatory Visit | Attending: Neurology | Admitting: Neurology

## 2018-09-26 DIAGNOSIS — R51 Headache: Secondary | ICD-10-CM | POA: Diagnosis not present

## 2018-09-26 DIAGNOSIS — G44059 Short lasting unilateral neuralgiform headache with conjunctival injection and tearing (SUNCT), not intractable: Secondary | ICD-10-CM

## 2018-09-26 MED ORDER — GADOBENATE DIMEGLUMINE 529 MG/ML IV SOLN
20.0000 mL | Freq: Once | INTRAVENOUS | Status: AC | PRN
  Administered 2018-09-26: 20 mL via INTRAVENOUS

## 2018-09-27 ENCOUNTER — Telehealth: Payer: Self-pay | Admitting: Neurology

## 2018-09-27 NOTE — Telephone Encounter (Signed)
  I called the patient.  MRI of the brain was normal with and without contrast.  The patient will contact our office if he has a return of his headaches.  MRI brain 09/26/18:  IMPRESSION: Unremarkable MRI scan of the brain with and without contrast.

## 2019-01-16 ENCOUNTER — Emergency Department (HOSPITAL_COMMUNITY): Payer: Medicare HMO

## 2019-01-16 ENCOUNTER — Observation Stay (HOSPITAL_COMMUNITY)
Admission: EM | Admit: 2019-01-16 | Discharge: 2019-01-17 | Disposition: A | Discharge status: Home / Self Care | Payer: Medicare HMO | Attending: Internal Medicine | Admitting: Internal Medicine

## 2019-01-16 ENCOUNTER — Encounter (HOSPITAL_COMMUNITY): Payer: Self-pay | Admitting: *Deleted

## 2019-01-16 ENCOUNTER — Other Ambulatory Visit: Payer: Self-pay

## 2019-01-16 ENCOUNTER — Observation Stay (HOSPITAL_COMMUNITY): Payer: Medicare HMO

## 2019-01-16 DIAGNOSIS — Z7982 Long term (current) use of aspirin: Secondary | ICD-10-CM | POA: Insufficient documentation

## 2019-01-16 DIAGNOSIS — I6502 Occlusion and stenosis of left vertebral artery: Secondary | ICD-10-CM | POA: Diagnosis not present

## 2019-01-16 DIAGNOSIS — Z79899 Other long term (current) drug therapy: Secondary | ICD-10-CM | POA: Diagnosis not present

## 2019-01-16 DIAGNOSIS — Z8546 Personal history of malignant neoplasm of prostate: Secondary | ICD-10-CM | POA: Insufficient documentation

## 2019-01-16 DIAGNOSIS — Z85828 Personal history of other malignant neoplasm of skin: Secondary | ICD-10-CM | POA: Diagnosis not present

## 2019-01-16 DIAGNOSIS — M6281 Muscle weakness (generalized): Secondary | ICD-10-CM | POA: Diagnosis not present

## 2019-01-16 DIAGNOSIS — I10 Essential (primary) hypertension: Secondary | ICD-10-CM | POA: Insufficient documentation

## 2019-01-16 DIAGNOSIS — Z20828 Contact with and (suspected) exposure to other viral communicable diseases: Secondary | ICD-10-CM | POA: Insufficient documentation

## 2019-01-16 DIAGNOSIS — R4701 Aphasia: Secondary | ICD-10-CM

## 2019-01-16 DIAGNOSIS — R2681 Unsteadiness on feet: Secondary | ICD-10-CM | POA: Diagnosis not present

## 2019-01-16 DIAGNOSIS — G459 Transient cerebral ischemic attack, unspecified: Principal | ICD-10-CM | POA: Diagnosis present

## 2019-01-16 DIAGNOSIS — R4789 Other speech disturbances: Secondary | ICD-10-CM | POA: Diagnosis present

## 2019-01-16 LAB — I-STAT CHEM 8, ED
BUN: 15 mg/dL (ref 8–23)
Calcium, Ion: 1.09 mmol/L — ABNORMAL LOW (ref 1.15–1.40)
Chloride: 100 mmol/L (ref 98–111)
Creatinine, Ser: 1 mg/dL (ref 0.61–1.24)
Glucose, Bld: 87 mg/dL (ref 70–99)
HCT: 51 % (ref 39.0–52.0)
Hemoglobin: 17.3 g/dL — ABNORMAL HIGH (ref 13.0–17.0)
Potassium: 4.4 mmol/L (ref 3.5–5.1)
Sodium: 134 mmol/L — ABNORMAL LOW (ref 135–145)
TCO2: 28 mmol/L (ref 22–32)

## 2019-01-16 LAB — COMPREHENSIVE METABOLIC PANEL
ALT: 17 U/L (ref 0–44)
AST: 24 U/L (ref 15–41)
Albumin: 4.1 g/dL (ref 3.5–5.0)
Alkaline Phosphatase: 87 U/L (ref 38–126)
Anion gap: 11 (ref 5–15)
BUN: 12 mg/dL (ref 8–23)
CO2: 26 mmol/L (ref 22–32)
Calcium: 9.4 mg/dL (ref 8.9–10.3)
Chloride: 101 mmol/L (ref 98–111)
Creatinine, Ser: 1.07 mg/dL (ref 0.61–1.24)
GFR calc Af Amer: >60 mL/min (ref >60)
GFR calc non Af Amer: >60 mL/min (ref >60)
Glucose, Bld: 92 mg/dL (ref 70–99)
Potassium: 4.4 mmol/L (ref 3.5–5.1)
Sodium: 138 mmol/L (ref 135–145)
Total Bilirubin: 1.3 mg/dL — ABNORMAL HIGH (ref 0.3–1.2)
Total Protein: 7 g/dL (ref 6.5–8.1)

## 2019-01-16 LAB — DIFFERENTIAL
Abs Immature Granulocytes: 0.02 10*3/uL (ref 0.00–0.07)
Basophils Absolute: 0.1 10*3/uL (ref 0.0–0.1)
Basophils Relative: 1 %
Eosinophils Absolute: 0.5 10*3/uL (ref 0.0–0.5)
Eosinophils Relative: 6 %
Immature Granulocytes: 0 %
Lymphocytes Relative: 17 %
Lymphs Abs: 1.5 10*3/uL (ref 0.7–4.0)
Monocytes Absolute: 0.6 10*3/uL (ref 0.1–1.0)
Monocytes Relative: 7 %
Neutro Abs: 6.1 10*3/uL (ref 1.7–7.7)
Neutrophils Relative %: 69 %

## 2019-01-16 LAB — URINALYSIS, ROUTINE W REFLEX MICROSCOPIC
Bilirubin Urine: NEGATIVE
Glucose, UA: NEGATIVE mg/dL
Hgb urine dipstick: NEGATIVE
Ketones, ur: 5 mg/dL — AB
Leukocytes,Ua: NEGATIVE
Nitrite: NEGATIVE
Protein, ur: NEGATIVE mg/dL
Specific Gravity, Urine: 1.035 — ABNORMAL HIGH (ref 1.005–1.030)
pH: 5 (ref 5.0–8.0)

## 2019-01-16 LAB — PROTIME-INR
INR: 1 (ref 0.8–1.2)
Prothrombin Time: 13.1 seconds (ref 11.4–15.2)

## 2019-01-16 LAB — CBC
HCT: 51.4 % (ref 39.0–52.0)
Hemoglobin: 16.3 g/dL (ref 13.0–17.0)
MCH: 28.6 pg (ref 26.0–34.0)
MCHC: 31.7 g/dL (ref 30.0–36.0)
MCV: 90.3 fL (ref 80.0–100.0)
Platelets: 208 10*3/uL (ref 150–400)
RBC: 5.69 MIL/uL (ref 4.22–5.81)
RDW: 13.2 % (ref 11.5–15.5)
WBC: 8.8 10*3/uL (ref 4.0–10.5)
nRBC: 0 % (ref 0.0–0.2)

## 2019-01-16 LAB — SARS CORONAVIRUS 2 BY RT PCR (HOSPITAL ORDER, PERFORMED IN ~~LOC~~ HOSPITAL LAB): SARS Coronavirus 2: NEGATIVE

## 2019-01-16 LAB — ETHANOL: Alcohol, Ethyl (B): <10 mg/dL (ref <10)

## 2019-01-16 LAB — APTT: aPTT: 27 seconds (ref 24–36)

## 2019-01-16 MED ORDER — ONDANSETRON HCL 4 MG/2ML IJ SOLN
INTRAMUSCULAR | Status: AC
  Filled 2019-01-16: qty 2

## 2019-01-16 MED ORDER — METHYLPREDNISOLONE SODIUM SUCC 125 MG IJ SOLR
INTRAMUSCULAR | Status: AC
  Filled 2019-01-16: qty 2

## 2019-01-16 MED ORDER — ACETAMINOPHEN 325 MG PO TABS: 650.0000 mg | ORAL_TABLET | ORAL | Status: DC | PRN

## 2019-01-16 MED ORDER — SENNOSIDES-DOCUSATE SODIUM 8.6-50 MG PO TABS: 1.0000 | ORAL_TABLET | Freq: Every evening | ORAL | Status: DC | PRN

## 2019-01-16 MED ORDER — CLOPIDOGREL BISULFATE 75 MG PO TABS: 75.0000 mg | ORAL_TABLET | Freq: Every day | ORAL | Status: DC

## 2019-01-16 MED ORDER — ENOXAPARIN SODIUM 40 MG/0.4ML ~~LOC~~ SOLN
40.0000 mg | SUBCUTANEOUS | Status: DC
  Administered 2019-01-16: 40 mg via SUBCUTANEOUS
  Filled 2019-01-16: qty 0.4

## 2019-01-16 MED ORDER — STROKE: EARLY STAGES OF RECOVERY BOOK
Freq: Once | Status: AC
  Administered 2019-01-16: 18:00:00
  Filled 2019-01-16: qty 1

## 2019-01-16 MED ORDER — ATORVASTATIN CALCIUM 80 MG PO TABS
80.0000 mg | ORAL_TABLET | Freq: Every day | ORAL | Status: DC
  Administered 2019-01-16: 80 mg via ORAL
  Filled 2019-01-16: qty 1

## 2019-01-16 MED ORDER — ACETAMINOPHEN 650 MG RE SUPP: 650.0000 mg | RECTAL | Status: DC | PRN

## 2019-01-16 MED ORDER — LEVOTHYROXINE SODIUM 25 MCG PO TABS
137.0000 ug | ORAL_TABLET | Freq: Every day | ORAL | Status: DC
  Administered 2019-01-17: 05:00:00 137 ug via ORAL
  Filled 2019-01-16: qty 1

## 2019-01-16 MED ORDER — DIPHENHYDRAMINE HCL 50 MG/ML IJ SOLN
25.0000 mg | Freq: Once | INTRAMUSCULAR | Status: AC
  Administered 2019-01-16: 14:00:00 25 mg via INTRAVENOUS

## 2019-01-16 MED ORDER — ONDANSETRON HCL 4 MG/2ML IJ SOLN
4.0000 mg | Freq: Once | INTRAMUSCULAR | Status: AC
  Administered 2019-01-16: 15:00:00 4 mg via INTRAVENOUS

## 2019-01-16 MED ORDER — ASPIRIN EC 81 MG PO TBEC: 81.0000 mg | DELAYED_RELEASE_TABLET | Freq: Every day | ORAL | Status: DC

## 2019-01-16 MED ORDER — METHYLPREDNISOLONE SODIUM SUCC 125 MG IJ SOLR
125.0000 mg | Freq: Once | INTRAMUSCULAR | Status: AC
  Administered 2019-01-16: 125 mg via INTRAVENOUS

## 2019-01-16 MED ORDER — PANTOPRAZOLE SODIUM 40 MG PO TBEC
40.0000 mg | DELAYED_RELEASE_TABLET | Freq: Every day | ORAL | Status: DC
  Administered 2019-01-17: 40 mg via ORAL
  Filled 2019-01-16: qty 1

## 2019-01-16 MED ORDER — DIPHENHYDRAMINE HCL 50 MG/ML IJ SOLN
INTRAMUSCULAR | Status: AC
  Filled 2019-01-16: qty 1

## 2019-01-16 MED ORDER — IOHEXOL 300 MG/ML  SOLN
100.0000 mL | Freq: Once | INTRAMUSCULAR | Status: AC | PRN
  Administered 2019-01-16: 15:00:00 100 mL via INTRAVENOUS

## 2019-01-16 MED ORDER — ACETAMINOPHEN 160 MG/5ML PO SOLN: 650.0000 mg | ORAL | Status: DC | PRN

## 2019-01-16 NOTE — ED Notes (Signed)
Pt to [facility] for evaluation for possible code stroke, pt reports difficulty interpreting things he was reading and trouble forming sentences and getting words out that started approx 1230. Pt reports the reading issues seem to have resolved but he is still having trouble forming words and sentences. Code stroke activated.

## 2019-01-16 NOTE — ED Notes (Signed)
Nurse navigator spoke with patient stated his wife is in the parking lot and does not want her contacted at this time.

## 2019-01-16 NOTE — H&P (Signed)
[facility] History and Physical  Ottavio Norem XTG:[id number] DOB: 07-Nov-1940 DOA: 01/16/2019 Referring physician: ED PCP: Seward Carol, MD  Chief Complaint: Word finding difficulty ------------------------------------------------------------------------------------------------------ Assessment/Plan: Active Problems:   TIA (transient ischemic attack)  TIA/early left hemispheric stroke -Presented with word finding difficulty.  No focal motor or sensory deficits. -CT scan of head and CT angios head and neck as above.  MRI brain ordered. -Neurology consult appreciated. -Stroke order set in place.   -PT/OT/ST eval, telemetry monitoring -N.p.o. until cleared by speech therapist -2D echocardiogram. -A1c, lipid panel, TSH -Prior to admission, patient was on aspirin 325 mg at home.  Neurology recommends to switch to Plavix 75 mg daily.  Also added Lipitor 80 mg daily.  Hypertension -Monitor blood pressure.  Permissive hypertension for first 24 to 48 hours. -Takes losartan at home which we will keep on hold for now.  Hypothyroidism -Continue Synthroid.  Obtain TSH level.  History of allergic reaction to iodine -Prior to CTA today, patient was premedicated with Solu-Medrol and Benadryl. -On my examination post procedure, patient does not report any rash or other symptoms.  Mobility: PT/OT/ST evaluation. Diet: Cardiac diet DVT prophylaxis:  Lovenox Code Status:  Full code Disposition Plan:  Pending MRI report.  ----------------------------------------------------------------------------------------------------- History of Present Illness: Patient is a 78 year old male with history of hypertension, short unilateral neuralgiform headache with conjunctival tearing (SUNCT), hypothyroidism, history of prostate cancer lives at home with his wife nephrologist and is functional at baseline. She does have been on between 12:30 to 1 PM, patient felt a sudden difficulty interpreting what  he was reading.  He also noticed some word finding difficulty and could not say what he wanted to say.  By the time of presentation to the ED, he says his reading issues resolved but still felt having trouble forming words and sentences family.  No focal motor weakness or sensory symptoms. No history of TIA, stroke.  No similar symptoms in the past. In the ED, patient was afebrile, heart rate in 50s and 60s regular, blood pressure 148/73 breathing comfortably on room air. Labs showed sodium level normal at 138, creatinine may be 1.07, hemoglobin 16.3, platelet 208.  Imagings: CT scan of the brain showed left frontal hypodensity-artifact versus early changes of stroke. Questionable hyperdense left MCA. CTA head and neck with no emergent LVO. All left MCA branches appear open. MRI brain pending. ECG showed normal sinus rhythm at 60 bpm with nonspecific ST-T wave changes. QTC 436 ms.  Review of Systems:  All systems were reviewed and were negative unless otherwise mentioned in the HPI.  Past medical history: Past Medical History:  Diagnosis Date   Acid reflux    Cancer ([place])    prostate   Hypertension    SUNCT (short unilateral neuralgiform headache, conjunctival inj/tear) 09/12/2018   Thyroid disease     Past surgical history: Past Surgical History:  Procedure Laterality Date   COLONOSCOPY WITH PROPOFOL N/A 04/13/2015   Procedure: COLONOSCOPY WITH PROPOFOL;  Surgeon: Garlan Fair, MD;  Location: [facility];  Service: Endoscopy;  Laterality: N/A;   PROSTATECTOMY  2001   THYROID SURGERY      Social History:  reports that he has never smoked. He has never used smokeless tobacco. He reports current alcohol use. He reports that he does not use drugs.  Allergies:  Allergies  Allergen Reactions   Iodine Rash   Iodine-131 Rash   Ivp Dye [Iodinated Diagnostic Agents] Itching and Rash    Family history:  Family History  Problem Relation Age of Onset   Cancer -  Colon Father      Home Meds: Prior to Admission medications   Medication Sig Start Date End Date Taking? Authorizing Provider  aspirin EC 325 MG tablet Take 325 mg by mouth.    [provider]  fluticasone Asencion Islam) 50 MCG/ACT nasal spray  03/14/16   [provider]  levothyroxine (SYNTHROID, LEVOTHROID) 137 MCG tablet  03/21/16   [provider]  losartan (COZAAR) 50 MG tablet Take 50 mg by mouth daily.    [provider]  omeprazole (PRILOSEC) 40 MG capsule  07/17/14   [provider]    Physical Exam: Vitals:   01/16/19 1400 01/16/19 1420 01/16/19 1449  BP:  (!) 156/70 (!) 150/80  Pulse:  76 (!) 58  Resp:  18 15  Temp:  98.9 F (37.2 C)   TempSrc:  Oral   SpO2:  97% 99%  Weight: 106.7 kg     Wt Readings from Last 3 Encounters:  01/16/19 106.7 kg  09/12/18 108.1 kg  04/13/15 106.6 kg   Body mass index is 28.26 kg/m.  General exam: Appears calm and comfortable.  Skin: No rashes, lesions or ulcers. HEENT: Atraumatic, normocephalic, supple neck, no obvious bleeding Lungs: Clear to auscultation bilaterally CVS: Regular rate and rhythm, mild systolic ejection murmur GI/Abd soft, nontender, nondistended, bowel sound present CNS: Alert, awake and oriented x3, no focal motor or sensory deficit at this time Psychiatry: Mood appropriate Extremities: No pedal edema, no calf tenderness  Labs on Admission:   CBC: Recent Labs  Lab 01/16/19 1358 01/16/19 1400  WBC 8.8  --   NEUTROABS 6.1  --   HGB 16.3 17.3*  HCT 51.4 51.0  MCV 90.3  --   PLT 208  --     Basic Metabolic Panel: Recent Labs  Lab 01/16/19 1358 01/16/19 1400  NA 138 134*  K 4.4 4.4  CL 101 100  CO2 26  --   GLUCOSE 92 87  BUN 12 15  CREATININE 1.07 1.00  CALCIUM 9.4  --     Liver Function Tests: Recent Labs  Lab 01/16/19 1358  AST 24  ALT 17  ALKPHOS 87  BILITOT 1.3*  PROT 7.0  ALBUMIN 4.1   No results for input(s): LIPASE, AMYLASE in the  last 168 hours. No results for input(s): AMMONIA in the last 168 hours.  Cardiac Enzymes: No results for input(s): CKTOTAL, CKMB, CKMBINDEX, TROPONINI in the last 168 hours.  BNP (last 3 results) No results for input(s): BNP in the last 8760 hours.  ProBNP (last 3 results) No results for input(s): PROBNP in the last 8760 hours.  CBG: No results for input(s): GLUCAP in the last 168 hours.  Lipase  No results found for: LIPASE   Urinalysis    Component Value Date/Time   COLORURINE YELLOW 05/05/2013 0900   APPEARANCEUR CLEAR 05/05/2013 0900   LABSPEC 1.014 05/05/2013 0900   PHURINE 6.5 05/05/2013 0900   GLUCOSEU NEGATIVE 05/05/2013 0900   HGBUR NEGATIVE 05/05/2013 0900   BILIRUBINUR NEGATIVE 05/05/2013 0900   KETONESUR NEGATIVE 05/05/2013 0900   PROTEINUR NEGATIVE 05/05/2013 0900   UROBILINOGEN 0.2 05/05/2013 0900   NITRITE NEGATIVE 05/05/2013 0900   LEUKOCYTESUR NEGATIVE 05/05/2013 0900     Drugs of Abuse  No results found for: LABOPIA, COCAINSCRNUR, LABBENZ, AMPHETMU, THCU, LABBARB    Radiological Exams on Admission: Ct Angio Head W Or Wo Contrast  Result Date: 01/16/2019 CLINICAL DATA:  Aphasia/word  forming difficulty. Questionable left frontal infarct on CT. EXAM: CT ANGIOGRAPHY HEAD AND NECK TECHNIQUE: Multidetector CT imaging of the head and neck was performed using the standard protocol during bolus administration of intravenous contrast. Multiplanar CT image reconstructions and MIPs were obtained to evaluate the vascular anatomy. Carotid stenosis measurements (when applicable) are obtained utilizing NASCET criteria, using the distal internal carotid diameter as the denominator. CONTRAST:  115mL OMNIPAQUE IOHEXOL 300 MG/ML  SOLN COMPARISON:  None. FINDINGS: CTA NECK FINDINGS Aortic arch: Standard 3 vessel aortic arch with mild atherosclerotic plaque. Widely patent arch vessel origins. Right carotid system: Patent without evidence of stenosis or dissection. Left carotid  system: Patent without evidence of stenosis or dissection. Mild, predominantly calcified plaque at the carotid bifurcation. Vertebral arteries: Patent and codominant without evidence of stenosis or dissection. Skeleton: Bridging anterior vertebral osteophytes throughout the lower cervical and upper thoracic spine suggesting DISH. Other neck: Thyroidectomy. No evidence of neck mass or lymphadenopathy. Upper chest: Clear lung apices. Review of the MIP images confirms the above findings CTA HEAD FINDINGS Anterior circulation: The intracranial vertebral arteries are widely patent from skull base to carotid termini with mild nonstenotic cavernous segment plaque primarily on the left. MCAs are patent without evidence of proximal branch occlusion or significant M1 stenosis. ACAs are patent without left A1 stenosis and with the right A1 segment being hypoplastic. No aneurysm is identified. Posterior circulation: The vertebral arteries are patent with mild stenosis near the left V3-V4 junction. Patent PICA and SCA origins are identified bilaterally. The basilar artery is widely patent. Posterior communicating arteries are not identified and may be diminutive or absent. PCAs are patent without evidence of significant proximal stenosis. No aneurysm is identified. Venous sinuses: Not adequately evaluated due to arterial contrast timing. Anatomic variants: Hypoplastic right A1. Review of the MIP images confirms the above findings IMPRESSION: 1. No large vessel occlusion. 2. Mild intracranial atherosclerosis without significant proximal anterior circulation stenosis. 3. Mild left vertebral artery stenosis near the V3-V4 junction. 4. Widely patent cervical carotid and vertebral arteries. 5.  Aortic Atherosclerosis (ICD10-I70.0). These results were called by telephone at the time of interpretation on 01/16/2019 at 2:36 pm to Dr. Amie Portland , who verbally acknowledged these results. Electronically Signed   By: Logan Bores M.D.    On: 01/16/2019 14:49   Ct Angio Neck W Or Wo Contrast  Result Date: 01/16/2019 CLINICAL DATA:  Aphasia/word forming difficulty. Questionable left frontal infarct on CT. EXAM: CT ANGIOGRAPHY HEAD AND NECK TECHNIQUE: Multidetector CT imaging of the head and neck was performed using the standard protocol during bolus administration of intravenous contrast. Multiplanar CT image reconstructions and MIPs were obtained to evaluate the vascular anatomy. Carotid stenosis measurements (when applicable) are obtained utilizing NASCET criteria, using the distal internal carotid diameter as the denominator. CONTRAST:  160mL OMNIPAQUE IOHEXOL 300 MG/ML  SOLN COMPARISON:  None. FINDINGS: CTA NECK FINDINGS Aortic arch: Standard 3 vessel aortic arch with mild atherosclerotic plaque. Widely patent arch vessel origins. Right carotid system: Patent without evidence of stenosis or dissection. Left carotid system: Patent without evidence of stenosis or dissection. Mild, predominantly calcified plaque at the carotid bifurcation. Vertebral arteries: Patent and codominant without evidence of stenosis or dissection. Skeleton: Bridging anterior vertebral osteophytes throughout the lower cervical and upper thoracic spine suggesting DISH. Other neck: Thyroidectomy. No evidence of neck mass or lymphadenopathy. Upper chest: Clear lung apices. Review of the MIP images confirms the above findings CTA HEAD FINDINGS Anterior circulation: The intracranial vertebral arteries are  widely patent from skull base to carotid termini with mild nonstenotic cavernous segment plaque primarily on the left. MCAs are patent without evidence of proximal branch occlusion or significant M1 stenosis. ACAs are patent without left A1 stenosis and with the right A1 segment being hypoplastic. No aneurysm is identified. Posterior circulation: The vertebral arteries are patent with mild stenosis near the left V3-V4 junction. Patent PICA and SCA origins are identified  bilaterally. The basilar artery is widely patent. Posterior communicating arteries are not identified and may be diminutive or absent. PCAs are patent without evidence of significant proximal stenosis. No aneurysm is identified. Venous sinuses: Not adequately evaluated due to arterial contrast timing. Anatomic variants: Hypoplastic right A1. Review of the MIP images confirms the above findings IMPRESSION: 1. No large vessel occlusion. 2. Mild intracranial atherosclerosis without significant proximal anterior circulation stenosis. 3. Mild left vertebral artery stenosis near the V3-V4 junction. 4. Widely patent cervical carotid and vertebral arteries. 5.  Aortic Atherosclerosis (ICD10-I70.0). These results were called by telephone at the time of interpretation on 01/16/2019 at 2:36 pm to Dr. Amie Portland , who verbally acknowledged these results. Electronically Signed   By: Logan Bores M.D.   On: 01/16/2019 14:49   Ct Head Code Stroke Wo Contrast  Result Date: 01/16/2019 CLINICAL DATA:  Code stroke.  Aphasia. EXAM: CT HEAD WITHOUT CONTRAST TECHNIQUE: Contiguous axial images were obtained from the base of the skull through the vertex without intravenous contrast. COMPARISON:  Brain MRI 09/26/2018 FINDINGS: Brain: There is hypoattenuation anteriorly in the left frontal lobe in an area that is often prone to artifact, however the hypoattenuation is significantly asymmetric and cannot be definitively attributed to artifact on reformats. There is no evidence of acute infarct elsewhere. No intracranial hemorrhage, mass, midline shift, or extra-axial fluid collection is identified. Mild cerebral atrophy is within normal limits for age. A cavum septum pellucidum is incidentally noted. Vascular: Calcified atherosclerosis at the skull base. Mildly increased intracranial vascular density diffusely without suspicious focal hyperdensity. Skull: No fracture or focal osseous lesion. Sinuses/Orbits: Mild mucosal thickening in the  paranasal sinuses, greatest in the ethmoid air cells bilaterally. Clear mastoid air cells. Bilateral cataract extraction. Other: None. ASPECTS ([place] Stroke Program Early CT Score) - Ganglionic level infarction (caudate, lentiform nuclei, internal capsule, insula, M1-M3 cortex): 6 considering the possibility of a left frontal infarct - Supraganglionic infarction (M4-M6 cortex): 3 Total score (0-10 with 10 being normal): 9 IMPRESSION: 1. Infarct versus artifact in the anterior left frontal lobe. No intracranial hemorrhage. 2. ASPECTS is 9. These results were communicated to Dr. Rory Percy at 2:18 pm on 01/16/2019 by text page via the [facility] messaging system. Electronically Signed   By: Logan Bores M.D.   On: 01/16/2019 14:19   ----------------------------------------------------------------------------------------------------------------------------------------------------------- Severity of Illness: The appropriate patient status for this patient is OBSERVATION. Observation status is judged to be reasonable and necessary in order to provide the required intensity of service to ensure the patient's safety. The patient's presenting symptoms, physical exam findings, and initial radiographic and laboratory data in the context of their medical condition is felt to place them at decreased risk for further clinical deterioration. Furthermore, it is anticipated that the patient will be medically stable for discharge from the hospital within 2 midnights of admission. The following factors support the patient status of observation.   " The patient's presenting symptoms include word finding difficulties. " The physical exam findings include normal neurological findings. " The initial CT scan head showed probable early changes of  stroke.Cline Cools, MD [facility] 01/16/2019

## 2019-01-16 NOTE — Plan of Care (Signed)
  Problem: Education: Goal: Knowledge of General Education information will improve Description Including pain rating scale, medication(s)/side effects and non-pharmacologic comfort measures Outcome: Progressing   Problem: Health Behavior/Discharge Planning: Goal: Ability to manage health-related needs will improve Outcome: Progressing   Problem: Clinical Measurements: Goal: Ability to maintain clinical measurements within normal limits will improve Outcome: Progressing Goal: Will remain free from infection Outcome: Progressing Goal: Diagnostic test results will improve Outcome: Progressing Goal: Respiratory complications will improve Outcome: Progressing Goal: Cardiovascular complication will be avoided Outcome: Progressing   Problem: Activity: Goal: Risk for activity intolerance will decrease Outcome: Progressing   Problem: Nutrition: Goal: Adequate nutrition will be maintained Outcome: Progressing   Problem: Pain Managment: Goal: General experience of comfort will improve Outcome: Progressing   Problem: Safety: Goal: Ability to remain free from injury will improve Outcome: Progressing   Problem: Skin Integrity: Goal: Risk for impaired skin integrity will decrease Outcome: Progressing   Problem: Education: Goal: Knowledge of disease or condition will improve Outcome: Progressing Goal: Knowledge of secondary prevention will improve Outcome: Progressing Goal: Knowledge of patient specific risk factors addressed and post discharge goals established will improve Outcome: Progressing Goal: Individualized Educational Video(s) Outcome: Progressing   Problem: Ischemic Stroke/TIA Tissue Perfusion: Goal: Complications of ischemic stroke/TIA will be minimized Outcome: Progressing    Ival Bible, BSN, RN

## 2019-01-16 NOTE — ED Notes (Addendum)
PER DR. Volanda Napoleon NIH UNTIL 1730 TODAY

## 2019-01-16 NOTE — Consult Note (Signed)
Neurology Consultation  Reason for Consult: Word finding difficulties Referring Physician: Dr. Reather Converse  CC: Word finding difficulty  History is obtained from: Patient, Chart  HPI: Arkin Imran is a 78 y.o. 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 He was in his usual state of health and last normal around 12:30 PM today when he had sudden difficulty with interpreting what he was reading at around 1 PM.  He also then started noticing some word finding difficulty and could not say what he wanted to say.  He says his reading issues have resolved but he still feels he is having trouble forming words and sentences normally. He denies any similar symptoms in the past but not denies any current headache.  Has a history of SUNCT, but has no headache today. Denies any fevers chills.  Denies shortness of breath, cough.  Denies chest pain.  Denies vomiting but did report some nausea when he was seen in the CT scanner.   LKW: 1 PM PM on 01/16/2019 tpa given?: no, NIH 0 Premorbid modified Rankin scale (mRS):   ROS: ROS was performed and is negative except as noted in the HPI.   Past Medical History:  Diagnosis Date  . Acid reflux   . Cancer [facility])    prostate  . Hypertension   . 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 Thyroid disease     Family History  Problem Relation Age of Onset  . Cancer - Colon Father     Social History:   reports that he has never smoked. He has never used smokeless tobacco. He reports current alcohol use. He reports that he does not use drugs.  Medications  Current Facility-Administered Medications:  .  diphenhydrAMINE (BENADRYL) injection 25 mg, 25 mg, Intravenous, Once, Amie Portland, MD .  methylPREDNISolone sodium succinate  (SOLU-MEDROL) 125 mg/2 mL injection 125 mg, 125 mg, Intravenous, Once, Amie Portland, MD .  ondansetron [facility]) 4 MG/2ML injection, , , ,  .  ondansetron (ZOFRAN) injection 4 mg, 4 mg, Intravenous, Once, Amie Portland, MD .  triamcinolone acetonide (KENALOG) 10 MG/ML injection 10 mg, 10 mg, Other, Once, Landis Martins, DPM  Current Outpatient Medications:  .  aspirin EC 325 MG tablet, Take 325 mg by mouth., Disp: , Rfl:  .  fluticasone (FLONASE) 50 MCG/ACT nasal spray, , Disp: , Rfl:  .  levothyroxine (SYNTHROID, LEVOTHROID) 137 MCG tablet, , Disp: , Rfl:  .  losartan (COZAAR) 50 MG tablet, Take 50 mg by mouth daily., Disp: , Rfl:  .  omeprazole (PRILOSEC) 40 MG capsule, , Disp: , Rfl:   Exam: Current vital signs: Wt 106.7 kg   BMI 28.26 kg/m  Vital signs in last 24 hours: Weight:  [106.7 kg] 106.7 kg (05/21 1400) GENERAL: Awake, alert in NAD HEENT: - Normocephalic and atraumatic, dry mm, no LN++, no Thyromegally LUNGS - Clear to auscultation bilaterally with no wheezes CV - S1S2 RRR, no m/r/g, equal pulses bilaterally. ABDOMEN - Soft, nontender, nondistended with normoactive BS Ext: warm, well perfused, intact peripheral pulses, no edema NEURO:  Mental Status: AA&Ox3  Language: speech is non-dysarthric.  Naming, repetition, fluency, and comprehension intact.  Was able to describe the NIH stroke scale cards perfectly, read sentences perfectly and interpret the picture without problems. Cranial Nerves: PERRL. EOMI, visual fields full, no facial asymmetry, facial sensation intact, hearing  intact, tongue/uvula/soft palate midline, normal sternocleidomastoid and trapezius muscle strength. No evidence of tongue atrophy or fibrillations Motor: 5/5 without vertical drift in all fours Tone: is normal and bulk is normal Sensation- Intact to light touch bilaterally Coordination: FTN intact bilaterally, no ataxia in BLE. Gait- deferred NIHSS - 0   Labs I have reviewed labs in epic and  the results pertinent to this consultation are: CBC    Component Value Date/Time   WBC 8.8 01/16/2019 1358   RBC 5.69 01/16/2019 1358   HGB 17.3 (H) 01/16/2019 1400   HCT 51.0 01/16/2019 1400   PLT 208 01/16/2019 1358   MCV 90.3 01/16/2019 1358   MCH 28.6 01/16/2019 1358   MCHC 31.7 01/16/2019 1358   RDW 13.2 01/16/2019 1358   LYMPHSABS 1.5 01/16/2019 1358   MONOABS 0.6 01/16/2019 1358   EOSABS 0.5 01/16/2019 1358   BASOSABS 0.1 01/16/2019 1358   CMP     Component Value Date/Time   NA 134 (L) 01/16/2019 1400   NA 137 09/12/2018 1014   K 4.4 01/16/2019 1400   CL 100 01/16/2019 1400   CO2 21 09/12/2018 1014   GLUCOSE 87 01/16/2019 1400   BUN 15 01/16/2019 1400   BUN 12 09/12/2018 1014   CREATININE 1.00 01/16/2019 1400   CALCIUM 9.6 09/12/2018 1014   PROT 6.7 09/12/2018 1014   ALBUMIN 4.3 09/12/2018 1014   AST 16 09/12/2018 1014   ALT 11 09/12/2018 1014   ALKPHOS 86 09/12/2018 1014   BILITOT 0.8 09/12/2018 1014   GFRNONAA 66 09/12/2018 1014   GFRAA 77 09/12/2018 1014   Imaging I have reviewed the images obtained: CT-scan of the brain-left frontal hypodensity-artifact versus early changes of stroke.  Questionable hyperdense left MCA. CTA head and neck with no emergent LVO.  All left MCA branches appear open.  Assessment: 78 year old man past history of hypertension, SUNCT, prostate cancer, with sudden onset of difficulty with word finding and difficulty reading and interpreting what he was reading at the time. The leading difficulty has resolved but he still feels he has word finding difficulty and unable to form sentences normally. Noncontrast head CT was concerning for a possible left frontal hypodensity-concerning for either an artifact versus early changes of a stroke.  This was followed by a CTA head and neck to evaluate the vessels which were all patent. Given the word finding and comprehension difficulties, symptoms could be concerning for a left cerebral  hemispheric stroke/TIA. He is still within the window to receive IV TPA till 5 PM and I would recommend observing him at least till then with every hour neuro checks and then according to stroke protocol. I think he will benefit from an inpatient work-up  Impression: Evaluate for left hemispheric stroke/TIA  Recommendations: Admit to hiospitalist for stroke/TIA w/u MRI brain w/o contrast 2D echo A1c, Lipid panel Keep in 'code stroke window' until 5:30 PM- every 1 hour neurochecks Telemetry PT OT ST N.p.o. until cleared by speech/swallow Prophylactic therapy:: Antiplatelet-on aspirin 325 at home.  Consider switching to Plavix 75 daily. Add atorvastatin 80 mg daily Patient had some allergic rash to iodine 20 years ago-for this reason, Solu-Medrol and Benadryl was given prior to giving him iodinated dye for the CTA.  He did not report any rash after the CTA.  I wanted make sure that the primary team is aware of this.  Stroke team will follow. -- Amie Portland, MD [facility] Pager: [phone] If 7pm to 7am, please call on call as  listed on AMION.

## 2019-01-16 NOTE — Progress Notes (Signed)
Pt off unit for MRI at 2200

## 2019-01-16 NOTE — Code Documentation (Signed)
78yo male arriving to [facility] via private vehicle at 1348. Patient from home where he noticed sudden onset difficulty reading with word finding difficulty at 1300. Patient's wife drove him to the hospital where a code stroke was activated. Stroke team met patient in CT. CT followed by CTA head and neck completed. Patient given Benadryl 25mg IVP and Solumedrol 125mg IVP prior to CTA per MD order for h/o contrast allergy. Patient also c/o nausea in CT and given Zofran 4mg IVP. NIHSS 0, see documentation for details and code stroke times. Patient able to read, name and repeat appropriately. Patient continues to feel as if he is having difficulty with his words but reports improvement. No acute stroke treatment at this time due to no focal deficits on exam. TIA alert. Patient to be monitored frequently for return of symptoms. Bedside handoff with ED RN Alexa. 

## 2019-01-16 NOTE — ED Triage Notes (Signed)
Pt states that at approximately around 1300 today , he was reading some type of nutritional  value label , when he began to have difficulty " getting his words out " pt also states that he had some dizziness as well; pt reports his speech is getting better at this time ; pt alert and oriented x 4 and following simple commands

## 2019-01-16 NOTE — ED Provider Notes (Signed)
[facility] EMERGENCY DEPARTMENT Provider Note   CSN: [id number] Arrival date & time: 01/16/19  1348    History   Chief Complaint Chief Complaint  Patient presents with   Code Stroke    HPI Chesley Veasey is a 78 y.o. male.     Patient with history of prostate cancer, high blood pressure, basal cell carcinoma of nose presents with word finding difficulties and difficulty with reading.  This started approximately 1230 this afternoon.  No history of similar.  No known stroke history.  Patient takes baby aspirin but no other blood thinners.  No significant headache.  No unilateral weakness.     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 Authorizing Provider  aspirin EC 325 MG tablet Take 325 mg by mouth.    [provider]  fluticasone Asencion Islam) 50 MCG/ACT nasal spray  03/14/16   [provider]  levothyroxine (SYNTHROID, LEVOTHROID) 137 MCG tablet  03/21/16   [provider]  losartan (COZAAR) 50 MG tablet Take 50 mg by mouth daily.    [provider]  omeprazole (PRILOSEC) 40 MG capsule  07/17/14   [provider]    Family History Family History  Problem Relation Age of Onset   Cancer -  Colon Father     Social History Social History   Tobacco Use   Smoking status: Never Smoker   Smokeless tobacco: Never Used  Substance Use Topics   Alcohol use: Yes   Drug use: No     Allergies   Iodine; Iodine-131; and Ivp dye [iodinated diagnostic agents]   Review of Systems Review of Systems  Constitutional: Negative for chills and fever.  HENT: Negative for congestion.   Eyes: Negative for visual disturbance.  Respiratory: Negative for shortness of breath.   Cardiovascular: Negative for chest pain.  Gastrointestinal: Negative for abdominal pain and vomiting.  Genitourinary: Negative for dysuria and flank pain.  Musculoskeletal: Negative for back pain, neck pain and neck stiffness.  Skin: Negative for rash.  Neurological: Positive for speech difficulty. Negative for light-headedness and headaches.     Physical Exam Updated Vital Signs BP (!) 150/80    Pulse (!) 58    Temp 98.9 F (37.2 C) (Oral)    Resp 15    Wt 106.7 kg    SpO2 99%    BMI 28.26 kg/m   Physical Exam Vitals signs and nursing note reviewed.  Constitutional:      Appearance: He is well-developed.  HENT:     Head: Normocephalic and atraumatic.  Eyes:     General:        Right eye: No discharge.        Left eye: No  discharge.     Conjunctiva/sclera: Conjunctivae normal.  Neck:     Musculoskeletal: Normal range of motion and neck supple.     Trachea: No tracheal deviation.  Cardiovascular:     Rate and Rhythm: Normal rate and regular rhythm.  Pulmonary:     Effort: Pulmonary effort is normal.     Breath sounds: Normal breath sounds.  Abdominal:     General: There is no distension.     Palpations: Abdomen is soft.     Tenderness: There is no abdominal tenderness. There is no guarding.  Skin:    General: Skin is warm.     Findings: No rash.  Neurological:     Mental Status: He is alert and oriented to person, place, and time.     GCS: GCS eye subscore is 4. GCS verbal subscore is 5.  GCS motor subscore is 6.     Sensory: Sensation is intact.     Motor: Motor function is intact.     Coordination: Coordination is intact.     Comments: Visual fields intact eomfi  Mild express's weight Minimal expressive aphasia      ED Treatments / Results  Labs (all labs ordered are listed, but only abnormal results are displayed) Labs Reviewed  COMPREHENSIVE METABOLIC PANEL - Abnormal; Notable for the following components:      Result Value   Total Bilirubin 1.3 (*)    All other components within normal limits  I-STAT CHEM 8, ED - Abnormal; Notable for the following components:   Sodium 134 (*)    Calcium, Ion 1.09 (*)    Hemoglobin 17.3 (*)    All other components within normal limits  ETHANOL  PROTIME-INR  APTT  CBC  DIFFERENTIAL  RAPID URINE DRUG SCREEN, HOSP PERFORMED  URINALYSIS, ROUTINE W REFLEX MICROSCOPIC    EKG EKG Interpretation  Date/Time:  Thursday Jan 16 2019 14:45:52 EDT Ventricular Rate:  68 PR Interval:    QRS Duration: 112 QT Interval:  410 QTC Calculation: 436 R Axis:   92 Text Interpretation:  Sinus rhythm Borderline intraventricular conduction delay ST elevation, consider inferior injury When compared to prior, no significant changes seen.  No STEMI Confirmed by Antony Blackbird [phone]) on 01/16/2019 3:23:20 PM   Radiology Ct Angio Head W Or Wo Contrast  Result Date: 01/16/2019 CLINICAL DATA:  Aphasia/word forming difficulty. Questionable left frontal infarct on CT. EXAM: CT ANGIOGRAPHY HEAD AND NECK TECHNIQUE: Multidetector CT imaging of the head and neck was performed using the standard protocol during bolus administration of intravenous contrast. Multiplanar CT image reconstructions and MIPs were obtained to evaluate the vascular anatomy. Carotid stenosis measurements (when applicable) are obtained utilizing NASCET criteria, using the distal internal carotid diameter as the denominator. CONTRAST:  139mL OMNIPAQUE IOHEXOL 300 MG/ML  SOLN  COMPARISON:  None. FINDINGS: CTA NECK FINDINGS Aortic arch: Standard 3 vessel aortic arch with mild atherosclerotic plaque. Widely patent arch vessel origins. Right carotid system: Patent without evidence of stenosis or dissection. Left carotid system: Patent without evidence of stenosis or dissection. Mild, predominantly calcified plaque at the carotid bifurcation. Vertebral arteries: Patent and codominant without evidence of stenosis or dissection. Skeleton: Bridging anterior vertebral osteophytes throughout the lower cervical and upper thoracic spine suggesting DISH. Other neck: Thyroidectomy. No evidence of neck mass or lymphadenopathy. Upper chest: Clear lung apices. Review of the MIP images confirms the above findings CTA HEAD FINDINGS Anterior circulation: The intracranial vertebral arteries are widely patent from skull base to carotid termini with  mild nonstenotic cavernous segment plaque primarily on the left. MCAs are patent without evidence of proximal branch occlusion or significant M1 stenosis. ACAs are patent without left A1 stenosis and with the right A1 segment being hypoplastic. No aneurysm is identified. Posterior circulation: The vertebral arteries are patent with mild stenosis near the left V3-V4 junction. Patent PICA and SCA origins are identified bilaterally. The basilar artery is widely patent. Posterior communicating arteries are not identified and may be diminutive or absent. PCAs are patent without evidence of significant proximal stenosis. No aneurysm is identified. Venous sinuses: Not adequately evaluated due to arterial contrast timing. Anatomic variants: Hypoplastic right A1. Review of the MIP images confirms the above findings IMPRESSION: 1. No large vessel occlusion. 2. Mild intracranial atherosclerosis without significant proximal anterior circulation stenosis. 3. Mild left vertebral artery stenosis near the V3-V4 junction. 4. Widely patent cervical carotid and vertebral arteries. 5.   Aortic Atherosclerosis (ICD10-I70.0). These results were called by telephone at the time of interpretation on 01/16/2019 at 2:36 pm to Dr. Amie Portland , who verbally acknowledged these results. Electronically Signed   By: Logan Bores M.D.   On: 01/16/2019 14:49   Ct Angio Neck W Or Wo Contrast  Result Date: 01/16/2019 CLINICAL DATA:  Aphasia/word forming difficulty. Questionable left frontal infarct on CT. EXAM: CT ANGIOGRAPHY HEAD AND NECK TECHNIQUE: Multidetector CT imaging of the head and neck was performed using the standard protocol during bolus administration of intravenous contrast. Multiplanar CT image reconstructions and MIPs were obtained to evaluate the vascular anatomy. Carotid stenosis measurements (when applicable) are obtained utilizing NASCET criteria, using the distal internal carotid diameter as the denominator. CONTRAST:  152mL OMNIPAQUE IOHEXOL 300 MG/ML  SOLN COMPARISON:  None. FINDINGS: CTA NECK FINDINGS Aortic arch: Standard 3 vessel aortic arch with mild atherosclerotic plaque. Widely patent arch vessel origins. Right carotid system: Patent without evidence of stenosis or dissection. Left carotid system: Patent without evidence of stenosis or dissection. Mild, predominantly calcified plaque at the carotid bifurcation. Vertebral arteries: Patent and codominant without evidence of stenosis or dissection. Skeleton: Bridging anterior vertebral osteophytes throughout the lower cervical and upper thoracic spine suggesting DISH. Other neck: Thyroidectomy. No evidence of neck mass or lymphadenopathy. Upper chest: Clear lung apices. Review of the MIP images confirms the above findings CTA HEAD FINDINGS Anterior circulation: The intracranial vertebral arteries are widely patent from skull base to carotid termini with mild nonstenotic cavernous segment plaque primarily on the left. MCAs are patent without evidence of proximal branch occlusion or significant M1 stenosis. ACAs are patent without  left A1 stenosis and with the right A1 segment being hypoplastic. No aneurysm is identified. Posterior circulation: The vertebral arteries are patent with mild stenosis near the left V3-V4 junction. Patent PICA and SCA origins are identified bilaterally. The basilar artery is widely patent. Posterior communicating arteries are not identified and may be diminutive or absent. PCAs are patent without evidence of significant proximal stenosis. No aneurysm is identified. Venous sinuses: Not adequately evaluated due to arterial contrast timing. Anatomic variants: Hypoplastic right A1. Review of the MIP images confirms the above findings IMPRESSION: 1. No large vessel occlusion. 2. Mild intracranial atherosclerosis without significant proximal anterior circulation stenosis. 3. Mild left vertebral artery stenosis near the V3-V4 junction. 4. Widely patent cervical carotid and vertebral arteries. 5.  Aortic Atherosclerosis (ICD10-I70.0). These results were called by telephone at the time of interpretation on 01/16/2019 at 2:36 pm to Dr. Amie Portland , who verbally acknowledged these results. Electronically Signed  By: Logan Bores M.D.   On: 01/16/2019 14:49   Ct Head Code Stroke Wo Contrast  Result Date: 01/16/2019 CLINICAL DATA:  Code stroke.  Aphasia. EXAM: CT HEAD WITHOUT CONTRAST TECHNIQUE: Contiguous axial images were obtained from the base of the skull through the vertex without intravenous contrast. COMPARISON:  Brain MRI 09/26/2018 FINDINGS: Brain: There is hypoattenuation anteriorly in the left frontal lobe in an area that is often prone to artifact, however the hypoattenuation is significantly asymmetric and cannot be definitively attributed to artifact on reformats. There is no evidence of acute infarct elsewhere. No intracranial hemorrhage, mass, midline shift, or extra-axial fluid collection is identified. Mild cerebral atrophy is within normal limits for age. A cavum septum pellucidum is incidentally noted.  Vascular: Calcified atherosclerosis at the skull base. Mildly increased intracranial vascular density diffusely without suspicious focal hyperdensity. Skull: No fracture or focal osseous lesion. Sinuses/Orbits: Mild mucosal thickening in the paranasal sinuses, greatest in the ethmoid air cells bilaterally. Clear mastoid air cells. Bilateral cataract extraction. Other: None. ASPECTS ([place] Stroke Program Early CT Score) - Ganglionic level infarction (caudate, lentiform nuclei, internal capsule, insula, M1-M3 cortex): 6 considering the possibility of a left frontal infarct - Supraganglionic infarction (M4-M6 cortex): 3 Total score (0-10 with 10 being normal): 9 IMPRESSION: 1. Infarct versus artifact in the anterior left frontal lobe. No intracranial hemorrhage. 2. ASPECTS is 9. These results were communicated to Dr. Rory Percy at 2:18 pm on 01/16/2019 by text page via the [facility] messaging system. Electronically Signed   By: Logan Bores M.D.   On: 01/16/2019 14:19    Procedures .Critical Care Performed by: Elnora Morrison, MD Authorized by: Elnora Morrison, MD   Critical care provider statement:    Critical care time (minutes):  35   Critical care start time:  01/16/2019 2:10 PM   Critical care end time:  01/16/2019 2:45 PM   Critical care time was exclusive of:  Separately billable procedures and treating other patients and teaching time   Critical care was necessary to treat or prevent imminent or life-threatening deterioration of the following conditions:  CNS failure or compromise   Critical care was time spent personally by me on the following activities:  Discussions with consultants, evaluation of patient's response to treatment, examination of patient, ordering and performing treatments and interventions, ordering and review of laboratory studies, ordering and review of radiographic studies, pulse oximetry, re-evaluation of patient's condition, obtaining history from patient or surrogate and review of old  charts   (including critical care time)  Medications Ordered in ED Medications  ondansetron (ZOFRAN) 4 MG/2ML injection (has no administration in time range)  diphenhydrAMINE (BENADRYL) 50 MG/ML injection (has no administration in time range)  methylPREDNISolone sodium succinate (SOLU-MEDROL) 125 mg/2 mL injection (has no administration in time range)  ondansetron (ZOFRAN) injection 4 mg (4 mg Intravenous Given 01/16/19 1446)  methylPREDNISolone sodium succinate (SOLU-MEDROL) 125 mg/2 mL injection 125 mg (125 mg Intravenous Given 01/16/19 1419)  diphenhydrAMINE (BENADRYL) injection 25 mg (25 mg Intravenous Given 01/16/19 1418)  iohexol (OMNIPAQUE) 300 MG/ML solution 100 mL (100 mLs Intravenous Contrast Given 01/16/19 1437)     Initial Impression / Assessment and Plan / ED Course  I have reviewed the triage vital signs and the nursing notes.  Pertinent labs & imaging results that were available during my care of the patient were reviewed by me and considered in my medical decision making (see chart for details).       Patient presents with acute onset  aphasia.  No other significant deficits at this time.  With patient being within time window code stroke called immediately.  Neurology evaluated at bedside.  Discussed with neurology on the phone CTA pending concern for possible stroke on CT scan. Plan for admission and very close observation. CTs reviewed no acute abnormalities. Discussed with neurology and hospitalist for admission. Patient is in the TPA window until 5:30 PM.  Blood work reviewed no acute findings.  Updated patient on plan.  Final Clinical Impressions(s) / ED Diagnoses   Final diagnoses:  Aphasia    ED Discharge Orders    None       Elnora Morrison, MD 01/16/19 1525

## 2019-01-17 ENCOUNTER — Observation Stay (HOSPITAL_BASED_OUTPATIENT_CLINIC_OR_DEPARTMENT_OTHER): Payer: Medicare HMO

## 2019-01-17 DIAGNOSIS — G459 Transient cerebral ischemic attack, unspecified: Secondary | ICD-10-CM

## 2019-01-17 DIAGNOSIS — G500 Trigeminal neuralgia: Secondary | ICD-10-CM

## 2019-01-17 DIAGNOSIS — R4701 Aphasia: Secondary | ICD-10-CM | POA: Diagnosis not present

## 2019-01-17 LAB — LIPID PANEL
Cholesterol: 195 mg/dL (ref 0–200)
HDL: 49 mg/dL (ref >40)
LDL Cholesterol: 130 mg/dL — ABNORMAL HIGH (ref 0–99)
Total CHOL/HDL Ratio: 4 RATIO
Triglycerides: 78 mg/dL (ref <150)
VLDL: 16 mg/dL (ref 0–40)

## 2019-01-17 LAB — RAPID URINE DRUG SCREEN, HOSP PERFORMED
Amphetamines: NOT DETECTED
Barbiturates: NOT DETECTED
Benzodiazepines: NOT DETECTED
Cocaine: NOT DETECTED
Opiates: NOT DETECTED
Tetrahydrocannabinol: NOT DETECTED

## 2019-01-17 LAB — ECHOCARDIOGRAM COMPLETE: Weight: 3763.69 oz

## 2019-01-17 LAB — HEMOGLOBIN A1C
Hgb A1c MFr Bld: 5.4 % (ref 4.8–5.6)
Mean Plasma Glucose: 108.28 mg/dL

## 2019-01-17 MED ORDER — ASPIRIN EC 325 MG PO TBEC
325.0000 mg | DELAYED_RELEASE_TABLET | Freq: Every day | ORAL | Status: DC
  Administered 2019-01-17: 325 mg via ORAL
  Filled 2019-01-17: qty 1

## 2019-01-17 MED ORDER — EZETIMIBE 10 MG PO TABS
10.0000 mg | ORAL_TABLET | Freq: Every day | ORAL | Status: DC
  Administered 2019-01-17: 10:00:00 10 mg via ORAL
  Filled 2019-01-17: qty 1

## 2019-01-17 MED ORDER — EZETIMIBE 10 MG PO TABS: 10.0000 mg | ORAL_TABLET | Freq: Every day | ORAL | 0 refills | Status: DC

## 2019-01-17 MED ORDER — ASPIRIN 325 MG PO TBEC: 325.0000 mg | DELAYED_RELEASE_TABLET | Freq: Every day | ORAL | 0 refills | Status: AC

## 2019-01-17 NOTE — Discharge Summary (Signed)
Physician Discharge Summary  Robert Clarke DUK:[id number] DOB: [birth year] DOA: 01/16/2019  PCP: Seward Carol, MD  Admit date: 01/16/2019 Discharge date: 01/18/2019  Admitted From: Home Discharge disposition: Home   Code Status: Prior   Recommendations for Outpatient Follow-Up:   1. 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 She does have been on between 12:30 to 1 PM, patient felt a sudden difficulty interpreting what he was reading.  He also noticed some word finding difficulty and could not say what he wanted to say.  By the time of presentation to the ED, he says his reading issues resolved but still felt having trouble forming words and sentences family.  No focal motor weakness or sensory symptoms. No history of TIA, stroke. No similar symptoms in the past. He had in the past left trigeminal neuralgia with chewing, sharp pain radiating to left eye with tearing. Happens infrequently every a couple of years. Last one was in 08/2018 and saw [name] at that time.  In the ED, patient was afebrile, heart rate in 50s and 60s regular, blood pressure 148/73 breathing comfortably on room air. Labs showed sodium level normal at 138, creatinine may be 1.07, hemoglobin 16.3, platelet 208.  Imagings: CT scan of the brain showed left frontalhypodensity-artifact versus early changes of stroke. Questionable hyperdense left MCA. CTA head and neck with no emergent LVO. All left MCA branches appear open. MRI brain -no acute intracranial abnormality. ECG showed normal sinus rhythm at 60 bpm with nonspecific ST-T wave changes.  QTC 436 ms. Echocardiogram did not show any evidence of cardioembolic source.  Hospital Course:   Possible TIA -Presented with word finding difficulty.  Symptoms self resolved. No focal motor or sensory deficits. -Imagings as above. -Neurology consult appreciated. -Hemoglobin A1c 5.4, LDL 130  -Neurology recommends 30-day cardiac event monitoring to rule out A. fib.  Referral page sent to [facility]. -Prior to admission, patient was on aspirin 81 mg at home.  Patient states that he is very susceptible to bleeding.  He gets easy bruising on minimal impact.  He is not willing to adding Plavix but would consider increasing aspirin to a full dose of 325 mg daily.   -Patient states that he had myalgia in the past when taking statin.  Zetia has been started.  Continue the same on discharge.  Hypertension -Resume losartan.  Hypothyroidism -Continue Synthroid.  History of allergic reaction to iodine -Prior to CTA , patient was premedicated with Solu-Medrol and Benadryl. -No symptoms postprocedure.   Subjective:  Patient seen and examined this morning.  Elderly Caucasian male.  Sitting up in chair.  Not in distress.  No recurrence of symptoms.  Mentions about his easy bruisability with aspirin and muscle pain with a statin.  Discharge Exam:   Vitals:   01/17/19 0316 01/17/19 0811 01/17/19 1137 01/17/19 1559  BP: 112/75 134/86 117/63 136/73  Pulse: 87 72 61 61  Resp: 15  16 16   Temp: 97.8 F (36.6 C) 98.5 F (36.9 C) 98.7 F (37.1 C) 98.1 F (36.7 C)  TempSrc: Oral Oral Oral Oral  SpO2: 98%  97% 100%  Weight:        Body mass index  is 28.26 kg/m.  General exam: Appears calm and comfortable.  Skin: No rashes, lesions or ulcers. HEENT: Atraumatic, normocephalic, supple neck, no obvious bleeding Lungs: Clear to auscultation bilaterally CVS: Regular rate and rhythm, no murmur GI/Abd soft, nontender, nondistended.  Bowel sound present CNS: Alert, awake, oriented  x3 Psychiatry: Mood appropriate Extremities: No pedal edema, no calf tenderness  Discharge Instructions:  Wound care: None Discharge Instructions    Ambulatory referral to Neurology   Complete by:  As directed    An appointment is requested in approximately: 4 weeks   Diet - low sodium heart healthy   Complete by:  As directed    Increase activity slowly   Complete by:  As directed      Follow-up Information    Kathrynn Ducking, MD. Schedule an appointment as soon as possible for a visit in 4 week(s).   Specialty:  Neurology Contact information: [place] [address] [phone]          Allergies as of 01/17/2019      Reactions   Iodine Rash   Iodine-131 Rash   Ivp Dye [iodinated Diagnostic Agents] Itching, Rash      Medication List    STOP taking these medications   telmisartan 80 MG tablet Commonly known as:  MICARDIS     TAKE these medications   aspirin 325 MG EC tablet Take 1 tablet (325 mg total) by mouth daily. What changed:    medication strength  how much to take   ezetimibe 10 MG tablet Commonly known as:  ZETIA Take 1 tablet (10 mg total) by mouth daily.   fluticasone 50 MCG/ACT nasal spray Commonly known as:  FLONASE Place 1 spray into both nostrils daily.   levothyroxine 137 MCG tablet Commonly known as:  SYNTHROID Take 137 mcg by mouth daily.   losartan 50 MG tablet Commonly known as:  COZAAR Take 50 mg by mouth daily.   omeprazole 40 MG capsule Commonly known as:  PRILOSEC Take 40 mg by mouth daily.       Time coordinating discharge: 25 minutes  The results of significant diagnostics from this hospitalization (including imaging, microbiology, ancillary and laboratory) are listed below for reference.    Procedures and Diagnostic Studies:   Ct Angio Head W Or Wo Contrast  Result Date: 01/16/2019 CLINICAL DATA:  Aphasia/word forming difficulty. Questionable left frontal infarct on CT. EXAM: CT  ANGIOGRAPHY HEAD AND NECK TECHNIQUE: Multidetector CT imaging of the head and neck was performed using the standard protocol during bolus administration of intravenous contrast. Multiplanar CT image reconstructions and MIPs were obtained to evaluate the vascular anatomy. Carotid stenosis measurements (when applicable) are obtained utilizing NASCET criteria, using the distal internal carotid diameter as the denominator. CONTRAST:  183mL OMNIPAQUE IOHEXOL 300 MG/ML  SOLN COMPARISON:  None. FINDINGS: CTA NECK FINDINGS Aortic arch: Standard 3 vessel aortic arch with mild atherosclerotic plaque. Widely patent arch vessel origins. Right carotid system: Patent without evidence of stenosis or dissection. Left carotid system: Patent without evidence of stenosis or dissection. Mild, predominantly calcified plaque at the carotid bifurcation. Vertebral arteries: Patent and codominant without evidence of stenosis or dissection. Skeleton: Bridging anterior vertebral osteophytes throughout the lower cervical and upper thoracic spine suggesting DISH. Other neck: Thyroidectomy. No evidence of neck mass or lymphadenopathy. Upper chest: Clear lung apices. Review of the MIP images confirms the above findings CTA HEAD FINDINGS Anterior circulation: The intracranial vertebral arteries are widely patent from skull base  to carotid termini with mild nonstenotic cavernous segment plaque primarily on the left. MCAs are patent without evidence of proximal branch occlusion or significant M1 stenosis. ACAs are patent without left A1 stenosis and with the right A1 segment being hypoplastic. No aneurysm is identified. Posterior circulation: The vertebral arteries are patent with mild stenosis near the left V3-V4 junction. Patent PICA and SCA origins are identified bilaterally. The basilar artery is widely patent. Posterior communicating arteries are not identified and may be diminutive or absent. PCAs are patent without evidence of significant  proximal stenosis. No aneurysm is identified. Venous sinuses: Not adequately evaluated due to arterial contrast timing. Anatomic variants: Hypoplastic right A1. Review of the MIP images confirms the above findings IMPRESSION: 1. No large vessel occlusion. 2. Mild intracranial atherosclerosis without significant proximal anterior circulation stenosis. 3. Mild left vertebral artery stenosis near the V3-V4 junction. 4. Widely patent cervical carotid and vertebral arteries. 5.  Aortic Atherosclerosis (ICD10-I70.0). These results were called by telephone at the time of interpretation on 01/16/2019 at 2:36 pm to Dr. Amie Portland , who verbally acknowledged these results. Electronically Signed   By: Logan Bores M.D.   On: 01/16/2019 14:49   Ct Angio Neck W Or Wo Contrast  Result Date: 01/16/2019 CLINICAL DATA:  Aphasia/word forming difficulty. Questionable left frontal infarct on CT. EXAM: CT ANGIOGRAPHY HEAD AND NECK TECHNIQUE: Multidetector CT imaging of the head and neck was performed using the standard protocol during bolus administration of intravenous contrast. Multiplanar CT image reconstructions and MIPs were obtained to evaluate the vascular anatomy. Carotid stenosis measurements (when applicable) are obtained utilizing NASCET criteria, using the distal internal carotid diameter as the denominator. CONTRAST:  119mL OMNIPAQUE IOHEXOL 300 MG/ML  SOLN COMPARISON:  None. FINDINGS: CTA NECK FINDINGS Aortic arch: Standard 3 vessel aortic arch with mild atherosclerotic plaque. Widely patent arch vessel origins. Right carotid system: Patent without evidence of stenosis or dissection. Left carotid system: Patent without evidence of stenosis or dissection. Mild, predominantly calcified plaque at the carotid bifurcation. Vertebral arteries: Patent and codominant without evidence of stenosis or dissection. Skeleton: Bridging anterior vertebral osteophytes throughout the lower cervical and upper thoracic spine suggesting  DISH. Other neck: Thyroidectomy. No evidence of neck mass or lymphadenopathy. Upper chest: Clear lung apices. Review of the MIP images confirms the above findings CTA HEAD FINDINGS Anterior circulation: The intracranial vertebral arteries are widely patent from skull base to carotid termini with mild nonstenotic cavernous segment plaque primarily on the left. MCAs are patent without evidence of proximal branch occlusion or significant M1 stenosis. ACAs are patent without left A1 stenosis and with the right A1 segment being hypoplastic. No aneurysm is identified. Posterior circulation: The vertebral arteries are patent with mild stenosis near the left V3-V4 junction. Patent PICA and SCA origins are identified bilaterally. The basilar artery is widely patent. Posterior communicating arteries are not identified and may be diminutive or absent. PCAs are patent without evidence of significant proximal stenosis. No aneurysm is identified. Venous sinuses: Not adequately evaluated due to arterial contrast timing. Anatomic variants: Hypoplastic right A1. Review of the MIP images confirms the above findings IMPRESSION: 1. No large vessel occlusion. 2. Mild intracranial atherosclerosis without significant proximal anterior circulation stenosis. 3. Mild left vertebral artery stenosis near the V3-V4 junction. 4. Widely patent cervical carotid and vertebral arteries. 5.  Aortic Atherosclerosis (ICD10-I70.0). These results were called by telephone at the time of interpretation on 01/16/2019 at 2:36 pm to Dr. Amie Portland , who verbally acknowledged these  results. Electronically Signed   By: Logan Bores M.D.   On: 01/16/2019 14:49   Mr Brain Wo Contrast  Result Date: 01/16/2019 CLINICAL DATA:  78 y/o M; episode of speech difficulty and dizziness. TIA, initial exam. EXAM: MRI HEAD WITHOUT CONTRAST TECHNIQUE: Multiplanar, multiecho pulse sequences of the brain and surrounding structures were obtained without intravenous contrast.  COMPARISON:  01/16/2019 CT head and CTA head. FINDINGS: Brain: No acute infarction, hemorrhage, hydrocephalus, extra-axial collection or mass lesion. Vascular: Normal flow voids. Skull and upper cervical spine: Normal marrow signal. Sinuses/Orbits: Mild-to-moderate diffuse paranasal sinus mucosal thickening greatest in the ethmoid air cells. No abnormal signal of the mastoid air cells. Bilateral intra-ocular lens replacement. Other: None. IMPRESSION: 1. No acute intracranial abnormality identified. Unremarkable of the brain. 2. Mild to moderate paranasal sinus disease greatest in the ethmoid air cells. Electronically Signed   By: Kristine Garbe M.D.   On: 01/16/2019 22:53   Ct Head Code Stroke Wo Contrast  Result Date: 01/16/2019 CLINICAL DATA:  Code stroke.  Aphasia. EXAM: CT HEAD WITHOUT CONTRAST TECHNIQUE: Contiguous axial images were obtained from the base of the skull through the vertex without intravenous contrast. COMPARISON:  Brain MRI 09/26/2018 FINDINGS: Brain: There is hypoattenuation anteriorly in the left frontal lobe in an area that is often prone to artifact, however the hypoattenuation is significantly asymmetric and cannot be definitively attributed to artifact on reformats. There is no evidence of acute infarct elsewhere. No intracranial hemorrhage, mass, midline shift, or extra-axial fluid collection is identified. Mild cerebral atrophy is within normal limits for age. A cavum septum pellucidum is incidentally noted. Vascular: Calcified atherosclerosis at the skull base. Mildly increased intracranial vascular density diffusely without suspicious focal hyperdensity. Skull: No fracture or focal osseous lesion. Sinuses/Orbits: Mild mucosal thickening in the paranasal sinuses, greatest in the ethmoid air cells bilaterally. Clear mastoid air cells. Bilateral cataract extraction. Other: None. ASPECTS ([place] Stroke Program Early CT Score) - Ganglionic level infarction (caudate, lentiform  nuclei, internal capsule, insula, M1-M3 cortex): 6 considering the possibility of a left frontal infarct - Supraganglionic infarction (M4-M6 cortex): 3 Total score (0-10 with 10 being normal): 9 IMPRESSION: 1. Infarct versus artifact in the anterior left frontal lobe. No intracranial hemorrhage. 2. ASPECTS is 9. These results were communicated to Dr. Rory Percy at 2:18 pm on 01/16/2019 by text page via the [facility] messaging system. Electronically Signed   By: Logan Bores M.D.   On: 01/16/2019 14:19     Labs:   Basic Metabolic Panel: Recent Labs  Lab 01/16/19 1358 01/16/19 1400  NA 138 134*  K 4.4 4.4  CL 101 100  CO2 26  --   GLUCOSE 92 87  BUN 12 15  CREATININE 1.07 1.00  CALCIUM 9.4  --    GFR Estimated Creatinine Clearance: 83.6 mL/min (by C-G formula based on SCr of 1 mg/dL). Liver Function Tests: Recent Labs  Lab 01/16/19 1358  AST 24  ALT 17  ALKPHOS 87  BILITOT 1.3*  PROT 7.0  ALBUMIN 4.1   No results for input(s): LIPASE, AMYLASE in the last 168 hours. No results for input(s): AMMONIA in the last 168 hours. Coagulation profile Recent Labs  Lab 01/16/19 1358  INR 1.0    CBC: Recent Labs  Lab 01/16/19 1358 01/16/19 1400  WBC 8.8  --   NEUTROABS 6.1  --   HGB 16.3 17.3*  HCT 51.4 51.0  MCV 90.3  --   PLT 208  --    Cardiac Enzymes:  No results for input(s): CKTOTAL, CKMB, CKMBINDEX, TROPONINI in the last 168 hours. BNP: Invalid input(s): POCBNP CBG: No results for input(s): GLUCAP in the last 168 hours. D-Dimer No results for input(s): DDIMER in the last 72 hours. Hgb A1c Recent Labs    01/17/19 0449  HGBA1C 5.4   Lipid Profile Recent Labs    01/17/19 0449  CHOL 195  HDL 49  LDLCALC 130*  TRIG 78  CHOLHDL 4.0   Thyroid function studies No results for input(s): TSH, T4TOTAL, T3FREE, THYROIDAB in the last 72 hours.  Invalid input(s): FREET3 Anemia work up No results for input(s): VITAMINB12, FOLATE, FERRITIN, TIBC, IRON, RETICCTPCT in the  last 72 hours. Microbiology Recent Results (from the past 240 hour(s))  SARS Coronavirus 2 (CEPHEID - Performed in [facility] hospital lab), Hosp Order     Status: None   Collection Time: 01/16/19  3:57 PM  Result Value Ref Range Status   SARS Coronavirus 2 NEGATIVE NEGATIVE Final    Comment: (NOTE) If result is NEGATIVE SARS-CoV-2 target nucleic acids are NOT DETECTED. The SARS-CoV-2 RNA is generally detectable in upper and lower  respiratory specimens during the acute phase of infection. The lowest  concentration of SARS-CoV-2 viral copies this assay can detect is 250  copies / mL. A negative result does not preclude SARS-CoV-2 infection  and should not be used as the sole basis for treatment or other  patient management decisions.  A negative result may occur with  improper specimen collection / handling, submission of specimen other  than nasopharyngeal swab, presence of viral mutation(s) within the  areas targeted by this assay, and inadequate number of viral copies  (<250 copies / mL). A negative result must be combined with clinical  observations, patient history, and epidemiological information. If result is POSITIVE SARS-CoV-2 target nucleic acids are DETECTED. The SARS-CoV-2 RNA is generally detectable in upper and lower  respiratory specimens dur ing the acute phase of infection.  Positive  results are indicative of active infection with SARS-CoV-2.  Clinical  correlation with patient history and other diagnostic information is  necessary to determine patient infection status.  Positive results do  not rule out bacterial infection or co-infection with other viruses. If result is PRESUMPTIVE POSTIVE SARS-CoV-2 nucleic acids MAY BE PRESENT.   A presumptive positive result was obtained on the submitted specimen  and confirmed on repeat testing.  While 2019 novel coronavirus  (SARS-CoV-2) nucleic acids may be present in the submitted sample  additional confirmatory testing  may be necessary for epidemiological  and / or clinical management purposes  to differentiate between  SARS-CoV-2 and other Sarbecovirus currently known to infect humans.  If clinically indicated additional testing with an alternate test  methodology [phone]) is advised. The SARS-CoV-2 RNA is generally  detectable in upper and lower respiratory sp ecimens during the acute  phase of infection. The expected result is Negative. Fact Sheet for Patients:  StrictlyIdeas.no Fact Sheet for Healthcare Providers: BankingDealers.co.za This test is not yet approved or cleared by the [place] FDA and has been authorized for detection and/or diagnosis of SARS-CoV-2 by FDA under an Emergency Use Authorization (EUA).  This EUA will remain in effect (meaning this test can be used) for the duration of the COVID-19 declaration under Section 564(b)(1) of the Act, 21 U.S.C. section 360bbb-3(b)(1), unless the authorization is terminated or revoked sooner. Performed at [facility], [place] [address], [place], [place] [address]     Signed: Marlowe Aschoff Dyann Goodspeed  [facility] 01/18/2019, 4:20 PM

## 2019-01-17 NOTE — Evaluation (Signed)
Speech Language Pathology Evaluation Patient Details Name: Robert Clarke MRN: [id number] DOB: 08-Jul-1941 Today's Date: 01/17/2019 Time: 4098-1191 SLP Time Calculation (min) (ACUTE ONLY): 18 min  Problem List:  Patient Active Problem List   Diagnosis Date Noted  . TIA (transient ischemic attack) 01/16/2019  . 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 Basal cell carcinoma of nose 08/06/2014   Past Medical History:  Past Medical History:  Diagnosis Date  . Acid reflux   . Cancer [facility])    prostate  . Hypertension   . 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 Thyroid disease    Past Surgical History:  Past Surgical History:  Procedure Laterality Date  . COLONOSCOPY WITH PROPOFOL N/A 04/13/2015   Procedure: COLONOSCOPY WITH PROPOFOL;  Surgeon: Garlan Fair, MD;  Location: [facility] ENDOSCOPY;  Service: Endoscopy;  Laterality: N/A;  . PROSTATECTOMY  2001  . 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 On 01/16/19 he developed sudden difficulty interpreting what he was reading, word finding difficulty, and could not say what he wanted. By the time of presentation to the ED, he reported that his reading issues resolved but he still felt he was having trouble forming words and sentences. MRI of the brain was negative for acute changes.   Assessment / Plan / Recommendation Clinical Impression  Pt reported that he was living independently prior to admission. He stated that he is currently retired but, prior to the pandemic, he still worked part-time as a Secondary school teacher. He denied any baseline deficits in speech or language but stated that he has noticed some deterioration in his memory as he has gotten  older. Per the pt, his symptoms of aphasia have resolved and his language skills are now back to baseline. Based on the assessment, his speech and language skills are currently within normal limits, and no overt cognitive deficits were noted. Further skilled SLP services are not clinically indicated at this time. Pt, and nursing were educated regarding results and recommendations; both parties verbalized understanding as well as agreement with plan of care.    SLP Assessment  SLP Recommendation/Assessment: Patient does not need any further Speech Lanaguage Pathology Services SLP Visit Diagnosis: Aphasia (R47.01)    Follow Up Recommendations  None    Frequency and Duration           SLP Evaluation Cognition  Overall Cognitive Status: Within Functional Limits for tasks assessed Arousal/Alertness: Awake/alert Orientation Level: Oriented X4 Attention: Focused;Sustained Focused Attention: Appears intact Sustained Attention: Appears intact Memory: Impaired Memory Impairment: Retrieval deficit;Decreased recall of new information(Immediate: 3/3; Delayed: 2/3; with cues: 1/1) Awareness: Appears intact Problem Solving: Appears intact(5/5) Executive Function: Reasoning Reasoning: Appears intact(3/3)       Comprehension  Auditory Comprehension Overall Auditory Comprehension: Appears within functional limits for tasks assessed Yes/No Questions: Within Functional Limits Basic Biographical Questions: (5/5) Complex Questions: (5/5) Paragraph Comprehension (via yes/no questions): (3/4) Commands: Within Functional Limits Two Step Basic Commands: (4/4) Multistep Basic Commands: (4/4) Visual Recognition/Discrimination Discrimination: Within Function Limits Reading Comprehension Reading Status: Within funtional limits    Expression Expression Primary Mode of Expression: Verbal Verbal Expression Overall Verbal Expression: Appears within functional limits for tasks assessed Initiation: No  impairment Automatic Speech: Counting;Day of week;Month of year(WNL) Level of Generative/Spontaneous Verbalization: [employer]  Repetition: No impairment(5/5) Naming: No impairment Responsive: (5/5) Confrontation: (10/10) Convergent: (Sentence completion: 5/5) Pragmatics: No impairment Written Expression Dominant Hand: Right   Oral / Motor  Oral Motor/Sensory Function Overall Oral Motor/Sensory Function: Within functional limits Motor Speech Overall Motor Speech: Appears within functional limits for tasks assessed Respiration: Within functional limits Phonation: Normal Resonance: Within functional limits Articulation: Within functional limitis Intelligibility: Intelligible Motor Planning: Witnin functional limits Motor Speech Errors: Not applicable   Robert Clarke, [place], [place] Office number [phone] Pager [place] 01/17/2019, 10:16 AM

## 2019-01-17 NOTE — TOC Initial Note (Signed)
Transition of Care [facility]) - Initial/Assessment Note    Patient Details  Name: Robert Clarke MRN: [id number] Date of Birth: [birth year]  Transition of Care [facility]) CM/SW Contact:    Pollie Friar, RN Phone Number: 01/17/2019, 1:19 PM  Clinical Narrative:                 Pt denies issues with his medications. He also denies issues with transportation.  Expected Discharge Plan: Home/Self Care Barriers to Discharge: Continued Medical Work up   Patient Goals and CMS Choice        Expected Discharge Plan and Services Expected Discharge Plan: Home/Self Care       Living arrangements for the past 2 months: Single Family Home(2 story but can stay on ground level)                                      Prior Living Arrangements/Services Living arrangements for the past 2 months: Single Family Home(2 story but can stay on ground level) Lives with:: Spouse Patient language and need for interpreter reviewed:: Yes(no needs) Do you feel safe going back to the place where you live?: Yes      Need for Family Participation in Patient Care: Yes (Comment)(intermittent supervision recommended) Care giver support system in place?: Yes (comment)(pt states wife can provide needed supervision)   Criminal Activity/Legal Involvement Pertinent to Current Situation/Hospitalization: No - Comment as needed  Activities of Daily Living Home Assistive Devices/Equipment: Dentures (specify type), Eyeglasses ADL Screening (condition at time of admission) Patient's cognitive ability adequate to safely complete daily activities?: Yes Is the patient deaf or have difficulty hearing?: No Does the patient have difficulty seeing, even when wearing glasses/contacts?: No Does the patient have difficulty concentrating, remembering, or making decisions?: No Patient able to express need for assistance with ADLs?: Yes Does the patient have difficulty dressing or bathing?: No Independently performs ADLs?: Yes  (appropriate for developmental age) Does the patient have difficulty walking or climbing stairs?: No Weakness of Legs: None Weakness of Arms/Hands: None  Permission Sought/Granted                  Emotional Assessment Appearance:: Appears stated age Attitude/Demeanor/Rapport: Engaged Affect (typically observed): Accepting Orientation: : Oriented to Self, Oriented to Place, Oriented to  Time, Oriented to Situation   Psych Involvement: No (comment)  Admission diagnosis:  Aphasia [R47.01] Patient Active Problem List   Diagnosis Date Noted  . TIA (transient ischemic attack) 01/16/2019  . 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 Basal cell carcinoma of nose 08/06/2014   PCP:  Seward Carol, MD Pharmacy:   [facility] [address] [place] [address] Phone: [phone] Fax: [phone]     Social Determinants of Health (SDOH) Interventions    Readmission Risk Interventions No flowsheet data found.

## 2019-01-17 NOTE — Progress Notes (Signed)
  Echocardiogram 2D Echocardiogram has been performed.  Robert Clarke 01/17/2019, 3:33 PM

## 2019-01-17 NOTE — Progress Notes (Signed)
STROKE TEAM PROGRESS NOTE   SUBJECTIVE (INTERVAL HISTORY) No family is at the bedside.  Pt sitting in chair. Recounted HPI with me. He had in the past left trigeminal neuralgia with chewing, sharp pain radiating to left eye with tearing. Happens infrequently every a couple of years. Last one was in 08/2018 and saw [name] at that time. Yesterday, no pain at all, he had 5-30min episode of dyslexia, wording finding difficulty. He admitted that he was anxious and panic at that time which may increased his speech difficulty. He is back to baseline now.    OBJECTIVE Vitals:   01/16/19 1959 01/16/19 2319 01/17/19 0125 01/17/19 0316  BP: 117/71 116/82 102/74 112/75  Pulse: 90 89  87  Resp: (!) 26 17  15   Temp: 98.7 F (37.1 C) 98.5 F (36.9 C)  97.8 F (36.6 C)  TempSrc: Oral Oral  Oral  SpO2:  95%  98%  Weight:        CBC:  Recent Labs  Lab 01/16/19 1358 01/16/19 1400  WBC 8.8  --   NEUTROABS 6.1  --   HGB 16.3 17.3*  HCT 51.4 51.0  MCV 90.3  --   PLT 208  --     Basic Metabolic Panel:  Recent Labs  Lab 01/16/19 1358 01/16/19 1400  NA 138 134*  K 4.4 4.4  CL 101 100  CO2 26  --   GLUCOSE 92 87  BUN 12 15  CREATININE 1.07 1.00  CALCIUM 9.4  --     Lipid Panel:     Component Value Date/Time   CHOL 195 01/17/2019 0449   TRIG 78 01/17/2019 0449   HDL 49 01/17/2019 0449   CHOLHDL 4.0 01/17/2019 0449   VLDL 16 01/17/2019 0449   LDLCALC 130 (H) 01/17/2019 0449   HgbA1c:  Lab Results  Component Value Date   HGBA1C 5.4 01/17/2019   Urine Drug Screen:     Component Value Date/Time   LABOPIA NONE DETECTED 01/16/2019 2338   COCAINSCRNUR NONE DETECTED 01/16/2019 2338   LABBENZ NONE DETECTED 01/16/2019 2338   AMPHETMU NONE DETECTED 01/16/2019 2338   THCU NONE DETECTED 01/16/2019 2338   LABBARB NONE DETECTED 01/16/2019 2338    Alcohol Level     Component Value Date/Time   ETH <10 01/16/2019 1358    IMAGING   Ct Angio Head W Or Wo Contrast  Result  Date: 01/16/2019 CLINICAL DATA:  Aphasia/word forming difficulty. Questionable left frontal infarct on CT. EXAM: CT ANGIOGRAPHY HEAD AND NECK TECHNIQUE: Multidetector CT imaging of the head and neck was performed using the standard protocol during bolus administration of intravenous contrast. Multiplanar CT image reconstructions and MIPs were obtained to evaluate the vascular anatomy. Carotid stenosis measurements (when applicable) are obtained utilizing NASCET criteria, using the distal internal carotid diameter as the denominator. CONTRAST:  11mL OMNIPAQUE IOHEXOL 300 MG/ML  SOLN COMPARISON:  None. FINDINGS: CTA NECK FINDINGS Aortic arch: Standard 3 vessel aortic arch with mild atherosclerotic plaque. Widely patent arch vessel origins. Right carotid system: Patent without evidence of stenosis or dissection. Left carotid system: Patent without evidence of stenosis or dissection. Mild, predominantly calcified plaque at the carotid bifurcation. Vertebral arteries: Patent and codominant without evidence of stenosis or dissection. Skeleton: Bridging anterior vertebral osteophytes throughout the lower cervical and upper thoracic spine suggesting DISH. Other neck: Thyroidectomy. No evidence of neck mass or lymphadenopathy. Upper chest: Clear lung apices. Review of the MIP images confirms the above findings CTA HEAD FINDINGS Anterior  circulation: The intracranial vertebral arteries are widely patent from skull base to carotid termini with mild nonstenotic cavernous segment plaque primarily on the left. MCAs are patent without evidence of proximal branch occlusion or significant M1 stenosis. ACAs are patent without left A1 stenosis and with the right A1 segment being hypoplastic. No aneurysm is identified. Posterior circulation: The vertebral arteries are patent with mild stenosis near the left V3-V4 junction. Patent PICA and SCA origins are identified bilaterally. The basilar artery is widely patent. Posterior  communicating arteries are not identified and may be diminutive or absent. PCAs are patent without evidence of significant proximal stenosis. No aneurysm is identified. Venous sinuses: Not adequately evaluated due to arterial contrast timing. Anatomic variants: Hypoplastic right A1. Review of the MIP images confirms the above findings IMPRESSION: 1. No large vessel occlusion. 2. Mild intracranial atherosclerosis without significant proximal anterior circulation stenosis. 3. Mild left vertebral artery stenosis near the V3-V4 junction. 4. Widely patent cervical carotid and vertebral arteries. 5.  Aortic Atherosclerosis (ICD10-I70.0). These results were called by telephone at the time of interpretation on 01/16/2019 at 2:36 pm to Dr. Amie Portland , who verbally acknowledged these results. Electronically Signed   By: Logan Bores M.D.   On: 01/16/2019 14:49   Ct Angio Neck W Or Wo Contrast  Result Date: 01/16/2019 CLINICAL DATA:  Aphasia/word forming difficulty. Questionable left frontal infarct on CT. EXAM: CT ANGIOGRAPHY HEAD AND NECK TECHNIQUE: Multidetector CT imaging of the head and neck was performed using the standard protocol during bolus administration of intravenous contrast. Multiplanar CT image reconstructions and MIPs were obtained to evaluate the vascular anatomy. Carotid stenosis measurements (when applicable) are obtained utilizing NASCET criteria, using the distal internal carotid diameter as the denominator. CONTRAST:  188mL OMNIPAQUE IOHEXOL 300 MG/ML  SOLN COMPARISON:  None. FINDINGS: CTA NECK FINDINGS Aortic arch: Standard 3 vessel aortic arch with mild atherosclerotic plaque. Widely patent arch vessel origins. Right carotid system: Patent without evidence of stenosis or dissection. Left carotid system: Patent without evidence of stenosis or dissection. Mild, predominantly calcified plaque at the carotid bifurcation. Vertebral arteries: Patent and codominant without evidence of stenosis or  dissection. Skeleton: Bridging anterior vertebral osteophytes throughout the lower cervical and upper thoracic spine suggesting DISH. Other neck: Thyroidectomy. No evidence of neck mass or lymphadenopathy. Upper chest: Clear lung apices. Review of the MIP images confirms the above findings CTA HEAD FINDINGS Anterior circulation: The intracranial vertebral arteries are widely patent from skull base to carotid termini with mild nonstenotic cavernous segment plaque primarily on the left. MCAs are patent without evidence of proximal branch occlusion or significant M1 stenosis. ACAs are patent without left A1 stenosis and with the right A1 segment being hypoplastic. No aneurysm is identified. Posterior circulation: The vertebral arteries are patent with mild stenosis near the left V3-V4 junction. Patent PICA and SCA origins are identified bilaterally. The basilar artery is widely patent. Posterior communicating arteries are not identified and may be diminutive or absent. PCAs are patent without evidence of significant proximal stenosis. No aneurysm is identified. Venous sinuses: Not adequately evaluated due to arterial contrast timing. Anatomic variants: Hypoplastic right A1. Review of the MIP images confirms the above findings IMPRESSION: 1. No large vessel occlusion. 2. Mild intracranial atherosclerosis without significant proximal anterior circulation stenosis. 3. Mild left vertebral artery stenosis near the V3-V4 junction. 4. Widely patent cervical carotid and vertebral arteries. 5.  Aortic Atherosclerosis (ICD10-I70.0). These results were called by telephone at the time of interpretation on 01/16/2019 at  2:36 pm to Dr. Amie Portland , who verbally acknowledged these results. Electronically Signed   By: Logan Bores M.D.   On: 01/16/2019 14:49   Mr Brain Wo Contrast  Result Date: 01/16/2019 CLINICAL DATA:  78 y/o M; episode of speech difficulty and dizziness. TIA, initial exam. EXAM: MRI HEAD WITHOUT CONTRAST  TECHNIQUE: Multiplanar, multiecho pulse sequences of the brain and surrounding structures were obtained without intravenous contrast. COMPARISON:  01/16/2019 CT head and CTA head. FINDINGS: Brain: No acute infarction, hemorrhage, hydrocephalus, extra-axial collection or mass lesion. Vascular: Normal flow voids. Skull and upper cervical spine: Normal marrow signal. Sinuses/Orbits: Mild-to-moderate diffuse paranasal sinus mucosal thickening greatest in the ethmoid air cells. No abnormal signal of the mastoid air cells. Bilateral intra-ocular lens replacement. Other: None. IMPRESSION: 1. No acute intracranial abnormality identified. Unremarkable of the brain. 2. Mild to moderate paranasal sinus disease greatest in the ethmoid air cells. Electronically Signed   By: Kristine Garbe M.D.   On: 01/16/2019 22:53   Ct Head Code Stroke Wo Contrast  Result Date: 01/16/2019 CLINICAL DATA:  Code stroke.  Aphasia. EXAM: CT HEAD WITHOUT CONTRAST TECHNIQUE: Contiguous axial images were obtained from the base of the skull through the vertex without intravenous contrast. COMPARISON:  Brain MRI 09/26/2018 FINDINGS: Brain: There is hypoattenuation anteriorly in the left frontal lobe in an area that is often prone to artifact, however the hypoattenuation is significantly asymmetric and cannot be definitively attributed to artifact on reformats. There is no evidence of acute infarct elsewhere. No intracranial hemorrhage, mass, midline shift, or extra-axial fluid collection is identified. Mild cerebral atrophy is within normal limits for age. A cavum septum pellucidum is incidentally noted. Vascular: Calcified atherosclerosis at the skull base. Mildly increased intracranial vascular density diffusely without suspicious focal hyperdensity. Skull: No fracture or focal osseous lesion. Sinuses/Orbits: Mild mucosal thickening in the paranasal sinuses, greatest in the ethmoid air cells bilaterally. Clear mastoid air cells.  Bilateral cataract extraction. Other: None. ASPECTS (Timpson Stroke Program Early CT Score) - Ganglionic level infarction (caudate, lentiform nuclei, internal capsule, insula, M1-M3 cortex): 6 considering the possibility of a left frontal infarct - Supraganglionic infarction (M4-M6 cortex): 3 Total score (0-10 with 10 being normal): 9 IMPRESSION: 1. Infarct versus artifact in the anterior left frontal lobe. No intracranial hemorrhage. 2. ASPECTS is 9. These results were communicated to Dr. Rory Percy at 2:18 pm on 01/16/2019 by text page via the [facility] messaging system. Electronically Signed   By: Logan Bores M.D.   On: 01/16/2019 14:19    Transthoracic Echocardiogram  Pending   PHYSICAL EXAM Temp:  [97.8 F (36.6 C)-98.9 F (37.2 C)] 98.7 F (37.1 C) (05/22 1137) Pulse Rate:  [56-90] 61 (05/22 1137) Resp:  [12-26] 16 (05/22 1137) BP: (102-156)/(63-86) 117/63 (05/22 1137) SpO2:  [95 %-99 %] 97 % (05/22 1137) Weight:  [106.7 kg] 106.7 kg (05/21 1400)  General - Well nourished, well developed, in no apparent distress.  Ophthalmologic - fundi not visualized due to noncooperation.  Cardiovascular - Regular rate and rhythm.  Mental Status -  Level of arousal and orientation to time, place, and person were intact. Language including expression, naming, repetition, comprehension was assessed and found intact. Attention span and concentration were normal. Fund of Knowledge was assessed and was intact.  Cranial Nerves II - XII - II - Visual field intact OU. III, IV, VI - Extraocular movements intact. V - Facial sensation intact bilaterally. VII - Facial movement intact bilaterally. VIII - Hearing & vestibular intact bilaterally.  X - Palate elevates symmetrically. XI - Chin turning & shoulder shrug intact bilaterally. XII - Tongue protrusion intact.  Motor Strength - The patient's strength was normal in all extremities and pronator drift was absent.  Bulk was normal and fasciculations were  absent.   Motor Tone - Muscle tone was assessed at the neck and appendages and was normal.  Reflexes - The patient's reflexes were symmetrical in all extremities and he had no pathological reflexes.  Sensory - Light touch, temperature/pinprick were assessed and were symmetrical.    Coordination - The patient had normal movements in the hands and feet with no ataxia or dysmetria.  Tremor was absent.  Gait and Station - deferred.   ASSESSMENT/PLAN Robert Clarke is a 78 y.o. male with history of trigeminal neuralgia vs. SUNCT, HTN, prostate cancer presenting with briefly episode of dyslexia and wording finding difficulty. He did not receive IV t-PA due to resolved symptoms.   Possible TIA  Resultant  Back to baseline  CT head unremarkable   MRI head - no acute infarct  CTA H&N - unremarkable  2D Echo - pending  Given symptoms like cortical TIA, recommend 30 day cardiac event monitoring to rule out afib  [employer] Virus 2 neg  LDL - 130  HgbA1c - 5.4  UDS - neg  VTE prophylaxis - lovenox  aspirin 81 mg daily prior to admission, now on aspirin 325 mg daily. Pt stated that he is very sensitive to antiplatelet with bleeding, will only increase ASA dose this time without starting plavix.  Patient counseled to be compliant with his antithrombotic medications  Ongoing aggressive stroke risk factor management  Therapy recommendations: none  Disposition:  Pending  Hypertension  Stable . Long-term BP goal normotensive  Hyperlipidemia  Lipid lowering medication PTA:  none  LDL 130, goal < 70  Hx of intolerance to lipitor or crestor - due to muscle pain  Current lipid lowering medication: zetia  Continue zetia at discharge  Other Stroke Risk Factors  Advanced age  Other Active Problems  Intermittent left trigeminal neuralgia vs. SUNCT - follow up with Dr. Olen Cordial day # 0  Neurology will sign off. Please call with questions. Pt will follow  up with [name] at [facility] in about 4 weeks. Thanks for the consult.  Rosalin Hawking, MD PhD Stroke Neurology 01/17/2019 12:35 PM   To contact Stroke Continuity provider, please refer to [email or web address] After hours, contact General Neurology

## 2019-01-17 NOTE — Evaluation (Signed)
Occupational Therapy Evaluation and Discharge Patient Details Name: Robert Clarke MRN: [id number] DOB: [birth year] Today's Date: 01/17/2019    History of Present Illness Patient is a 78 y/o male who presents with word finding difficulties and difficulty reading. Head CT- left frontal hypodensity-artifact versus early changes of stroke; questionable hyperdense left MCA. MRI-unremarkable. PMH includes HTN, ca, SUNCT (SUNCT (short unilateral neuralgiform headache, conjunctival inj/tear).   Clinical Impression   Pt is functioning independently in ADL and ADL transfers. He has returned to his baseline.    Follow Up Recommendations  No OT follow up    Equipment Recommendations  None recommended by OT    Recommendations for Other Services       Precautions / Restrictions Precautions Precautions: None Precaution Comments: no h/o falls Restrictions Weight Bearing Restrictions: No      Mobility Bed Mobility Overal bed mobility: Independent             General bed mobility comments: returned to bed  Transfers Overall transfer level: Independent Equipment used: None             General transfer comment: from chair and toilet    Balance Overall balance assessment: Modified Independent Sitting-balance support: Feet supported;No upper extremity supported Sitting balance-Leahy Scale: Good     Standing balance support: During functional activity Standing balance-Leahy Scale: Good                          ADL either performed or assessed with clinical judgement   ADL Overall ADL's : Independent                                             Vision Baseline Vision/History: No visual deficits       Perception     Praxis      Pertinent Vitals/Pain Pain Assessment: No/denies pain     Hand Dominance Right   Extremity/Trunk Assessment Upper Extremity Assessment Upper Extremity Assessment: Overall WFL for tasks assessed   Lower  Extremity Assessment Lower Extremity Assessment: Overall WFL for tasks assessed LLE Deficits / Details: Hx of achilles tear with toes curled into flexion- premorbid LLE Sensation: WNL LLE Coordination: WNL   Cervical / Trunk Assessment Cervical / Trunk Assessment: Normal   Communication Communication Communication: No difficulties   Cognition Arousal/Alertness: Awake/alert Behavior During Therapy: WFL for tasks assessed/performed Overall Cognitive Status: Within Functional Limits for tasks assessed                                     General Comments       Exercises     Shoulder Instructions      Home Living Family/patient expects to be discharged to:: Private residence Living Arrangements: Spouse/significant other Available Help at Discharge: Family;Available 24 hours/day Type of Home: House Home Access: Stairs to enter   Entrance Stairs-Rails: Right Home Layout: Two level;Able to live on main level with bedroom/bathroom     Bathroom Shower/Tub: Walk-in shower;Tub/shower unit   Bathroom Toilet: Handicapped height     Home Equipment: None      Lives With: Spouse    Prior Functioning/Environment Level of Independence: Independent        Comments: Works at [employer] part time but was laid off; drives. Cuts trees.  OT Problem List:        OT Treatment/Interventions:      OT Goals(Current goals can be found in the care plan section) Acute Rehab OT Goals Patient Stated Goal: to go home ASAP  OT Frequency:     Barriers to D/C:            Co-evaluation              AM-PAC OT "6 Clicks" Daily Activity     Outcome Measure Help from another person eating meals?: None Help from another person taking care of personal grooming?: None Help from another person toileting, which includes using toliet, bedpan, or urinal?: None Help from another person bathing (including washing, rinsing, drying)?: None Help from another person to  put on and taking off regular upper body clothing?: None Help from another person to put on and taking off regular lower body clothing?: None 6 Click Score: 24   End of Session    Activity Tolerance: Patient tolerated treatment well Patient left: in bed;with call bell/phone within reach  OT Visit Diagnosis: Muscle weakness (generalized) (M62.81)                Time: 1116-1130 OT Time Calculation (min): 14 min Charges:  OT General Charges $OT Visit: 1 Visit OT Evaluation $OT Eval Low Complexity: 1 Low  Nestor Lewandowsky, OTR/L Acute Rehabilitation Services Pager: [phone] Office: [phone]  Malka So 01/17/2019, 11:34 AM

## 2019-01-17 NOTE — Care Management Obs Status (Signed)
[facility] NOTIFICATION   Patient Details  Name: Robert Clarke MRN: [id number] Date of Birth: 05-14-1941   Medicare Observation Status Notification Given:  Yes    Pollie Friar, RN 01/17/2019, 3:38 PM

## 2019-01-17 NOTE — Evaluation (Signed)
Physical Therapy Evaluation Patient Details Name: Robert Clarke MRN: [id number] DOB: 07-12-1941 Today's Date: 01/17/2019   History of Present Illness  Patient is a 78 y/o male who presents with word finding difficulties and difficulty reading. Head CT- left frontal hypodensity-artifact versus early changes of stroke; questionable hyperdense left MCA. MRI-unremarkable. PMH includes HTN, ca, SUNCT (short unilateral neuralgiform headache, conjunctival inj/tear).  Clinical Impression  Patient tolerated transfers, gait training and stair training supervision-Mod I for safety. Tolerated higher level balance challenges with only mild deviations in gait, but no overt LOB. 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 Pt reports all deficits and symptoms have resolved. Education re: Be FAST. Encouraged walking to bathroom with nursing daily. Pt is functioning close to baseline and does not require skilled therapy services. Discharge from therapy. All education completed.  I have discussed the patient's current level of function related to stroke symptoms  with the patient. He acknowledges understanding of this and feels he and his wife can provide the level of care the patient will need at home.         Follow Up Recommendations No PT follow up;Supervision - Intermittent    Equipment Recommendations  None recommended by PT    Recommendations for Other Services       Precautions / Restrictions Precautions Precautions: Fall Precaution Comments: hx of achilles tear LLE, toes curl under which pt reports affects balance Restrictions Weight Bearing Restrictions: No      Mobility  Bed Mobility Overal bed mobility: Modified Independent             General bed mobility comments: No assist needed, HOB elevated, no use of rail.  Transfers Overall transfer level: Modified independent Equipment used: None             General transfer comment:  Stood from EOB without difficulty.   Ambulation/Gait Ambulation/Gait assistance: Supervision;Modified independent (Device/Increase time) Gait Distance (Feet): 300 Feet Assistive device: None Gait Pattern/deviations: Step-through pattern;Decreased stride length;Decreased weight shift to left   Gait velocity interpretation: 1.31 - 2.62 ft/sec, indicative of limited community ambulator General Gait Details: Mostly steady gait with balance challenges. Noted to have some difficulty with Left foot due to position of toes affecting balance minimally. See balance section.  Stairs Stairs: Yes Stairs assistance: Modified independent (Device/Increase time) Stair Management: One rail Left;Alternating pattern Number of Stairs: 5(x2 bouts) General stair comments: Good demo of safe technique using rail for support.  Wheelchair Mobility    Modified Rankin (Stroke Patients Only) Modified Rankin (Stroke Patients Only) Pre-Morbid Rankin Score: No significant disability Modified Rankin: No significant disability     Balance Overall balance assessment: Needs assistance Sitting-balance support: Feet supported;No upper extremity supported Sitting balance-Leahy Scale: Good     Standing balance support: During functional activity Standing balance-Leahy Scale: Good               High level balance activites: Backward walking;Direction changes;Turns;Sudden stops;Head turns High Level Balance Comments: Tolerated above with only mild deviations in gait but no overt LOB.              Pertinent Vitals/Pain Pain Assessment: No/denies pain    Home Living Family/patient expects to be discharged to:: Private residence Living Arrangements: Spouse/significant other Available Help at Discharge: Family;Available 24 hours/day Type of Home: House Home Access: Stairs to enter Entrance Stairs-Rails: Right   Home Layout: Two level;Able to live on main level with bedroom/bathroom Home Equipment:  None  Prior Function Level of Independence: Independent         Comments: Works at [employer] part time but was laid off; drives. Cuts trees.     Hand Dominance   Dominant Hand: Right    Extremity/Trunk Assessment   Upper Extremity Assessment Upper Extremity Assessment: Defer to OT evaluation    Lower Extremity Assessment Lower Extremity Assessment: Overall WFL for tasks assessed;LLE deficits/detail LLE Deficits / Details: Hx of achilles tear with toes curled into flexion- premorbid LLE Sensation: WNL LLE Coordination: WNL       Communication   Communication: No difficulties  Cognition Arousal/Alertness: Awake/alert Behavior During Therapy: WFL for tasks assessed/performed Overall Cognitive Status: Within Functional Limits for tasks assessed                                        General Comments      Exercises     Assessment/Plan    PT Assessment Patent does not need any further PT services  PT Problem List         PT Treatment Interventions      PT Goals (Current goals can be found in the Care Plan section)  Acute Rehab PT Goals Patient Stated Goal: to go home ASAP PT Goal Formulation: All assessment and education complete, DC therapy    Frequency     Barriers to discharge        Co-evaluation               AM-PAC PT "6 Clicks" Mobility  Outcome Measure Help needed turning from your back to your side while in a flat bed without using bedrails?: None Help needed moving from lying on your back to sitting on the side of a flat bed without using bedrails?: None Help needed moving to and from a bed to a chair (including a wheelchair)?: None Help needed standing up from a chair using your arms (e.g., wheelchair or bedside chair)?: None Help needed to walk in hospital room?: None Help needed climbing 3-5 steps with a railing? : A Little 6 Click Score: 23    End of Session Equipment Utilized During Treatment: Gait  belt Activity Tolerance: Patient tolerated treatment well Patient left: in chair;with call bell/phone within reach;with chair alarm set Nurse Communication: Mobility status PT Visit Diagnosis: Unsteadiness on feet (R26.81)    Time: 0802-0823 PT Time Calculation (min) (ACUTE ONLY): 21 min   Charges:   PT Evaluation $PT Eval Low Complexity: 1 Low          Wray Kearns, PT, DPT Acute Rehabilitation Services Pager [phone] Office [phone]      [place] 01/17/2019, 8:42 AM

## 2019-01-22 ENCOUNTER — Telehealth: Payer: Self-pay | Admitting: Neurology

## 2019-01-22 ENCOUNTER — Telehealth: Payer: Self-pay

## 2019-01-22 NOTE — Telephone Encounter (Signed)
Pt gave consent for VV on the phone/ Pt understands that although there may be some limitations with this type of visit, we will take all precautions to reduce any security or privacy concerns.  Pt understands that this will be treated like an in office visit and we will file with pt's insurance, and there may be a patient responsible charge related to this service. Sent email with provider link to [email or web address]

## 2019-01-22 NOTE — Telephone Encounter (Signed)
Patient has been scheduled for 02/25/19 at 8:30 am.

## 2019-01-22 NOTE — Telephone Encounter (Signed)
-----   Message from Kathrynn Ducking, MD sent at 01/21/2019 10:17 AM EDT ----- Need RV with this patient, should be face to face, in 3 or 4 weeks.

## 2019-01-24 DIAGNOSIS — I10 Essential (primary) hypertension: Secondary | ICD-10-CM | POA: Diagnosis not present

## 2019-01-24 DIAGNOSIS — E039 Hypothyroidism, unspecified: Secondary | ICD-10-CM | POA: Diagnosis not present

## 2019-01-24 DIAGNOSIS — G459 Transient cerebral ischemic attack, unspecified: Secondary | ICD-10-CM | POA: Diagnosis not present

## 2019-01-24 DIAGNOSIS — C61 Malignant neoplasm of prostate: Secondary | ICD-10-CM | POA: Diagnosis not present

## 2019-01-24 DIAGNOSIS — E7800 Pure hypercholesterolemia, unspecified: Secondary | ICD-10-CM | POA: Diagnosis not present

## 2019-02-25 ENCOUNTER — Ambulatory Visit: Payer: Medicare HMO | Admitting: Neurology

## 2019-02-26 NOTE — Progress Notes (Signed)
PATIENT: Robert Clarke DOB: [birth year]  REASON FOR VISIT: follow up HISTORY FROM: patient  HISTORY OF PRESENT ILLNESS: Today 02/27/19  Mr. Guse is a 78 year old male, last seen in our office in January 2020 by [name] for history of headaches on the left side for 25 years.  He was thought to have probable SUNCT type headache.  He had MRI of the brain in January 2020, was normal.  Today, he is here for follow-up from a new problem.  He went to the ER Jan 16, 2019 after noticing sudden onset difficulty reading with word finding difficulty at 1300.  A code stroke was activated.  NIH was 0.  He continued to have difficulty with his words, but felt it was improving.  He did not meet criteria for acute stroke treatment, he did not have focal deficits on exam.  He was admitted for TIA work-up.  While in the hospital, he had a CT head, MRI of the brain, CTA head and neck, all were unremarkable. His symptoms were concerning for cortical TIA. Dr. Erlinda Hong on discharge recommended 30-day cardiac monitoring to rule out A. Fib.  The event occurred in the setting of taking daily 81 mg aspirin, was discharged home on aspirin 325 mg daily.  He is very sensitive to antiplatelet, with bleeding.  It was decided to only increase aspirin versus combination of aspirin and Plavix.  Prior to hospital admission, he was not on any lipid-lowering medication, LDL was 130, goal <70.  He was started on Zetia. While in the hospital EKG showed sinus rhythm, 60 bpm.  Echocardiogram was done, did not show any evidence of cardioembolic source (I have attached the imaging reports below)  A1c 5.4, LDL 130  Today, he reports he has been doing well, has not had any recurrence of blood finding difficulty.  He described that the event that occurred in May, occurred after he had been sitting watching TV, he fell asleep.  His wife asked him to get a bag of shrimp.  When he was reading the shrimp bag, he felt like he could not read the words,  couldn't get his words out.  This lasted for 1 to 2 minutes.  He says he panicked, got scared.  He denies any slurred speech.  He did not have a headache at the time of the event. He did not have any other symptoms.  He says at the time, he was under stress, had been laid off, also had just been woken up by his wife.  He wonders if these factors could have contributed to the episode.  When he arrived to the hospital, his symptoms have resolved. He is scheduled to see his primary care doctor next week.  He has not started the Zetia.  He checks his blood pressure at home, is usually around 140/80.  He presents today for follow-up unaccompanied.  HISTORY  09/12/2018 [name]: Mr. Theiss is a 78 year old right-handed white male with a history of headaches on the left side of his face that date back about 25 years.  The patient indicates that the episodes may come on and last for 2 to 3 days and then disappear for months only to recur.  The patient has not gone a full year without any pain.  The patient had a more prolonged event that began in November 2019, again associated with sharp jabs of pain that would go from the mid left face through the eye and into the top of the  head on the left.  The episodes will be initiated by chewing.  The patient began having a new issue which involved sensitivity to light touch around the left forehead and around the left eye.  The pain was associated with a dull constant achy sensation.  Within the last 2 weeks, the pain is disappeared.  The patient reports no numbness or weakness of the face, arms, legs.  He has no visual changes or double vision or changes in speech or swallowing.  More recently in the last several days he did have one event of a round dark area in the left visual field that lasted about a half an hour and then disappeared.  This has never happened before.  The patient denies any significant balance problems or difficulty controlling the bowels or the bladder.   He does have some sinus drainage at times.  He claims that with that sharp jabs of pain he will have tearing of the left eye only.  He comes to this office for an evaluation.  REVIEW OF SYSTEMS: Out of a complete 14 system review of symptoms, the patient complains only of the following symptoms, and all other reviewed systems are negative.  ALLERGIES: Allergies  Allergen Reactions  . Iodine Rash  . Iodine-131 Rash  . Ivp Dye [Iodinated Diagnostic Agents] Itching and Rash    HOME MEDICATIONS: Outpatient Medications Prior to Visit  Medication Sig Dispense Refill  . aspirin EC 325 MG EC tablet Take 1 tablet (325 mg total) by mouth daily. 90 tablet 0  . fluticasone (FLONASE) 50 MCG/ACT nasal spray Place 1 spray into both nostrils daily.     Marland Kitchen levothyroxine (SYNTHROID, LEVOTHROID) 137 MCG tablet Take 137 mcg by mouth daily.     Marland Kitchen omeprazole (PRILOSEC) 40 MG capsule Take 40 mg by mouth daily.     Marland Kitchen telmisartan (MICARDIS) 80 MG tablet Take 80 mg by mouth daily.    Marland Kitchen ezetimibe (ZETIA) 10 MG tablet Take 1 tablet (10 mg total) by mouth daily. 90 tablet 0  . losartan (COZAAR) 50 MG tablet Take 50 mg by mouth daily.     No facility-administered medications prior to visit.     PAST MEDICAL HISTORY: Past Medical History:  Diagnosis Date  . Acid reflux   . Cancer [facility])    prostate  . Hypertension   . 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 Thyroid disease     PAST SURGICAL HISTORY: Past Surgical History:  Procedure Laterality Date  . COLONOSCOPY WITH PROPOFOL N/A 04/13/2015   Procedure: COLONOSCOPY WITH PROPOFOL;  Surgeon: Garlan Fair, MD;  Location: [facility];  Service: Endoscopy;  Laterality: N/A;  . PROSTATECTOMY  2001  . THYROID SURGERY      FAMILY HISTORY: Family History  Problem Relation Age of Onset  . Cancer - Colon Father     SOCIAL HISTORY: Social History   Socioeconomic History  . Marital status: Married    Spouse  name: Not on file  . Number of children: Not on file  . Years of education: Not on file  . Highest education level: Some college, no degree  Occupational History  . Occupation: [employer]   Social Needs  . Financial resource strain: Not on file  . Food insecurity    Worry: Not on file    Inability: Not on file  . Transportation needs    Medical: Not on file    Non-medical: Not on file  Tobacco Use  .  Smoking status: Never Smoker  . Smokeless tobacco: Never Used  Substance and Sexual Activity  . Alcohol use: Yes  . Drug use: No  . Sexual activity: Not on file  Lifestyle  . Physical activity    Days per week: Not on file    Minutes per session: Not on file  . Stress: Not on file  Relationships  . Social Herbalist on phone: Not on file    Gets together: Not on file    Attends religious service: Not on file    Active member of club or organization: Not on file    Attends meetings of clubs or organizations: Not on file    Relationship status: Not on file  . Intimate partner violence    Fear of current or ex partner: Not on file    Emotionally abused: Not on file    Physically abused: Not on file    Forced sexual activity: Not on file  Other Topics Concern  . Not on file  Social History Narrative   Right handed   Caffeine 5 cups daily    Lives at home with wife       PHYSICAL EXAM  Vitals:   02/27/19 0802  BP: (!) 143/84  Pulse: 67  Temp: 97.7 F (36.5 C)  Weight: 232 lb 12.8 oz (105.6 kg)  Height: 6' 3.5" (1.918 m)   Body mass index is 28.71 kg/m.  Generalized: Well developed, in no acute distress   Neurological examination  Mentation: Alert oriented to time, place, history taking. Follows all commands speech and language fluent Cranial nerve II-XII: Pupils were equal round reactive to light. Extraocular movements were full, visual field were full on confrontational test. Facial sensation and strength were normal. Uvula tongue midline. Head  turning and shoulder shrug  were normal and symmetric. Motor: The motor testing reveals 5 over 5 strength of all 4 extremities. Good symmetric motor tone is noted throughout.  Sensory: Sensory testing is intact to soft touch on all 4 extremities. No evidence of extinction is noted.  Coordination: Cerebellar testing reveals good finger-nose-finger and heel-to-shin bilaterally.  Gait and station: Gait is normal. Tandem gait is mildly unsteady. Romberg is negative. No drift is seen.  Reflexes: Deep tendon reflexes are symmetric and normal bilaterally.   DIAGNOSTIC DATA (LABS, IMAGING, TESTING) - I reviewed patient records, labs, notes, testing and imaging myself where available.  Lab Results  Component Value Date   WBC 8.8 01/16/2019   HGB 17.3 (H) 01/16/2019   HCT 51.0 01/16/2019   MCV 90.3 01/16/2019   PLT 208 01/16/2019      Component Value Date/Time   NA 134 (L) 01/16/2019 1400   NA 137 09/12/2018 1014   K 4.4 01/16/2019 1400   CL 100 01/16/2019 1400   CO2 26 01/16/2019 1358   GLUCOSE 87 01/16/2019 1400   BUN 15 01/16/2019 1400   BUN 12 09/12/2018 1014   CREATININE 1.00 01/16/2019 1400   CALCIUM 9.4 01/16/2019 1358   PROT 7.0 01/16/2019 1358   PROT 6.7 09/12/2018 1014   ALBUMIN 4.1 01/16/2019 1358   ALBUMIN 4.3 09/12/2018 1014   AST 24 01/16/2019 1358   ALT 17 01/16/2019 1358   ALKPHOS 87 01/16/2019 1358   BILITOT 1.3 (H) 01/16/2019 1358   BILITOT 0.8 09/12/2018 1014   GFRNONAA >60 01/16/2019 1358   GFRAA >60 01/16/2019 1358   Lab Results  Component Value Date   CHOL 195 01/17/2019   HDL  49 01/17/2019   LDLCALC 130 (H) 01/17/2019   TRIG 78 01/17/2019   CHOLHDL 4.0 01/17/2019   Lab Results  Component Value Date   HGBA1C 5.4 01/17/2019   No results found for: VITAMINB12 No results found for: TSH  CT head 01/16/2019 IMPRESSION: 1. Infarct versus artifact in the anterior left frontal lobe. No intracranial hemorrhage. 2. ASPECTS is 9.  CT angio head and neck  01/16/2019 IMPRESSION: 1. No large vessel occlusion. 2. Mild intracranial atherosclerosis without significant proximal anterior circulation stenosis. 3. Mild left vertebral artery stenosis near the V3-V4 junction. 4. Widely patent cervical carotid and vertebral arteries. 5.  Aortic Atherosclerosis (ICD10-I70.0).  MRI of the brain 01/16/2019 IMPRESSION: 1. No acute intracranial abnormality identified. Unremarkable of the brain. 2. Mild to moderate paranasal sinus disease greatest in the ethmoid air cells.  Echocardiogram complete 01/17/2019  1. The left ventricle has normal systolic function with an ejection fraction of 60-65%. The cavity size was normal. There is mildly increased left ventricular wall thickness. Left ventricular diastolic Doppler parameters are consistent with impaired  relaxation. Indeterminate filling pressures.  2. The right ventricle has normal systolic function. The cavity was normal. There is no increase in right ventricular wall thickness.  3. Left atrial size was mildly dilated.  4. Right atrial size was severely dilated.  5. There is mild mitral annular calcification present.  6. The aortic valve is tricuspid. Mild thickening of the aortic valve. Mild calcification of the aortic valve. Aortic valve regurgitation was not assessed by color flow Doppler.   ASSESSMENT AND PLAN 78 y.o. year old male  has a past medical history of Acid reflux, Cancer ([place]), Hypertension, SUNCT (short unilateral neuralgiform headache, conjunctival inj/tear) (09/12/2018), and Thyroid disease. here with:  1.  Possible TIA, 01/16/2019, hospitalized for 1 day, word finding difficulty  The possible TIA occurred in the setting of 81 mg aspirin daily.  He will continue taking aspirin 325 mg daily, for stroke prevention.  He says he is sensitive to antiplatelet medications.  He will monitor for bleeding. I will order a 30-day cardiac event monitoring to rule out A. fib.  I have encouraged him to  start Zetia for LDL of 130, goal less than 70. He will discuss with Dr. Delfina Redwood. In the past when he was on a statin, he reported myalgia.  He will continue close follow-up with his primary care doctor to manage risk factors.  His blood pressure runs around 140/80, he will continue taking telmisartan, ideally his BP is < 130/90.  He will follow-up in 3 to 4 months with [name], for headache follow-up/TIA,    I spent 15 minutes with the patient. 50% of this time was spent discussing his plan of care, reviewing his record.   Butler Denmark, AGNP-C, DNP 02/27/2019, 8:36 AM [facility] [address], [place], [place] [address] [phone]

## 2019-02-27 ENCOUNTER — Ambulatory Visit: Payer: Medicare HMO | Admitting: Neurology

## 2019-02-27 ENCOUNTER — Other Ambulatory Visit: Payer: Self-pay

## 2019-02-27 ENCOUNTER — Encounter: Payer: Self-pay | Admitting: Neurology

## 2019-02-27 VITALS — BP 143/84 | HR 67 | Temp 97.7°F | Ht 75.5 in | Wt 232.8 lb

## 2019-02-27 DIAGNOSIS — G459 Transient cerebral ischemic attack, unspecified: Secondary | ICD-10-CM

## 2019-02-27 NOTE — Patient Instructions (Signed)
Continue taking 325 mg aspirin. I will order the 30-day cardiac monitoring study to determine if there may be underlying AFIB that caused the event in May. Please discuss Zetia, BP management with Dr. Delfina Redwood.    Transient Ischemic Attack  A transient ischemic attack (TIA) is a "warning stroke" that causes stroke-like symptoms that go away quickly. A TIA does not cause lasting damage to the brain. But having a TIA is a sign that you may be at risk for a stroke. Lifestyle changes and medical treatments can help prevent a stroke. It is important to know the symptoms of a TIA and what to do. Get help right away, even if your symptoms go away. The symptoms of a TIA are the same as those of a stroke. They can happen fast, and they usually go away within minutes or hours. They can include:  Weakness or loss of feeling in your face, arm, or leg. This often happens on one side of your body.  Trouble walking.  Trouble moving your arms or legs.  Trouble talking or understanding what people are saying.  Trouble seeing.  Seeing two of one object (double vision).  Feeling dizzy.  Feeling confused.  Loss of balance or coordination.  Feeling sick to your stomach (nauseous) and throwing up (vomiting).  A very bad headache for no reason. What increases the risk? Certain things may make you more likely to have a TIA. Some of these are things that you can change, such as:  Being very overweight (obese).  Using products that contain nicotine or tobacco, such as cigarettes and e-cigarettes.  Taking birth control pills.  Not being active.  Drinking too much alcohol.  Using drugs. Other risk factors include:  Having an irregular heartbeat (atrial fibrillation).  Being African American or Hispanic.  Having had blood clots, stroke, TIA, or heart attack in the past.  Being a woman with a history of high blood pressure in pregnancy (preeclampsia).  Being over the age of 74.  Being male.   Having family history of stroke.  Having the following diseases or conditions: ? High blood pressure. ? High cholesterol. ? Diabetes. ? Heart disease. ? Sickle cell disease. ? Sleep apnea. ? Migraine headache. ? Long-term (chronic) diseases that cause soreness and swelling (inflammation). ? Disorders that affect how your blood clots. Follow these instructions at home: Medicines   Take over-the-counter and prescription medicines only as told by your doctor.  If you were told to take aspirin or another medicine to thin your blood, take it exactly as told by your doctor. ? Taking too much of the medicine can cause bleeding. ? Taking too little of the medicine may not work to treat the problem. Eating and drinking   Eat 5 or more servings of fruits and vegetables each day.  Follow instructions from your doctor about your diet. You may need to follow a certain diet to help lower your risk of having a stroke. You may need to: ? Eat a diet that is low in fat and salt. ? Eat foods that contain a lot of fiber. ? Limit the amount of carbohydrates and sugar in your diet.  Limit alcohol intake to 1 drink a day for nonpregnant women and 2 drinks a day for men. One drink equals 12 oz of beer, 5 oz of wine, or 1 oz of hard liquor. General instructions  Keep a healthy weight.  Stay active. Try to get at least 30 minutes of activity on all or  most days.  Find out if you have a condition called sleep apnea. Get treatment if needed.  Do not use any products that contain nicotine or tobacco, such as cigarettes and e-cigarettes. If you need help quitting, ask your doctor.  Do not abuse drugs.  Keep all follow-up visits as told by your doctor. This is important. Get help right away if:  You have any signs of stroke. "BE FAST" is an easy way to remember the main warning signs: ? B - Balance. Signs are dizziness, sudden trouble walking, or loss of balance. ? E - Eyes. Signs are trouble  seeing or a sudden change in how you see. ? F - Face. Signs are sudden weakness or loss of feeling of the face, or the face or eyelid drooping on one side. ? A - Arms. Signs are weakness or loss of feeling in an arm. This happens suddenly and usually on one side of the body. ? S - Speech. Signs are sudden trouble speaking, slurred speech, or trouble understanding what people say. ? T - Time. Time to call emergency services. Write down what time symptoms started.  You have other signs of stroke, such as: ? A sudden, very bad headache with no known cause. ? Feeling sick to your stomach (nausea). ? Throwing up (vomiting). ? Jerky movements that you cannot control (seizure). These symptoms may be an emergency. Do not wait to see if the symptoms will go away. Get medical help right away. Call your local emergency services (911 in the [place] Do not drive yourself to the hospital. Summary  A transient ischemic attack (TIA) is a "warning stroke" that causes stroke-like symptoms that go away quickly.  A TIA is a medical emergency. Get help right away, even if your symptoms go away.  A TIA does not cause lasting damage to the brain.  Having a TIA is a sign that you may be at risk for a stroke. Lifestyle changes and medical treatments can help prevent a stroke. This information is not intended to replace advice given to you by your health care provider. Make sure you discuss any questions you have with your health care provider. Document Released: 05/23/2008 Document Revised: 05/10/2018 Document Reviewed: 11/15/2016 Elsevier Patient Education  2020 [employer].

## 2019-02-27 NOTE — Progress Notes (Signed)
I have read the note, and I agree with the clinical assessment and plan.  Robert Clarke   

## 2019-03-03 ENCOUNTER — Telehealth: Payer: Self-pay | Admitting: *Deleted

## 2019-03-03 NOTE — Telephone Encounter (Signed)
[employer] to ship 30 day cardiac event monitor to patients home. Instructions reviewed briefly as they are also included in the monitor kit.

## 2019-03-06 DIAGNOSIS — E039 Hypothyroidism, unspecified: Secondary | ICD-10-CM | POA: Diagnosis not present

## 2019-03-06 DIAGNOSIS — I10 Essential (primary) hypertension: Secondary | ICD-10-CM | POA: Diagnosis not present

## 2019-03-06 DIAGNOSIS — E7800 Pure hypercholesterolemia, unspecified: Secondary | ICD-10-CM | POA: Diagnosis not present

## 2019-03-06 DIAGNOSIS — G459 Transient cerebral ischemic attack, unspecified: Secondary | ICD-10-CM | POA: Diagnosis not present

## 2019-03-08 ENCOUNTER — Ambulatory Visit (INDEPENDENT_AMBULATORY_CARE_PROVIDER_SITE_OTHER): Payer: Medicare HMO

## 2019-03-08 DIAGNOSIS — G459 Transient cerebral ischemic attack, unspecified: Secondary | ICD-10-CM

## 2019-03-08 DIAGNOSIS — I4891 Unspecified atrial fibrillation: Secondary | ICD-10-CM

## 2019-03-13 DIAGNOSIS — C61 Malignant neoplasm of prostate: Secondary | ICD-10-CM | POA: Diagnosis not present

## 2019-03-13 DIAGNOSIS — I10 Essential (primary) hypertension: Secondary | ICD-10-CM | POA: Diagnosis not present

## 2019-03-13 DIAGNOSIS — G459 Transient cerebral ischemic attack, unspecified: Secondary | ICD-10-CM | POA: Diagnosis not present

## 2019-03-13 DIAGNOSIS — Z1389 Encounter for screening for other disorder: Secondary | ICD-10-CM | POA: Diagnosis not present

## 2019-03-13 DIAGNOSIS — Z0000 Encounter for general adult medical examination without abnormal findings: Secondary | ICD-10-CM | POA: Diagnosis not present

## 2019-03-13 DIAGNOSIS — E7800 Pure hypercholesterolemia, unspecified: Secondary | ICD-10-CM | POA: Diagnosis not present

## 2019-03-13 DIAGNOSIS — E039 Hypothyroidism, unspecified: Secondary | ICD-10-CM | POA: Diagnosis not present

## 2019-04-10 DIAGNOSIS — R69 Illness, unspecified: Secondary | ICD-10-CM | POA: Diagnosis not present

## 2019-04-15 ENCOUNTER — Other Ambulatory Visit: Payer: Self-pay | Admitting: Neurology

## 2019-04-15 DIAGNOSIS — I4891 Unspecified atrial fibrillation: Secondary | ICD-10-CM

## 2019-04-15 DIAGNOSIS — G459 Transient cerebral ischemic attack, unspecified: Secondary | ICD-10-CM

## 2019-04-17 ENCOUNTER — Telehealth: Payer: Self-pay | Admitting: Neurology

## 2019-04-17 NOTE — Telephone Encounter (Signed)
I have not received a report as of yet. Have him please call back on Monday to check if we received the report yet. Thank you!

## 2019-04-17 NOTE — Telephone Encounter (Signed)
Patient calling about his 30 day cardiac event monitor results . Please [phone] .

## 2019-04-24 NOTE — Telephone Encounter (Signed)
Called patient and spoke to him and relayed that I had been checking  every day and results still are not ready yet.

## 2019-04-24 NOTE — Telephone Encounter (Signed)
I sent a staff message to [facility], to see what I can find out.

## 2019-04-24 NOTE — Telephone Encounter (Signed)
I received staff message back from [place], and she sent reminder to Dr. Rayann Heman to read.  I LMVM for pt regarding this and will watch for results.

## 2019-04-28 NOTE — Telephone Encounter (Signed)
Checked this am not read as yet.

## 2019-04-30 ENCOUNTER — Telehealth: Payer: Self-pay | Admitting: Neurology

## 2019-04-30 NOTE — Telephone Encounter (Signed)
I have contacted shelly at [facility] and she sent message to Dr. Marlou Porch to read report.  I donot see that MD as signed off yet.

## 2019-04-30 NOTE — Telephone Encounter (Signed)
I called the patient.  The patient had an unremarkable cardiac monitor study.  He is on aspirin therapy and will remain on this.   Cardiac monitor 04/30/19:  Narrative & Impression    NSR No significant arrhythmia  No PAF

## 2019-05-29 ENCOUNTER — Ambulatory Visit: Payer: Medicare HMO | Admitting: Neurology

## 2019-06-12 DIAGNOSIS — R69 Illness, unspecified: Secondary | ICD-10-CM | POA: Diagnosis not present

## 2019-06-26 DIAGNOSIS — R194 Change in bowel habit: Secondary | ICD-10-CM | POA: Diagnosis not present

## 2019-06-27 DIAGNOSIS — R194 Change in bowel habit: Secondary | ICD-10-CM | POA: Diagnosis not present

## 2019-07-03 DIAGNOSIS — C44612 Basal cell carcinoma of skin of right upper limb, including shoulder: Secondary | ICD-10-CM | POA: Diagnosis not present

## 2019-07-03 DIAGNOSIS — L570 Actinic keratosis: Secondary | ICD-10-CM | POA: Diagnosis not present

## 2019-07-03 DIAGNOSIS — L821 Other seborrheic keratosis: Secondary | ICD-10-CM | POA: Diagnosis not present

## 2019-07-20 IMAGING — MR MRI HEAD WITHOUT CONTRAST
10 of 11 series · 43 of 48 positions shown · non-contrast
Comparison: 01/16/2019 CT head and CTA head.

CLINICAL DATA: 78 y/o M; episode of speech difficulty and
dizziness. TIA, initial exam.

EXAM:
MRI HEAD WITHOUT CONTRAST
TECHNIQUE: Multiplanar, multiecho pulse sequences of the brain and surrounding
structures were obtained without intravenous contrast.

[Series 9: DWI · axial · 3.0mm · 0.88mm/px · z∈[-109,+44]mm · 9 of 106 slices shown (1 of 4)]
[im 1/106]
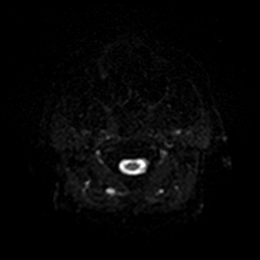
[im 14/106]
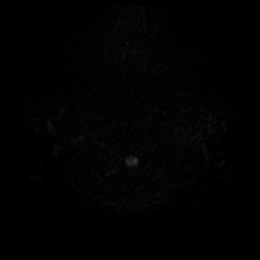
[im 27/106]
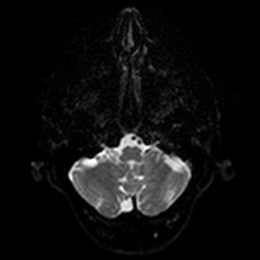
[im 40/106]
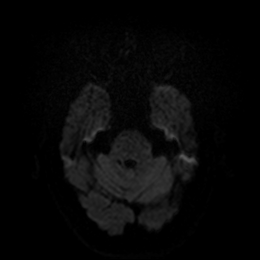
[im 53/106]
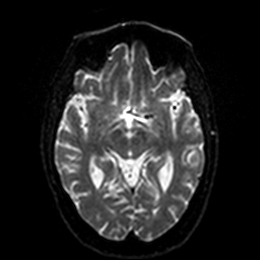
[im 66/106]
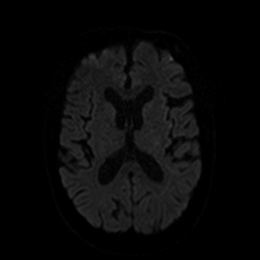
[im 79/106]
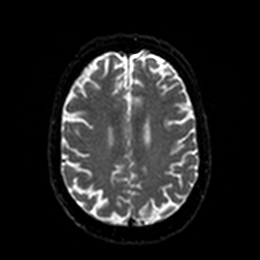
[im 92/106]
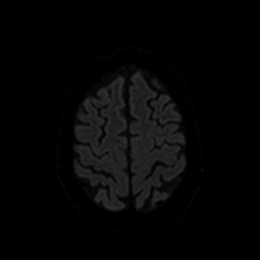
[im 106/106]
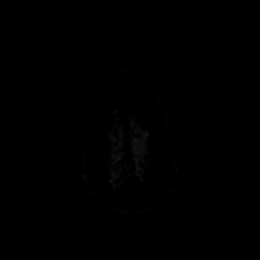

[Series 10: DWI · axial · 3.0mm · 0.88mm/px · z∈[-109,+44]mm · 4 of 53 slices shown (2 of 4)]
[im 1/53]
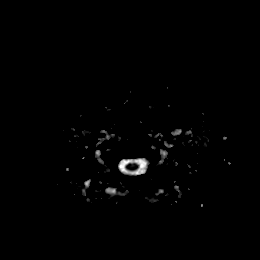
[im 18/53]
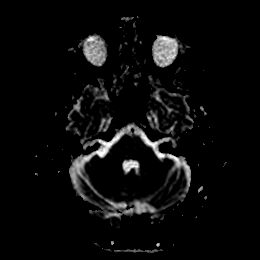
[im 35/53]
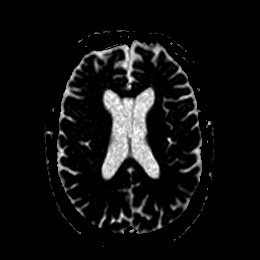
[im 53/53]
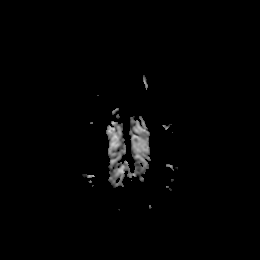

[Series 11: FLAIR · axial · 5.0mm · 0.45mm/px · z∈[-100,+56]mm · 3 of 28 slices shown]
[im 1/28]
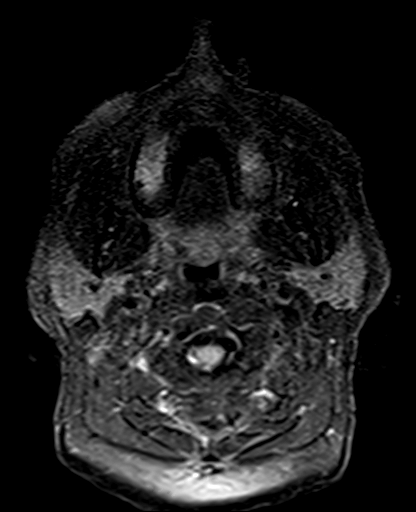
[im 14/28]
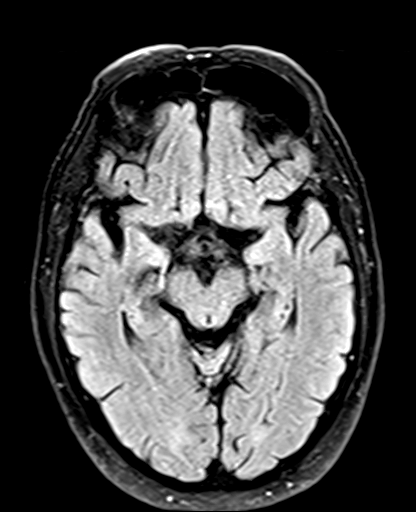
[im 28/28]
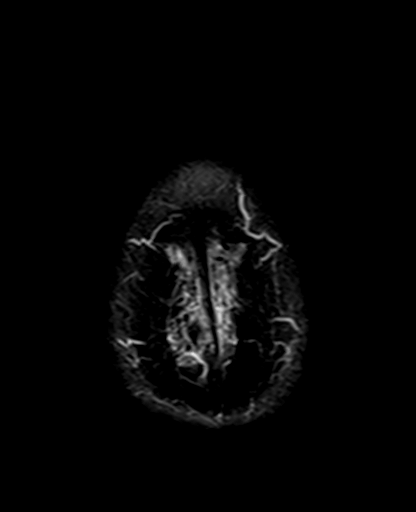

[Series 13: pha_images · axial · 3.0mm · 0.90mm/px · z∈[-103,+51]mm · 5 of 54 slices shown]
[im 1/54]
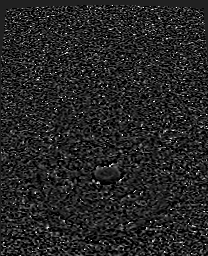
[im 14/54]
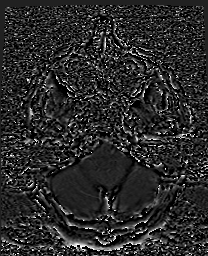
[im 27/54]
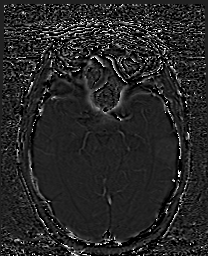
[im 40/54]
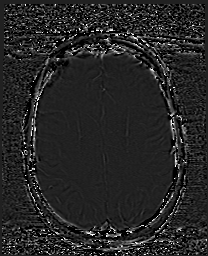
[im 54/54]
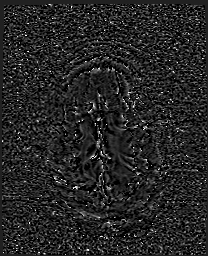

[Series 14: swi_images · axial · 3.0mm · 0.90mm/px · z∈[-103,+57]mm · 5 of 56 slices shown]
[im 1/56]
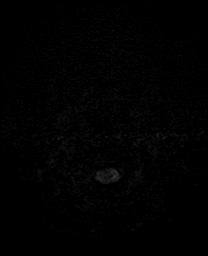
[im 14/56]
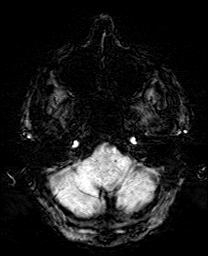
[im 28/56]
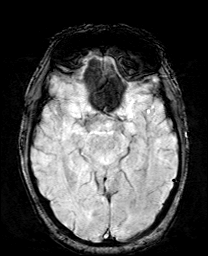
[im 42/56]
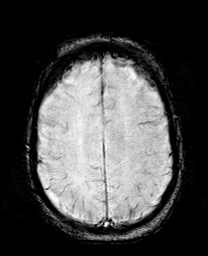
[im 56/56]
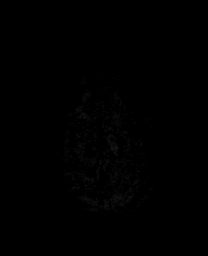

[Series 16: T2 · axial · 5.0mm · 0.72mm/px · z∈[-101,+55]mm · 3 of 28 slices shown (1 of 2)]
[im 1/28]
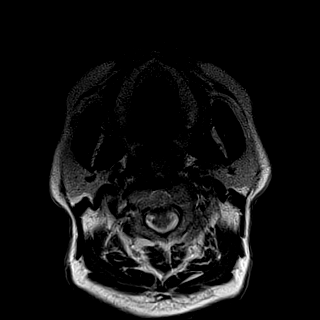
[im 14/28]
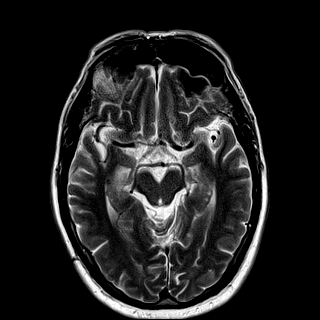
[im 28/28]
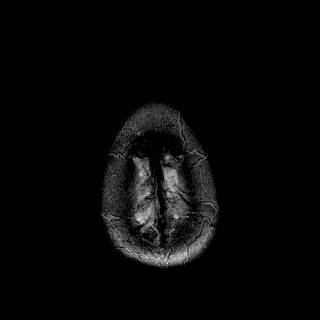

[Series 17: DWI · coronal · 4.0mm · 0.88mm/px · 6 of 68 slices shown (3 of 4)]
[im 1/68]
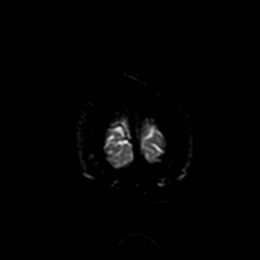
[im 14/68]
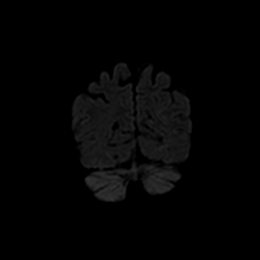
[im 27/68]
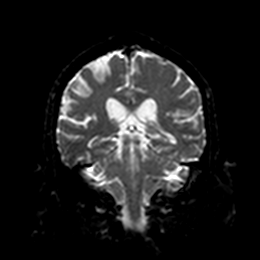
[im 41/68]
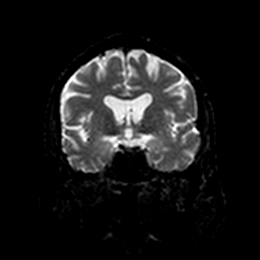
[im 54/68]
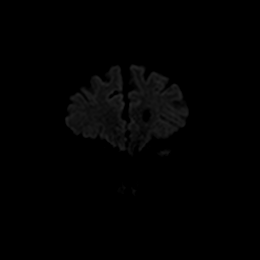
[im 68/68]
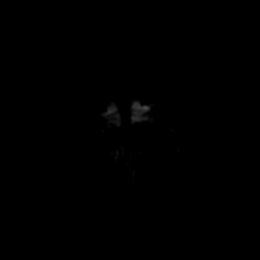

[Series 18: DWI · coronal · 4.0mm · 0.88mm/px · 3 of 34 slices shown (4 of 4)]
[im 1/34]
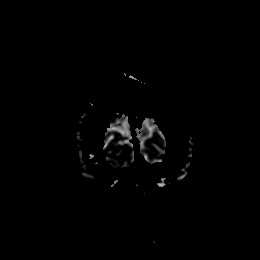
[im 17/34]
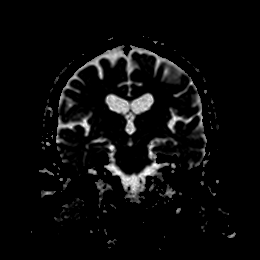
[im 34/34]
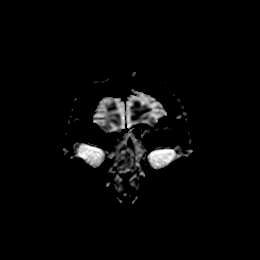

[Series 19: T1 · sagittal · 5.0mm · 0.78mm/px · 2 of 23 slices shown]
[im 1/23]
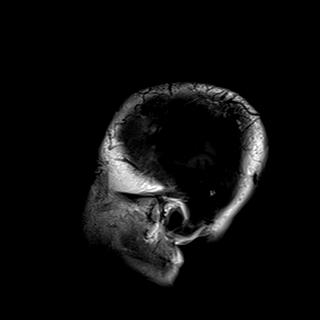
[im 23/23]
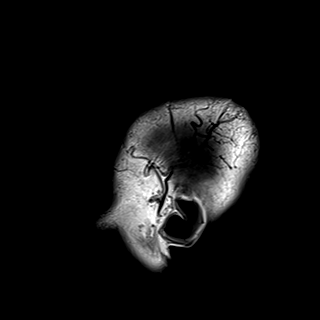

[Series 21: T2 · coronal · 5.0mm · 0.34mm/px · 3 of 29 slices shown (2 of 2)]
[im 1/29]
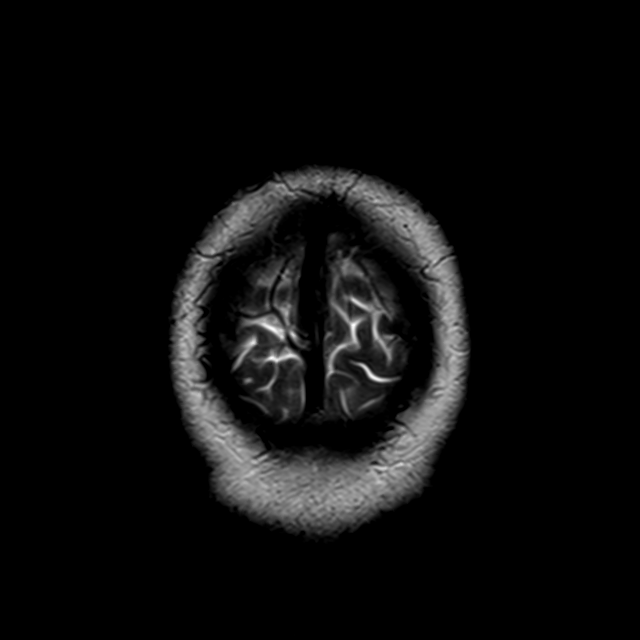
[im 15/29]
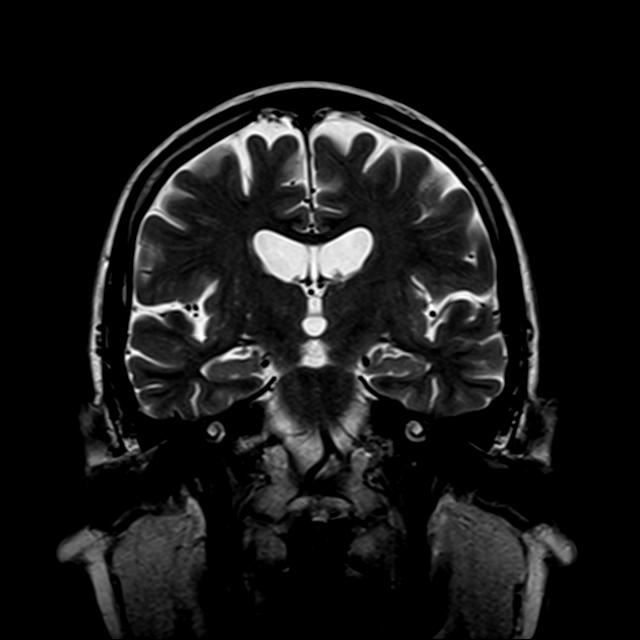
[im 29/29]
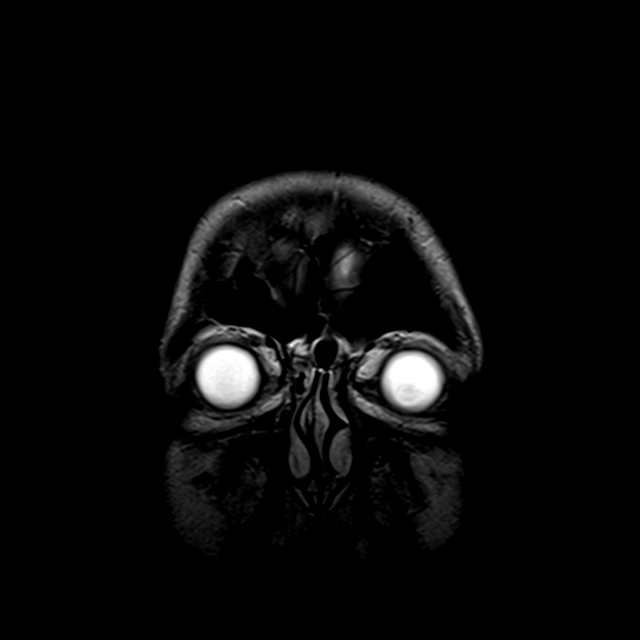

[43 of 48 positions shown; findings below may reference images not displayed]

FINDINGS: Brain: No acute infarction, hemorrhage, hydrocephalus, extra-axial
collection or mass lesion.

Vascular: Normal flow voids.

Skull and upper cervical spine: Normal marrow signal.

Sinuses/Orbits: Mild-to-moderate diffuse paranasal sinus mucosal
thickening greatest in the ethmoid air cells. No abnormal signal of
the mastoid air cells. Bilateral intra-ocular lens replacement.

Other: None.
IMPRESSION: 1. No acute intracranial abnormality identified. Unremarkable of the
brain.
2. Mild to moderate paranasal sinus disease greatest in the ethmoid
air cells.

## 2019-08-06 DIAGNOSIS — H43812 Vitreous degeneration, left eye: Secondary | ICD-10-CM | POA: Diagnosis not present

## 2019-08-28 DIAGNOSIS — H02839 Dermatochalasis of unspecified eye, unspecified eyelid: Secondary | ICD-10-CM | POA: Diagnosis not present

## 2019-08-28 DIAGNOSIS — H43812 Vitreous degeneration, left eye: Secondary | ICD-10-CM | POA: Diagnosis not present

## 2019-08-28 DIAGNOSIS — H35033 Hypertensive retinopathy, bilateral: Secondary | ICD-10-CM | POA: Diagnosis not present

## 2019-09-18 DIAGNOSIS — C61 Malignant neoplasm of prostate: Secondary | ICD-10-CM | POA: Diagnosis not present

## 2019-09-18 DIAGNOSIS — N393 Stress incontinence (female) (male): Secondary | ICD-10-CM | POA: Diagnosis not present

## 2019-09-18 DIAGNOSIS — N5201 Erectile dysfunction due to arterial insufficiency: Secondary | ICD-10-CM | POA: Diagnosis not present

## 2019-09-18 DIAGNOSIS — J3489 Other specified disorders of nose and nasal sinuses: Secondary | ICD-10-CM | POA: Diagnosis not present

## 2019-10-30 DIAGNOSIS — H57813 Brow ptosis, bilateral: Secondary | ICD-10-CM | POA: Diagnosis not present

## 2019-11-28 DIAGNOSIS — R69 Illness, unspecified: Secondary | ICD-10-CM | POA: Diagnosis not present

## 2019-12-11 DIAGNOSIS — R197 Diarrhea, unspecified: Secondary | ICD-10-CM | POA: Diagnosis not present

## 2019-12-11 DIAGNOSIS — R140 Abdominal distension (gaseous): Secondary | ICD-10-CM | POA: Diagnosis not present

## 2019-12-12 DIAGNOSIS — R140 Abdominal distension (gaseous): Secondary | ICD-10-CM | POA: Diagnosis not present

## 2020-01-07 DIAGNOSIS — Z1159 Encounter for screening for other viral diseases: Secondary | ICD-10-CM | POA: Diagnosis not present

## 2020-01-08 DIAGNOSIS — L570 Actinic keratosis: Secondary | ICD-10-CM | POA: Diagnosis not present

## 2020-01-08 DIAGNOSIS — C44319 Basal cell carcinoma of skin of other parts of face: Secondary | ICD-10-CM | POA: Diagnosis not present

## 2020-01-08 DIAGNOSIS — D1721 Benign lipomatous neoplasm of skin and subcutaneous tissue of right arm: Secondary | ICD-10-CM | POA: Diagnosis not present

## 2020-01-08 DIAGNOSIS — L821 Other seborrheic keratosis: Secondary | ICD-10-CM | POA: Diagnosis not present

## 2020-01-08 DIAGNOSIS — D485 Neoplasm of uncertain behavior of skin: Secondary | ICD-10-CM | POA: Diagnosis not present

## 2020-01-09 DIAGNOSIS — K635 Polyp of colon: Secondary | ICD-10-CM | POA: Diagnosis not present

## 2020-01-09 DIAGNOSIS — K5730 Diverticulosis of large intestine without perforation or abscess without bleeding: Secondary | ICD-10-CM | POA: Diagnosis not present

## 2020-01-09 DIAGNOSIS — Z86010 Personal history of colonic polyps: Secondary | ICD-10-CM | POA: Diagnosis not present

## 2020-01-09 DIAGNOSIS — D120 Benign neoplasm of cecum: Secondary | ICD-10-CM | POA: Diagnosis not present

## 2020-01-09 DIAGNOSIS — Z800 Family history of malignant neoplasm of digestive organs: Secondary | ICD-10-CM | POA: Diagnosis not present

## 2020-01-09 DIAGNOSIS — D124 Benign neoplasm of descending colon: Secondary | ICD-10-CM | POA: Diagnosis not present

## 2020-01-09 DIAGNOSIS — D123 Benign neoplasm of transverse colon: Secondary | ICD-10-CM | POA: Diagnosis not present

## 2020-01-09 DIAGNOSIS — K621 Rectal polyp: Secondary | ICD-10-CM | POA: Diagnosis not present

## 2020-01-14 DIAGNOSIS — D123 Benign neoplasm of transverse colon: Secondary | ICD-10-CM | POA: Diagnosis not present

## 2020-01-14 DIAGNOSIS — K621 Rectal polyp: Secondary | ICD-10-CM | POA: Diagnosis not present

## 2020-01-14 DIAGNOSIS — K635 Polyp of colon: Secondary | ICD-10-CM | POA: Diagnosis not present

## 2020-01-14 DIAGNOSIS — D120 Benign neoplasm of cecum: Secondary | ICD-10-CM | POA: Diagnosis not present

## 2020-01-14 DIAGNOSIS — D124 Benign neoplasm of descending colon: Secondary | ICD-10-CM | POA: Diagnosis not present

## 2020-01-29 DIAGNOSIS — A932 Colorado tick fever: Secondary | ICD-10-CM | POA: Diagnosis not present

## 2020-02-05 DIAGNOSIS — R69 Illness, unspecified: Secondary | ICD-10-CM | POA: Diagnosis not present

## 2020-02-20 DIAGNOSIS — R69 Illness, unspecified: Secondary | ICD-10-CM | POA: Diagnosis not present

## 2020-03-18 DIAGNOSIS — Z0000 Encounter for general adult medical examination without abnormal findings: Secondary | ICD-10-CM | POA: Diagnosis not present

## 2020-03-18 DIAGNOSIS — I10 Essential (primary) hypertension: Secondary | ICD-10-CM | POA: Diagnosis not present

## 2020-03-18 DIAGNOSIS — E7800 Pure hypercholesterolemia, unspecified: Secondary | ICD-10-CM | POA: Diagnosis not present

## 2020-03-18 DIAGNOSIS — E039 Hypothyroidism, unspecified: Secondary | ICD-10-CM | POA: Diagnosis not present

## 2020-03-18 DIAGNOSIS — Z8546 Personal history of malignant neoplasm of prostate: Secondary | ICD-10-CM | POA: Diagnosis not present

## 2020-03-18 DIAGNOSIS — G459 Transient cerebral ischemic attack, unspecified: Secondary | ICD-10-CM | POA: Diagnosis not present

## 2020-04-02 DIAGNOSIS — J019 Acute sinusitis, unspecified: Secondary | ICD-10-CM | POA: Diagnosis not present

## 2020-04-02 DIAGNOSIS — Z20828 Contact with and (suspected) exposure to other viral communicable diseases: Secondary | ICD-10-CM | POA: Diagnosis not present

## 2020-04-02 DIAGNOSIS — J069 Acute upper respiratory infection, unspecified: Secondary | ICD-10-CM | POA: Diagnosis not present

## 2020-05-05 DIAGNOSIS — M545 Low back pain: Secondary | ICD-10-CM | POA: Diagnosis not present

## 2020-05-05 DIAGNOSIS — Z041 Encounter for examination and observation following transport accident: Secondary | ICD-10-CM | POA: Diagnosis not present

## 2020-05-17 DIAGNOSIS — R0981 Nasal congestion: Secondary | ICD-10-CM | POA: Diagnosis not present

## 2020-05-17 DIAGNOSIS — M546 Pain in thoracic spine: Secondary | ICD-10-CM | POA: Diagnosis not present

## 2020-05-21 DIAGNOSIS — R141 Gas pain: Secondary | ICD-10-CM | POA: Diagnosis not present

## 2020-05-21 DIAGNOSIS — K649 Unspecified hemorrhoids: Secondary | ICD-10-CM | POA: Diagnosis not present

## 2020-07-01 DIAGNOSIS — D485 Neoplasm of uncertain behavior of skin: Secondary | ICD-10-CM | POA: Diagnosis not present

## 2020-07-01 DIAGNOSIS — C44619 Basal cell carcinoma of skin of left upper limb, including shoulder: Secondary | ICD-10-CM | POA: Diagnosis not present

## 2020-07-01 DIAGNOSIS — Z85828 Personal history of other malignant neoplasm of skin: Secondary | ICD-10-CM | POA: Diagnosis not present

## 2020-07-01 DIAGNOSIS — C44321 Squamous cell carcinoma of skin of nose: Secondary | ICD-10-CM | POA: Diagnosis not present

## 2020-07-01 DIAGNOSIS — L820 Inflamed seborrheic keratosis: Secondary | ICD-10-CM | POA: Diagnosis not present

## 2020-07-15 DIAGNOSIS — M545 Low back pain, unspecified: Secondary | ICD-10-CM | POA: Diagnosis not present

## 2020-07-15 DIAGNOSIS — Z23 Encounter for immunization: Secondary | ICD-10-CM | POA: Diagnosis not present

## 2020-07-26 DIAGNOSIS — S39012A Strain of muscle, fascia and tendon of lower back, initial encounter: Secondary | ICD-10-CM | POA: Diagnosis not present

## 2020-07-28 DIAGNOSIS — C44321 Squamous cell carcinoma of skin of nose: Secondary | ICD-10-CM | POA: Diagnosis not present

## 2020-07-28 DIAGNOSIS — Z85828 Personal history of other malignant neoplasm of skin: Secondary | ICD-10-CM | POA: Diagnosis not present

## 2020-08-05 DIAGNOSIS — L570 Actinic keratosis: Secondary | ICD-10-CM | POA: Diagnosis not present

## 2020-08-28 DIAGNOSIS — G459 Transient cerebral ischemic attack, unspecified: Secondary | ICD-10-CM

## 2020-08-28 HISTORY — DX: Transient cerebral ischemic attack, unspecified: G45.9

## 2020-09-16 DIAGNOSIS — I10 Essential (primary) hypertension: Secondary | ICD-10-CM | POA: Diagnosis not present

## 2020-09-16 DIAGNOSIS — E7800 Pure hypercholesterolemia, unspecified: Secondary | ICD-10-CM | POA: Diagnosis not present

## 2020-09-16 DIAGNOSIS — G459 Transient cerebral ischemic attack, unspecified: Secondary | ICD-10-CM | POA: Diagnosis not present

## 2020-10-07 DIAGNOSIS — C61 Malignant neoplasm of prostate: Secondary | ICD-10-CM | POA: Diagnosis not present

## 2020-10-14 DIAGNOSIS — C61 Malignant neoplasm of prostate: Secondary | ICD-10-CM | POA: Diagnosis not present

## 2020-10-14 DIAGNOSIS — N393 Stress incontinence (female) (male): Secondary | ICD-10-CM | POA: Diagnosis not present

## 2020-11-22 ENCOUNTER — Telehealth: Payer: Self-pay | Admitting: Neurology

## 2020-11-22 NOTE — Telephone Encounter (Signed)
Pt requested appointment because of pain in his head that he mentioned to Dr Jannifer Franklin is 08-2018.  Pt told this would be relayed to RN and if there are questions of concerns RN will call.  Pt okayed and this is FYI for Judson Roch, NP and [employer]

## 2020-11-22 NOTE — Telephone Encounter (Signed)
Please see [name] note regarding pain back in Jan 2020, I think this may be what the patient is referring to, make need to initiate medication....  1.  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  The patient gets tearing of the left eye with sharp jabs of pain consistent with the SUNCT headache syndrome.  The patient also has episodes of hypersensitivity to light touch on the left mid and upper face consistent with a SUNCT headache.  The episodes will come and go, the patient currently is not having any pain.  The pain will likely recur at some point in the future and if prolonged, he may need to go on medications.  The patient may be treated with Topamax and Lamictal in the future for the headache.  The patient will follow-up if needed.  MRI of the brain will be done with and without gadolinium enhancement.  Blood work will be done today.

## 2020-11-22 NOTE — Telephone Encounter (Signed)
Looks like some mention to [place] back in 08-2018.

## 2020-11-24 ENCOUNTER — Encounter: Payer: Self-pay | Admitting: Neurology

## 2020-11-24 ENCOUNTER — Ambulatory Visit (INDEPENDENT_AMBULATORY_CARE_PROVIDER_SITE_OTHER): Payer: Medicare HMO | Admitting: Neurology

## 2020-11-24 VITALS — BP 150/90 | HR 70 | Ht 76.0 in | Wt 239.0 lb

## 2020-11-24 DIAGNOSIS — G44059 Short lasting unilateral neuralgiform headache with conjunctival injection and tearing (SUNCT), not intractable: Secondary | ICD-10-CM | POA: Diagnosis not present

## 2020-11-24 NOTE — Progress Notes (Signed)
I have read the note, and I agree with the clinical assessment and plan.  Lindzy Rupert K Tayra Dawe   

## 2020-11-24 NOTE — Progress Notes (Signed)
PATIENT: Robert Clarke DOB: June 06, 1941  REASON FOR VISIT: follow up HISTORY FROM: patient  HISTORY OF PRESENT ILLNESS: Today 11/24/20  Robert Clarke is a 80 year old male with history of headaches on the left side for 25 years, felt to have a probable SUNCT type headache.  MRI of the brain was done in May 2020, was unremarkable. For last 2 months, pain to left V1, V2 area, can't tell if pain is under left jaw area if moves his tongue. If he touches his left eyebrow or forehead, sends sharp pains to top of head. Or wind blowing against it. Moving tongue is sore on left side, did get dentures 6 months ago, feels swollen under the tongue. The pain is very bad with touch. Left eye is droopy from skin cancer skin graft, can't have lid lift. Has allergies, so hard to say if nasal or eye drainage. In last 30 years, usually symptoms are a few days, then go away for several months, usually brought on by chewing. This time difference is longer duration. No changes to vision, no numbness or weakness to arms of legs, no changes to gait.  Here today for evaluation unaccompanied.  Update 02/27/2019 SS: Robert Clarke is a 80 year old male, last seen in our office in January 2020 by [name] for history of headaches on the left side for 25 years.  He was thought to have probable SUNCT type headache.  He had MRI of the brain in January 2020, was normal.  Today, he is here for follow-up from a new problem.  He went to the ER Jan 16, 2019 after noticing sudden onset difficulty reading with word finding difficulty at 1300.  A code stroke was activated.  NIH was 0.  He continued to have difficulty with his words, but felt it was improving.  He did not meet criteria for acute stroke treatment, he did not have focal deficits on exam.  He was admitted for TIA work-up.  While in the hospital, he had a CT head, MRI of the brain, CTA head and neck, all were unremarkable. His symptoms were concerning for cortical TIA. Dr. Erlinda Hong on  discharge recommended 30-day cardiac monitoring to rule out A. Fib.  The event occurred in the setting of taking daily 81 mg aspirin, was discharged home on aspirin 325 mg daily.  He is very sensitive to antiplatelet, with bleeding.  It was decided to only increase aspirin versus combination of aspirin and Plavix.  Prior to hospital admission, he was not on any lipid-lowering medication, LDL was 130, goal <70.  He was started on Zetia. While in the hospital EKG showed sinus rhythm, 60 bpm.  Echocardiogram was done, did not show any evidence of cardioembolic source (I have attached the imaging reports below)  A1c 5.4, LDL 130  Today, he reports he has been doing well, has not had any recurrence of blood finding difficulty.  He described that the event that occurred in May, occurred after he had been sitting watching TV, he fell asleep.  His wife asked him to get a bag of shrimp.  When he was reading the shrimp bag, he felt like he could not read the words, couldn't get his words out.  This lasted for 1 to 2 minutes.  He says he panicked, got scared.  He denies any slurred speech.  He did not have a headache at the time of the event. He did not have any other symptoms.  He says at the time, he  was under stress, had been laid off, also had just been woken up by his wife.  He wonders if these factors could have contributed to the episode.  When he arrived to the hospital, his symptoms have resolved. He is scheduled to see his primary care doctor next week.  He has not started the Zetia.  He checks his blood pressure at home, is usually around 140/80.  He presents today for follow-up unaccompanied.  HISTORY  09/12/2018 [name]: Robert Clarke is a 80 year old right-handed white male with a history of headaches on the left side of his face that date back about 25 years.  The patient indicates that the episodes may come on and last for 2 to 3 days and then disappear for months only to recur.  The patient has not gone  a full year without any pain.  The patient had a more prolonged event that began in November 2019, again associated with sharp jabs of pain that would go from the mid left face through the eye and into the top of the head on the left.  The episodes will be initiated by chewing.  The patient began having a new issue which involved sensitivity to light touch around the left forehead and around the left eye.  The pain was associated with a dull constant achy sensation.  Within the last 2 weeks, the pain is disappeared.  The patient reports no numbness or weakness of the face, arms, legs.  He has no visual changes or double vision or changes in speech or swallowing.  More recently in the last several days he did have one event of a round dark area in the left visual field that lasted about a half an hour and then disappeared.  This has never happened before.  The patient denies any significant balance problems or difficulty controlling the bowels or the bladder.  He does have some sinus drainage at times.  He claims that with that sharp jabs of pain he will have tearing of the left eye only.  He comes to this office for an evaluation.  REVIEW OF SYSTEMS: Out of a complete 14 system review of symptoms, the patient complains only of the following symptoms, and all other reviewed systems are negative.  Head pain  ALLERGIES: Allergies  Allergen Reactions  . Iodine Rash  . Iodine-131 Rash  . Ivp Dye [Iodinated Diagnostic Agents] Itching and Rash    HOME MEDICATIONS: Outpatient Medications Prior to Visit  Medication Sig Dispense Refill  . ASPIRIN 81 PO Take 81 mg by mouth daily.    . fluticasone (FLONASE) 50 MCG/ACT nasal spray Place 1 spray into both nostrils daily.     Marland Kitchen levothyroxine (SYNTHROID, LEVOTHROID) 137 MCG tablet Take 137 mcg by mouth daily.     Marland Kitchen omeprazole (PRILOSEC) 40 MG capsule Take 40 mg by mouth daily.     Marland Kitchen telmisartan (MICARDIS) 80 MG tablet Take 80 mg by mouth daily.     No  facility-administered medications prior to visit.    PAST MEDICAL HISTORY: Past Medical History:  Diagnosis Date  . Acid reflux   . Cancer [facility])    prostate  . Hypertension   . 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 Thyroid disease     PAST SURGICAL HISTORY: Past Surgical History:  Procedure Laterality Date  . COLONOSCOPY WITH PROPOFOL N/A 04/13/2015   Procedure: COLONOSCOPY WITH PROPOFOL;  Surgeon: Garlan Fair, MD;  Location: [facility] ENDOSCOPY;  Service: Endoscopy;  Laterality: N/A;  . PROSTATECTOMY  2001  . THYROID SURGERY      FAMILY HISTORY: Family History  Problem Relation Age of Onset  . Cancer - Colon Father     SOCIAL HISTORY: Social History   Socioeconomic History  . Marital status: Married    Spouse name: Not on file  . Number of children: Not on file  . Years of education: Not on file  . Highest education level: Some college, no degree  Occupational History  . Occupation: [employer]   Tobacco Use  . Smoking status: Never Smoker  . Smokeless tobacco: Never Used  Vaping Use  . Vaping Use: Never used  Substance and Sexual Activity  . Alcohol use: Yes  . Drug use: No  . Sexual activity: Not on file  Other Topics Concern  . Not on file  Social History Narrative   Right handed   Caffeine 5 cups daily    Lives at home with wife    Social Determinants of Health   Financial Resource Strain: Not on file  Food Insecurity: Not on file  Transportation Needs: Not on file  Physical Activity: Not on file  Stress: Not on file  Social Connections: Not on file  Intimate Partner Violence: Not on file   PHYSICAL EXAM  Vitals:   11/24/20 1004 11/24/20 1044  BP: (!) 175/91 (!) 150/90  Pulse: 70   Weight: 239 lb (108.4 kg)   Height: 6\' 4"  (1.93 m)    Body mass index is 29.09 kg/m.  Generalized: Well developed, in no acute distress   Neurological examination  Mentation: Alert oriented to time, place, history  taking. Follows all commands speech and language fluent Cranial nerve II-XII: Pupils were equal round reactive to light. Extraocular movements were full, visual field were full on confrontational test. Facial sensation and strength were normal. Head turning and shoulder shrug  were normal and symmetric. Left V1 sensitive to touch, mostly above eyebrow Motor: The motor testing reveals 5 over 5 strength of all 4 extremities. Good symmetric motor tone is noted throughout.  Sensory: Sensory testing is intact to soft touch on all 4 extremities. No evidence of extinction is noted.  Coordination: Cerebellar testing reveals good finger-nose-finger and heel-to-shin bilaterally.  Gait and station: Gait is normal. Tandem gait is mildly unsteady.  Reflexes: Deep tendon reflexes are symmetric and normal bilaterally.   DIAGNOSTIC DATA (LABS, IMAGING, TESTING) - I reviewed patient records, labs, notes, testing and imaging myself where available.  Lab Results  Component Value Date   WBC 8.8 01/16/2019   HGB 17.3 (H) 01/16/2019   HCT 51.0 01/16/2019   MCV 90.3 01/16/2019   PLT 208 01/16/2019      Component Value Date/Time   NA 134 (L) 01/16/2019 1400   NA 137 09/12/2018 1014   K 4.4 01/16/2019 1400   CL 100 01/16/2019 1400   CO2 26 01/16/2019 1358   GLUCOSE 87 01/16/2019 1400   BUN 15 01/16/2019 1400   BUN 12 09/12/2018 1014   CREATININE 1.00 01/16/2019 1400   CALCIUM 9.4 01/16/2019 1358   PROT 7.0 01/16/2019 1358   PROT 6.7 09/12/2018 1014   ALBUMIN 4.1 01/16/2019 1358   ALBUMIN 4.3 09/12/2018 1014   AST 24 01/16/2019 1358   ALT 17 01/16/2019 1358   ALKPHOS 87 01/16/2019 1358   BILITOT 1.3 (H) 01/16/2019 1358   BILITOT 0.8 09/12/2018 1014   GFRNONAA >60 01/16/2019 1358   GFRAA >60 01/16/2019 1358   Lab Results  Component Value Date   CHOL 195 01/17/2019   HDL 49 01/17/2019   LDLCALC 130 (H) 01/17/2019   TRIG 78 01/17/2019   CHOLHDL 4.0 01/17/2019   Lab Results  Component Value  Date   HGBA1C 5.4 01/17/2019   No results found for: VITAMINB12 No results found for: TSH  ASSESSMENT AND PLAN 80 y.o. year old male  has a past medical history of Acid reflux, Cancer ([place]), Hypertension, SUNCT (short unilateral neuralgiform headache, conjunctival inj/tear) (09/12/2018), and Thyroid disease. here with:  1.  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, left V1 and V2 distributions  -30-year history, however this episode has been going on for 2 months, usually last several days -Same presentation, hypersensitivity left V1 and V2 distribution to touch, sends sharp pains up the head -Not sure the sensation under the tongue is related (feels swollen, not sharp pains), new dentures last 6 months -Not interested in medication at this point, would recommend starting Lamictal if pain does not improve, start 25 mg daily x 2 weeks, then 50 mg daily, or try Topamax -He wishes to see if his symptoms improve, He will contact me if they do not -Sed rate normal in Jan 2020, was 20 initial eval with [name] -BP up some today, 150/90, check at home, f/u with PCP -Follow-up in 6 months or sooner if needed with [name] or an NP, just to make sure pain is under good control, not having issue  I spent 30 minutes of face-to-face and non-face-to-face time with patient.  This included previsit chart review, lab review, study review, order entry, electronic health record documentation, patient education.  Robert Clarke, AGNP-C, DNP 11/24/2020, 10:44 AM [facility] [address] [place], [place] [address] [phone]

## 2020-11-24 NOTE — Patient Instructions (Addendum)
If symptoms do not improve, contact me, will initiate medication therapy Follow-up in 6 months or sooner if needed

## 2020-12-22 DIAGNOSIS — Z800 Family history of malignant neoplasm of digestive organs: Secondary | ICD-10-CM | POA: Diagnosis not present

## 2020-12-22 DIAGNOSIS — K648 Other hemorrhoids: Secondary | ICD-10-CM | POA: Diagnosis not present

## 2020-12-22 DIAGNOSIS — Z86010 Personal history of colonic polyps: Secondary | ICD-10-CM | POA: Diagnosis not present

## 2020-12-22 DIAGNOSIS — R140 Abdominal distension (gaseous): Secondary | ICD-10-CM | POA: Diagnosis not present

## 2021-01-12 DIAGNOSIS — K648 Other hemorrhoids: Secondary | ICD-10-CM | POA: Diagnosis not present

## 2021-01-12 DIAGNOSIS — D0439 Carcinoma in situ of skin of other parts of face: Secondary | ICD-10-CM | POA: Diagnosis not present

## 2021-01-13 DIAGNOSIS — D485 Neoplasm of uncertain behavior of skin: Secondary | ICD-10-CM | POA: Diagnosis not present

## 2021-01-13 DIAGNOSIS — L821 Other seborrheic keratosis: Secondary | ICD-10-CM | POA: Diagnosis not present

## 2021-01-13 DIAGNOSIS — D0439 Carcinoma in situ of skin of other parts of face: Secondary | ICD-10-CM | POA: Diagnosis not present

## 2021-01-13 DIAGNOSIS — L570 Actinic keratosis: Secondary | ICD-10-CM | POA: Diagnosis not present

## 2021-01-13 DIAGNOSIS — Z85828 Personal history of other malignant neoplasm of skin: Secondary | ICD-10-CM | POA: Diagnosis not present

## 2021-01-21 DIAGNOSIS — H5211 Myopia, right eye: Secondary | ICD-10-CM | POA: Diagnosis not present

## 2021-01-21 DIAGNOSIS — Z0100 Encounter for examination of eyes and vision without abnormal findings: Secondary | ICD-10-CM | POA: Diagnosis not present

## 2021-01-21 DIAGNOSIS — H52223 Regular astigmatism, bilateral: Secondary | ICD-10-CM | POA: Diagnosis not present

## 2021-01-21 DIAGNOSIS — H5202 Hypermetropia, left eye: Secondary | ICD-10-CM | POA: Diagnosis not present

## 2021-01-21 DIAGNOSIS — H43812 Vitreous degeneration, left eye: Secondary | ICD-10-CM | POA: Diagnosis not present

## 2021-01-21 DIAGNOSIS — H35372 Puckering of macula, left eye: Secondary | ICD-10-CM | POA: Diagnosis not present

## 2021-01-21 DIAGNOSIS — Z961 Presence of intraocular lens: Secondary | ICD-10-CM | POA: Diagnosis not present

## 2021-01-21 DIAGNOSIS — H02839 Dermatochalasis of unspecified eye, unspecified eyelid: Secondary | ICD-10-CM | POA: Diagnosis not present

## 2021-02-02 DIAGNOSIS — K648 Other hemorrhoids: Secondary | ICD-10-CM | POA: Diagnosis not present

## 2021-02-10 DIAGNOSIS — C44329 Squamous cell carcinoma of skin of other parts of face: Secondary | ICD-10-CM | POA: Diagnosis not present

## 2021-02-23 DIAGNOSIS — K648 Other hemorrhoids: Secondary | ICD-10-CM | POA: Diagnosis not present

## 2021-03-23 DIAGNOSIS — K623 Rectal prolapse: Secondary | ICD-10-CM | POA: Diagnosis not present

## 2021-03-23 DIAGNOSIS — K602 Anal fissure, unspecified: Secondary | ICD-10-CM | POA: Diagnosis not present

## 2021-04-08 DIAGNOSIS — G500 Trigeminal neuralgia: Secondary | ICD-10-CM | POA: Diagnosis not present

## 2021-04-15 DIAGNOSIS — S29011A Strain of muscle and tendon of front wall of thorax, initial encounter: Secondary | ICD-10-CM | POA: Diagnosis not present

## 2021-04-21 DIAGNOSIS — L570 Actinic keratosis: Secondary | ICD-10-CM | POA: Diagnosis not present

## 2021-04-21 DIAGNOSIS — C4442 Squamous cell carcinoma of skin of scalp and neck: Secondary | ICD-10-CM | POA: Diagnosis not present

## 2021-04-21 DIAGNOSIS — C4441 Basal cell carcinoma of skin of scalp and neck: Secondary | ICD-10-CM | POA: Diagnosis not present

## 2021-04-21 DIAGNOSIS — D485 Neoplasm of uncertain behavior of skin: Secondary | ICD-10-CM | POA: Diagnosis not present

## 2021-04-21 DIAGNOSIS — C44329 Squamous cell carcinoma of skin of other parts of face: Secondary | ICD-10-CM | POA: Diagnosis not present

## 2021-04-21 DIAGNOSIS — C44319 Basal cell carcinoma of skin of other parts of face: Secondary | ICD-10-CM | POA: Diagnosis not present

## 2021-04-27 DIAGNOSIS — K625 Hemorrhage of anus and rectum: Secondary | ICD-10-CM | POA: Diagnosis not present

## 2021-04-27 DIAGNOSIS — R151 Fecal smearing: Secondary | ICD-10-CM | POA: Diagnosis not present

## 2021-04-28 ENCOUNTER — Ambulatory Visit: Payer: Medicare HMO | Admitting: Neurology

## 2021-06-02 DIAGNOSIS — Z0000 Encounter for general adult medical examination without abnormal findings: Secondary | ICD-10-CM | POA: Diagnosis not present

## 2021-06-02 DIAGNOSIS — E7800 Pure hypercholesterolemia, unspecified: Secondary | ICD-10-CM | POA: Diagnosis not present

## 2021-06-02 DIAGNOSIS — R7309 Other abnormal glucose: Secondary | ICD-10-CM | POA: Diagnosis not present

## 2021-06-02 DIAGNOSIS — I10 Essential (primary) hypertension: Secondary | ICD-10-CM | POA: Diagnosis not present

## 2021-06-02 DIAGNOSIS — E039 Hypothyroidism, unspecified: Secondary | ICD-10-CM | POA: Diagnosis not present

## 2021-06-09 DIAGNOSIS — R519 Headache, unspecified: Secondary | ICD-10-CM | POA: Diagnosis not present

## 2021-06-09 DIAGNOSIS — M792 Neuralgia and neuritis, unspecified: Secondary | ICD-10-CM | POA: Diagnosis not present

## 2021-06-09 DIAGNOSIS — G500 Trigeminal neuralgia: Secondary | ICD-10-CM | POA: Diagnosis not present

## 2021-06-16 DIAGNOSIS — C44319 Basal cell carcinoma of skin of other parts of face: Secondary | ICD-10-CM | POA: Diagnosis not present

## 2021-06-30 DIAGNOSIS — Z23 Encounter for immunization: Secondary | ICD-10-CM | POA: Diagnosis not present

## 2021-07-27 DIAGNOSIS — Z86010 Personal history of colonic polyps: Secondary | ICD-10-CM | POA: Diagnosis not present

## 2021-07-27 DIAGNOSIS — K625 Hemorrhage of anus and rectum: Secondary | ICD-10-CM | POA: Diagnosis not present

## 2021-07-27 DIAGNOSIS — K648 Other hemorrhoids: Secondary | ICD-10-CM | POA: Diagnosis not present

## 2021-07-27 DIAGNOSIS — K5730 Diverticulosis of large intestine without perforation or abscess without bleeding: Secondary | ICD-10-CM | POA: Diagnosis not present

## 2021-07-27 DIAGNOSIS — K635 Polyp of colon: Secondary | ICD-10-CM | POA: Diagnosis not present

## 2021-07-27 DIAGNOSIS — D122 Benign neoplasm of ascending colon: Secondary | ICD-10-CM | POA: Diagnosis not present

## 2021-07-27 DIAGNOSIS — Z1211 Encounter for screening for malignant neoplasm of colon: Secondary | ICD-10-CM | POA: Diagnosis not present

## 2021-07-27 DIAGNOSIS — D123 Benign neoplasm of transverse colon: Secondary | ICD-10-CM | POA: Diagnosis not present

## 2021-07-27 DIAGNOSIS — D124 Benign neoplasm of descending colon: Secondary | ICD-10-CM | POA: Diagnosis not present

## 2021-07-27 DIAGNOSIS — D125 Benign neoplasm of sigmoid colon: Secondary | ICD-10-CM | POA: Diagnosis not present

## 2021-07-29 DIAGNOSIS — I10 Essential (primary) hypertension: Secondary | ICD-10-CM | POA: Diagnosis not present

## 2021-08-12 DIAGNOSIS — Z7982 Long term (current) use of aspirin: Secondary | ICD-10-CM | POA: Diagnosis not present

## 2021-08-12 DIAGNOSIS — G500 Trigeminal neuralgia: Secondary | ICD-10-CM | POA: Diagnosis not present

## 2021-08-12 DIAGNOSIS — I10 Essential (primary) hypertension: Secondary | ICD-10-CM | POA: Diagnosis not present

## 2021-08-12 DIAGNOSIS — K219 Gastro-esophageal reflux disease without esophagitis: Secondary | ICD-10-CM | POA: Diagnosis not present

## 2021-08-12 DIAGNOSIS — E890 Postprocedural hypothyroidism: Secondary | ICD-10-CM | POA: Diagnosis not present

## 2021-08-12 DIAGNOSIS — Z800 Family history of malignant neoplasm of digestive organs: Secondary | ICD-10-CM | POA: Diagnosis not present

## 2021-08-12 DIAGNOSIS — Z8546 Personal history of malignant neoplasm of prostate: Secondary | ICD-10-CM | POA: Diagnosis not present

## 2021-08-12 DIAGNOSIS — Z85828 Personal history of other malignant neoplasm of skin: Secondary | ICD-10-CM | POA: Diagnosis not present

## 2021-08-12 DIAGNOSIS — J309 Allergic rhinitis, unspecified: Secondary | ICD-10-CM | POA: Diagnosis not present

## 2021-08-12 DIAGNOSIS — I739 Peripheral vascular disease, unspecified: Secondary | ICD-10-CM | POA: Diagnosis not present

## 2021-08-12 DIAGNOSIS — Z8249 Family history of ischemic heart disease and other diseases of the circulatory system: Secondary | ICD-10-CM | POA: Diagnosis not present

## 2021-08-12 DIAGNOSIS — Z825 Family history of asthma and other chronic lower respiratory diseases: Secondary | ICD-10-CM | POA: Diagnosis not present

## 2021-08-12 DIAGNOSIS — Z008 Encounter for other general examination: Secondary | ICD-10-CM | POA: Diagnosis not present

## 2021-08-12 DIAGNOSIS — E785 Hyperlipidemia, unspecified: Secondary | ICD-10-CM | POA: Diagnosis not present

## 2021-09-09 DIAGNOSIS — B9689 Other specified bacterial agents as the cause of diseases classified elsewhere: Secondary | ICD-10-CM | POA: Diagnosis not present

## 2021-09-09 DIAGNOSIS — J019 Acute sinusitis, unspecified: Secondary | ICD-10-CM | POA: Diagnosis not present

## 2021-09-09 DIAGNOSIS — Z03818 Encounter for observation for suspected exposure to other biological agents ruled out: Secondary | ICD-10-CM | POA: Diagnosis not present

## 2021-09-25 DIAGNOSIS — J0100 Acute maxillary sinusitis, unspecified: Secondary | ICD-10-CM | POA: Diagnosis not present

## 2021-11-18 DIAGNOSIS — J309 Allergic rhinitis, unspecified: Secondary | ICD-10-CM | POA: Diagnosis not present

## 2021-11-18 DIAGNOSIS — J019 Acute sinusitis, unspecified: Secondary | ICD-10-CM | POA: Diagnosis not present

## 2021-12-08 DIAGNOSIS — D0461 Carcinoma in situ of skin of right upper limb, including shoulder: Secondary | ICD-10-CM | POA: Diagnosis not present

## 2021-12-08 DIAGNOSIS — D485 Neoplasm of uncertain behavior of skin: Secondary | ICD-10-CM | POA: Diagnosis not present

## 2021-12-08 DIAGNOSIS — L570 Actinic keratosis: Secondary | ICD-10-CM | POA: Diagnosis not present

## 2021-12-08 DIAGNOSIS — L821 Other seborrheic keratosis: Secondary | ICD-10-CM | POA: Diagnosis not present

## 2021-12-09 DIAGNOSIS — K640 First degree hemorrhoids: Secondary | ICD-10-CM | POA: Diagnosis not present

## 2021-12-09 DIAGNOSIS — M6289 Other specified disorders of muscle: Secondary | ICD-10-CM | POA: Diagnosis not present

## 2021-12-09 DIAGNOSIS — K648 Other hemorrhoids: Secondary | ICD-10-CM | POA: Diagnosis not present

## 2021-12-23 DIAGNOSIS — G500 Trigeminal neuralgia: Secondary | ICD-10-CM | POA: Diagnosis not present

## 2021-12-29 DIAGNOSIS — C61 Malignant neoplasm of prostate: Secondary | ICD-10-CM | POA: Diagnosis not present

## 2022-01-05 DIAGNOSIS — N393 Stress incontinence (female) (male): Secondary | ICD-10-CM | POA: Diagnosis not present

## 2022-01-05 DIAGNOSIS — C61 Malignant neoplasm of prostate: Secondary | ICD-10-CM | POA: Diagnosis not present

## 2022-01-05 DIAGNOSIS — N5201 Erectile dysfunction due to arterial insufficiency: Secondary | ICD-10-CM | POA: Diagnosis not present

## 2022-01-20 DIAGNOSIS — Z961 Presence of intraocular lens: Secondary | ICD-10-CM | POA: Diagnosis not present

## 2022-01-20 DIAGNOSIS — H35372 Puckering of macula, left eye: Secondary | ICD-10-CM | POA: Diagnosis not present

## 2022-01-20 DIAGNOSIS — H5202 Hypermetropia, left eye: Secondary | ICD-10-CM | POA: Diagnosis not present

## 2022-01-20 DIAGNOSIS — H02839 Dermatochalasis of unspecified eye, unspecified eyelid: Secondary | ICD-10-CM | POA: Diagnosis not present

## 2022-01-20 DIAGNOSIS — H52223 Regular astigmatism, bilateral: Secondary | ICD-10-CM | POA: Diagnosis not present

## 2022-01-20 DIAGNOSIS — Z0100 Encounter for examination of eyes and vision without abnormal findings: Secondary | ICD-10-CM | POA: Diagnosis not present

## 2022-01-20 DIAGNOSIS — H43812 Vitreous degeneration, left eye: Secondary | ICD-10-CM | POA: Diagnosis not present

## 2022-01-27 DIAGNOSIS — G720 Drug-induced myopathy: Secondary | ICD-10-CM | POA: Diagnosis not present

## 2022-01-27 DIAGNOSIS — Z8546 Personal history of malignant neoplasm of prostate: Secondary | ICD-10-CM | POA: Diagnosis not present

## 2022-01-27 DIAGNOSIS — I10 Essential (primary) hypertension: Secondary | ICD-10-CM | POA: Diagnosis not present

## 2022-01-27 DIAGNOSIS — E039 Hypothyroidism, unspecified: Secondary | ICD-10-CM | POA: Diagnosis not present

## 2022-01-27 DIAGNOSIS — Z8673 Personal history of transient ischemic attack (TIA), and cerebral infarction without residual deficits: Secondary | ICD-10-CM | POA: Diagnosis not present

## 2022-01-27 DIAGNOSIS — E7800 Pure hypercholesterolemia, unspecified: Secondary | ICD-10-CM | POA: Diagnosis not present

## 2022-02-23 DIAGNOSIS — C44319 Basal cell carcinoma of skin of other parts of face: Secondary | ICD-10-CM | POA: Diagnosis not present

## 2022-02-23 DIAGNOSIS — D485 Neoplasm of uncertain behavior of skin: Secondary | ICD-10-CM | POA: Diagnosis not present

## 2022-02-23 DIAGNOSIS — C44622 Squamous cell carcinoma of skin of right upper limb, including shoulder: Secondary | ICD-10-CM | POA: Diagnosis not present

## 2022-04-13 DIAGNOSIS — C44319 Basal cell carcinoma of skin of other parts of face: Secondary | ICD-10-CM | POA: Diagnosis not present

## 2022-04-20 DIAGNOSIS — D485 Neoplasm of uncertain behavior of skin: Secondary | ICD-10-CM | POA: Diagnosis not present

## 2022-04-20 DIAGNOSIS — C44321 Squamous cell carcinoma of skin of nose: Secondary | ICD-10-CM | POA: Diagnosis not present

## 2022-05-09 DIAGNOSIS — C44321 Squamous cell carcinoma of skin of nose: Secondary | ICD-10-CM | POA: Diagnosis not present

## 2022-08-03 DIAGNOSIS — Z1331 Encounter for screening for depression: Secondary | ICD-10-CM | POA: Diagnosis not present

## 2022-08-03 DIAGNOSIS — I10 Essential (primary) hypertension: Secondary | ICD-10-CM | POA: Diagnosis not present

## 2022-08-03 DIAGNOSIS — Z8546 Personal history of malignant neoplasm of prostate: Secondary | ICD-10-CM | POA: Diagnosis not present

## 2022-08-03 DIAGNOSIS — E039 Hypothyroidism, unspecified: Secondary | ICD-10-CM | POA: Diagnosis not present

## 2022-08-03 DIAGNOSIS — Z23 Encounter for immunization: Secondary | ICD-10-CM | POA: Diagnosis not present

## 2022-08-03 DIAGNOSIS — G720 Drug-induced myopathy: Secondary | ICD-10-CM | POA: Diagnosis not present

## 2022-08-03 DIAGNOSIS — Z0000 Encounter for general adult medical examination without abnormal findings: Secondary | ICD-10-CM | POA: Diagnosis not present

## 2022-08-03 DIAGNOSIS — E7800 Pure hypercholesterolemia, unspecified: Secondary | ICD-10-CM | POA: Diagnosis not present

## 2022-08-03 DIAGNOSIS — Z8673 Personal history of transient ischemic attack (TIA), and cerebral infarction without residual deficits: Secondary | ICD-10-CM | POA: Diagnosis not present

## 2022-08-10 DIAGNOSIS — E890 Postprocedural hypothyroidism: Secondary | ICD-10-CM | POA: Diagnosis not present

## 2022-08-10 DIAGNOSIS — Z809 Family history of malignant neoplasm, unspecified: Secondary | ICD-10-CM | POA: Diagnosis not present

## 2022-08-10 DIAGNOSIS — Z8249 Family history of ischemic heart disease and other diseases of the circulatory system: Secondary | ICD-10-CM | POA: Diagnosis not present

## 2022-08-10 DIAGNOSIS — Z888 Allergy status to other drugs, medicaments and biological substances status: Secondary | ICD-10-CM | POA: Diagnosis not present

## 2022-08-10 DIAGNOSIS — Z8546 Personal history of malignant neoplasm of prostate: Secondary | ICD-10-CM | POA: Diagnosis not present

## 2022-08-10 DIAGNOSIS — Z008 Encounter for other general examination: Secondary | ICD-10-CM | POA: Diagnosis not present

## 2022-08-10 DIAGNOSIS — K219 Gastro-esophageal reflux disease without esophagitis: Secondary | ICD-10-CM | POA: Diagnosis not present

## 2022-08-10 DIAGNOSIS — Z85828 Personal history of other malignant neoplasm of skin: Secondary | ICD-10-CM | POA: Diagnosis not present

## 2022-08-10 DIAGNOSIS — I10 Essential (primary) hypertension: Secondary | ICD-10-CM | POA: Diagnosis not present

## 2022-08-10 DIAGNOSIS — Z8673 Personal history of transient ischemic attack (TIA), and cerebral infarction without residual deficits: Secondary | ICD-10-CM | POA: Diagnosis not present

## 2022-08-14 DIAGNOSIS — G459 Transient cerebral ischemic attack, unspecified: Secondary | ICD-10-CM | POA: Diagnosis not present

## 2022-08-14 DIAGNOSIS — I10 Essential (primary) hypertension: Secondary | ICD-10-CM | POA: Diagnosis not present

## 2022-08-28 DIAGNOSIS — R69 Illness, unspecified: Secondary | ICD-10-CM | POA: Diagnosis not present

## 2022-08-31 DIAGNOSIS — R69 Illness, unspecified: Secondary | ICD-10-CM | POA: Diagnosis not present

## 2022-09-29 ENCOUNTER — Emergency Department (HOSPITAL_COMMUNITY)
Admission: EM | Admit: 2022-09-29 | Discharge: 2022-09-29 | Disposition: A | Discharge status: Home / Self Care | Payer: No Typology Code available for payment source | Attending: Emergency Medicine | Admitting: Emergency Medicine

## 2022-09-29 ENCOUNTER — Encounter (HOSPITAL_COMMUNITY): Payer: Self-pay

## 2022-09-29 ENCOUNTER — Emergency Department (HOSPITAL_COMMUNITY): Payer: No Typology Code available for payment source

## 2022-09-29 ENCOUNTER — Other Ambulatory Visit: Payer: Self-pay

## 2022-09-29 DIAGNOSIS — I10 Essential (primary) hypertension: Secondary | ICD-10-CM | POA: Insufficient documentation

## 2022-09-29 DIAGNOSIS — R1084 Generalized abdominal pain: Secondary | ICD-10-CM | POA: Insufficient documentation

## 2022-09-29 DIAGNOSIS — I998 Other disorder of circulatory system: Secondary | ICD-10-CM | POA: Insufficient documentation

## 2022-09-29 DIAGNOSIS — K529 Noninfective gastroenteritis and colitis, unspecified: Secondary | ICD-10-CM | POA: Diagnosis not present

## 2022-09-29 DIAGNOSIS — Z79899 Other long term (current) drug therapy: Secondary | ICD-10-CM | POA: Diagnosis not present

## 2022-09-29 DIAGNOSIS — R42 Dizziness and giddiness: Secondary | ICD-10-CM | POA: Diagnosis present

## 2022-09-29 DIAGNOSIS — S0101XA Laceration without foreign body of scalp, initial encounter: Secondary | ICD-10-CM | POA: Insufficient documentation

## 2022-09-29 DIAGNOSIS — W19XXXA Unspecified fall, initial encounter: Secondary | ICD-10-CM | POA: Insufficient documentation

## 2022-09-29 DIAGNOSIS — R55 Syncope and collapse: Secondary | ICD-10-CM | POA: Insufficient documentation

## 2022-09-29 DIAGNOSIS — Z7982 Long term (current) use of aspirin: Secondary | ICD-10-CM | POA: Insufficient documentation

## 2022-09-29 LAB — CBC
HCT: 56.8 % — ABNORMAL HIGH (ref 39.0–52.0)
Hemoglobin: 17.9 g/dL — ABNORMAL HIGH (ref 13.0–17.0)
MCH: 28.4 pg (ref 26.0–34.0)
MCHC: 31.5 g/dL (ref 30.0–36.0)
MCV: 90 fL (ref 80.0–100.0)
Platelets: 210 10*3/uL (ref 150–400)
RBC: 6.31 MIL/uL — ABNORMAL HIGH (ref 4.22–5.81)
RDW: 13.2 % (ref 11.5–15.5)
WBC: 11.4 10*3/uL — ABNORMAL HIGH (ref 4.0–10.5)
nRBC: 0 % (ref 0.0–0.2)

## 2022-09-29 LAB — DIFFERENTIAL
Abs Immature Granulocytes: 0.05 10*3/uL (ref 0.00–0.07)
Basophils Absolute: 0 10*3/uL (ref 0.0–0.1)
Basophils Relative: 0 %
Eosinophils Absolute: 0.2 10*3/uL (ref 0.0–0.5)
Eosinophils Relative: 2 %
Immature Granulocytes: 0 %
Lymphocytes Relative: 2 %
Lymphs Abs: 0.3 10*3/uL — ABNORMAL LOW (ref 0.7–4.0)
Monocytes Absolute: 0.6 10*3/uL (ref 0.1–1.0)
Monocytes Relative: 5 %
Neutro Abs: 10.3 10*3/uL — ABNORMAL HIGH (ref 1.7–7.7)
Neutrophils Relative %: 91 %

## 2022-09-29 LAB — APTT: aPTT: 26 seconds (ref 24–36)

## 2022-09-29 LAB — I-STAT CHEM 8, ED
BUN: 24 mg/dL — ABNORMAL HIGH (ref 8–23)
Calcium, Ion: 1.03 mmol/L — ABNORMAL LOW (ref 1.15–1.40)
Chloride: 101 mmol/L (ref 98–111)
Creatinine, Ser: 1 mg/dL (ref 0.61–1.24)
Glucose, Bld: 123 mg/dL — ABNORMAL HIGH (ref 70–99)
HCT: 56 % — ABNORMAL HIGH (ref 39.0–52.0)
Hemoglobin: 19 g/dL — ABNORMAL HIGH (ref 13.0–17.0)
Potassium: 5.4 mmol/L — ABNORMAL HIGH (ref 3.5–5.1)
Sodium: 136 mmol/L (ref 135–145)
TCO2: 28 mmol/L (ref 22–32)

## 2022-09-29 LAB — CBG MONITORING, ED: Glucose-Capillary: 128 mg/dL — ABNORMAL HIGH (ref 70–99)

## 2022-09-29 LAB — RAPID URINE DRUG SCREEN, HOSP PERFORMED
Amphetamines: NOT DETECTED
Barbiturates: NOT DETECTED
Benzodiazepines: NOT DETECTED
Cocaine: NOT DETECTED
Opiates: NOT DETECTED
Tetrahydrocannabinol: NOT DETECTED

## 2022-09-29 LAB — COMPREHENSIVE METABOLIC PANEL
ALT: 12 U/L (ref 0–44)
AST: 28 U/L (ref 15–41)
Albumin: 4.5 g/dL (ref 3.5–5.0)
Alkaline Phosphatase: 90 U/L (ref 38–126)
Anion gap: 10 (ref 5–15)
BUN: 16 mg/dL (ref 8–23)
CO2: 25 mmol/L (ref 22–32)
Calcium: 9.1 mg/dL (ref 8.9–10.3)
Chloride: 100 mmol/L (ref 98–111)
Creatinine, Ser: 0.98 mg/dL (ref 0.61–1.24)
GFR, Estimated: >60 mL/min (ref >60)
Glucose, Bld: 119 mg/dL — ABNORMAL HIGH (ref 70–99)
Potassium: 4.4 mmol/L (ref 3.5–5.1)
Sodium: 135 mmol/L (ref 135–145)
Total Bilirubin: 1.6 mg/dL — ABNORMAL HIGH (ref 0.3–1.2)
Total Protein: 8.3 g/dL — ABNORMAL HIGH (ref 6.5–8.1)

## 2022-09-29 LAB — PROTIME-INR
INR: 1 (ref 0.8–1.2)
Prothrombin Time: 13.2 seconds (ref 11.4–15.2)

## 2022-09-29 MED ORDER — DICYCLOMINE HCL 20 MG PO TABS: 20.0000 mg | ORAL_TABLET | Freq: Two times a day (BID) | ORAL | 0 refills | Status: DC

## 2022-09-29 MED ORDER — ONDANSETRON HCL 4 MG/2ML IJ SOLN
4.0000 mg | Freq: Once | INTRAMUSCULAR | Status: AC
  Administered 2022-09-29: 4 mg via INTRAVENOUS
  Filled 2022-09-29: qty 2

## 2022-09-29 MED ORDER — ONDANSETRON 4 MG PO TBDP
4.0000 mg | ORAL_TABLET | Freq: Once | ORAL | Status: AC
  Administered 2022-09-29: 4 mg via ORAL
  Filled 2022-09-29: qty 1

## 2022-09-29 MED ORDER — ONDANSETRON 8 MG PO TBDP: 8.0000 mg | ORAL_TABLET | Freq: Three times a day (TID) | ORAL | 0 refills | Status: DC | PRN

## 2022-09-29 NOTE — Progress Notes (Signed)
[place] Stroke alert.

## 2022-09-29 NOTE — ED Triage Notes (Signed)
States that he has been having abdominal pain since early in the morning, Has a syncopal episode after eating [employer] while at work. During fall he hit his head has a laceration noted to front of his head. states that they have been changing his BP medications, unsure which one. EMS states systolic was 90.

## 2022-09-29 NOTE — Progress Notes (Signed)
[facility] Telemedicine Consult   Requesting Provider: [place] Participants: patient and bedside Rn melinda, at[facility] RN Mardene Celeste Location of the provider: [facility] stroke center Location of the patient: [facility] ER  This consult was provided via telemedicine with 2-way video and audio communication. The patient/family was informed that care would be provided in this way and agreed to receive care in this manner.    Chief Complaint: dizziness and falls code stroke  HPI: Mr. Margaretmary Bayley is a 82 year old Caucasian male seen today for telestroke consult for code stroke.  History is obtained from the patient and review of electronic medical records.  Patient states he woke up at 530 this morning and felt discomfort and pain in belly.  This has persisted since then.  When he got up and tried to go to the restroom he fell down and hit his forehead on a plastic trash can.  Since then he is very dizzy and off balance and has fallen several times.  Denies any headache, vertigo, diplopia, focal extremity weakness numbness or loss of vision.  Denies any prior history of strokes TIA seizures or significant neurological problems. Had a likely syncopal episode after eating [employer] while at work. During fall he hit his head has a laceration noted to front of his head. states that they have been changing his BP medications, unsure which one. EMS states systolic was 90.  Patient does complain of significant nausea and vomited once and his main complaint at the present seems to be abdominal discomfort and nausea.   LKW: Last night tpa given?: No, outside window and no focal deficits IR Thrombectomy? No, not LVO by clinical exam Modified Rankin Scale: 0-Completely asymptomatic and back to baseline post- stroke Time of teleneurologist evaluation:  15 59  Exam: Vitals:   09/29/22 1546  BP: (!) 151/79  Pulse: 83  Resp: 15  SpO2: 100%    General: Pleasant elderly Caucasian male not in  distress.  1A: Level of Consciousness - 0 1B: Ask Month and Age - 0 1C: 'Blink Eyes' & 'Squeeze Hands' - 0 2: Test Horizontal Extraocular Movements - 0 3: Test Visual Fields - 0 4: Test Facial Palsy - 0 5A: Test Left Arm Motor Drift - 0 5B: Test Right Arm Motor Drift - 0 6A: Test Left Leg Motor Drift - 0 6B: Test Right Leg Motor Drift - 0 7: Test Limb Ataxia - 0 8: Test Sensation - 0 9: Test Language/Aphasia- 0 10: Test Dysarthria - 0 11: Test Extinction/Inattention - 0 NIHSS score: 0   Imaging Reviewed:  CT head wo shows age-appropriate changes of chronic small vessel disease.  No acute abnormality. CT angiogram not obtained as patient has history of dye allergy. CT scan of the abdomen and pelvis is pending MRI brain 01/16/2019 shows mild changes of small vessel disease and paranasal sinusitis.  No acute abnormality  Labs reviewed in epic and pertinent values follow: Pending at this time   Assessment: 82 year old Caucasian male presenting with abdominal pain and discomfort and nausea from this morning with several episodes of falls and dizziness without focal neurological findings of unclear etiology possibly presyncopal events. Stroke would be unlikely given lack of focal neurological findings.  Recommendations: Patient has presented outside time window for thrombolysis and does not have any focal neurological deficits consider thrombolysis at this time.  Recommend check CT scan of the abdomen pelvis and renal causes for abdominal pain and discomfort.  Check orthostatic vital signs.  The patient is still  unable to walk may consider obtaining MRI scan of the brain to look for small brainstem/cerebellar infarct not visualized on CT.  May need admission to medical team for further evaluation.  Neurology team can follow on consults as needed.  Long discussion with patient and answered questions.  Discussed with ER physician. Kindly call back for questions.  This patient is receiving  care for possible acute neurological changes. There was 60 minutes of care by this provider at the time of service, including time for direct evaluation via telemedicine, review of medical records, imaging studies and discussion of findings with providers, the patient and/or family.  Antony Contras MD [facility] [phone]  If 7pm- 7am, please page neurology on call as listed in [place].

## 2022-09-29 NOTE — Progress Notes (Signed)
Patient to CT department 

## 2022-09-29 NOTE — Discharge Instructions (Addendum)
You were seen emergency room for dizziness, nausea, abdominal pain and fainting.  Please continue to take daily aspirin.  Please follow-up with the neurology doctors by calling the number provided.  Call Dr. Watt Climes, GI doctor for a follow-up as well.

## 2022-09-29 NOTE — Progress Notes (Signed)
Neurologist, Dr Antony Contras, joins the telemedicine cart at 1559.

## 2022-09-29 NOTE — Clinical Note (Incomplete)
Lab Results  Component Value Date   HGBA1C 5.4 01/17/2019   Lab Results  Component Value Date   CHOL 195 01/17/2019   HDL 49 01/17/2019   LDLCALC 130 (H) 01/17/2019   TRIG 78 01/17/2019   CHOLHDL 4.0 01/17/2019

## 2022-09-29 NOTE — ED Provider Notes (Signed)
[place] EMERGENCY DEPARTMENT AT [facility] Provider Note   CSN: [id number] Arrival date & time: 09/29/22  1502     History {Add pertinent medical, surgical, social history, OB history to HPI:1} Chief Complaint  Patient presents with   Abdominal Pain   Loss of Consciousness    Robert Clarke is a 82 y.o. male.  HPI     Home Medications Prior to Admission medications   Medication Sig Start Date End Date Taking? Authorizing Provider  dicyclomine (BENTYL) 20 MG tablet Take 1 tablet (20 mg total) by mouth 2 (two) times daily. 09/29/22  Yes Jabbar Palmero, MD  ondansetron (ZOFRAN-ODT) 8 MG disintegrating tablet Take 1 tablet (8 mg total) by mouth every 8 (eight) hours as needed for nausea. 09/29/22  Yes Yamil Oelke, MD  ASPIRIN 81 PO Take 81 mg by mouth daily.    [provider]  fluticasone (FLONASE) 50 MCG/ACT nasal spray Place 1 spray into both nostrils daily.     [provider]  levothyroxine (SYNTHROID, LEVOTHROID) 137 MCG tablet Take 137 mcg by mouth daily.     [provider]  omeprazole (PRILOSEC) 40 MG capsule Take 40 mg by mouth daily.     [provider]  telmisartan (MICARDIS) 80 MG tablet Take 80 mg by mouth daily.    [provider]      Allergies    Iodine, Iodine-131, and Ivp dye [iodinated contrast media]    Review of Systems   Review of Systems  Physical Exam Updated Vital Signs BP 134/84   Pulse 93   Temp 98.4 F (36.9 C)   Resp 15   Ht '6\' 4"'$  (1.93 m)   Wt 108 kg   SpO2 95%   BMI 28.98 kg/m  Physical Exam  ED Results / Procedures / Treatments   Labs (all labs ordered are listed, but only abnormal results are displayed) Labs Reviewed  CBC - Abnormal; Notable for the following components:      Result Value   WBC 11.4 (*)    RBC 6.31 (*)    Hemoglobin 17.9 (*)    HCT 56.8 (*)    All other components within normal limits  DIFFERENTIAL - Abnormal; Notable for the following components:    Neutro Abs 10.3 (*)    Lymphs Abs 0.3 (*)    All other components within normal limits  COMPREHENSIVE METABOLIC PANEL - Abnormal; Notable for the following components:   Glucose, Bld 119 (*)    Total Protein 8.3 (*)    Total Bilirubin 1.6 (*)    All other components within normal limits  I-STAT CHEM 8, ED - Abnormal; Notable for the following components:   Potassium 5.4 (*)    BUN 24 (*)    Glucose, Bld 123 (*)    Calcium, Ion 1.03 (*)    Hemoglobin 19.0 (*)    HCT 56.0 (*)    All other components within normal limits  CBG MONITORING, ED - Abnormal; Notable for the following components:   Glucose-Capillary 128 (*)    All other components within normal limits  PROTIME-INR  APTT  RAPID URINE DRUG SCREEN, HOSP PERFORMED  ETHANOL    EKG EKG Interpretation  Date/Time:  Friday September 29 2022 16:18:40 EST Ventricular Rate:  85 PR Interval:  179 QRS Duration: 120 QT Interval:  365 QTC Calculation: 434 R Axis:   83 Text Interpretation: Sinus rhythm Consider left atrial enlargement Nonspecific intraventricular conduction delay ST elevation, consider inferior injury  No acute changes No significant change since last tracing Confirmed by Varney Biles [phone]) on 09/29/2022 9:11:01 PM  Radiology MR BRAIN WO CONTRAST  Result Date: 09/29/2022 CLINICAL DATA:  Headache, syncopal episode, fall EXAM: MRI HEAD WITHOUT CONTRAST MRA HEAD WITHOUT CONTRAST TECHNIQUE: Multiplanar, multi-echo pulse sequences of the brain and surrounding structures were acquired without intravenous contrast. Angiographic images of the Circle of Willis were acquired using MRA technique without intravenous contrast. COMPARISON:  MRI head 01/16/2019, no previous MR a; correlation is made with CT head 09/29/2022 and CTA head and neck 01/16/2019 FINDINGS: MRI HEAD FINDINGS Brain: No restricted diffusion to suggest acute or subacute infarct. No acute hemorrhage, mass, mass effect, or midline shift. No hydrocephalus or  extra-axial collection. Normal pituitary and craniocervical junction. No hemosiderin deposition to suggest remote hemorrhage. Vascular: Normal arterial flow voids. Skull and upper cervical spine: Normal marrow signal. Sinuses/Orbits: Mucosal thickening in the ethmoid air cells. No acute finding in the orbits. Status post bilateral lens replacements. Other: The mastoid air cells are well aerated. MRA HEAD FINDINGS Anterior circulation: Both internal carotid arteries are patent to the termini, without significant stenosis. A1 segments patent. Normal anterior communicating artery. Anterior cerebral arteries are patent to their distal aspects. No M1 stenosis or occlusion. Normal MCA bifurcations. Distal MCA branches perfused and symmetric. Posterior circulation: Vertebral arteries patent to the vertebrobasilar junction without stenosis. Posterior inferior cerebral arteries patent bilaterally. Basilar patent to its distal aspect. Superior cerebellar arteries patent bilaterally. Patent P1 segments. Severe, likely near occlusive stenosis in the proximal right P2 (series 5, images 124-130, which is new from the prior CTA. PCAs otherwise perfused to their distal aspects without stenosis. The bilateral posterior communicating arteries are not visualized. Anatomic variants: None significant IMPRESSION: 1. No acute intracranial process. 2. Severe, likely near occlusive stenosis in the proximal right P2, which is new from the prior CTA. 3. No other significant intracranial stenosis or occlusion. These results were called by telephone at the time of interpretation on 09/29/2022 at 7:22 pm to provider [facility] , who verbally acknowledged these results. Electronically Signed   By: Merilyn Baba M.D.   On: 09/29/2022 19:22   MR ANGIO HEAD WO CONTRAST  Result Date: 09/29/2022 CLINICAL DATA:  Headache, syncopal episode, fall EXAM: MRI HEAD WITHOUT CONTRAST MRA HEAD WITHOUT CONTRAST TECHNIQUE: Multiplanar, multi-echo pulse  sequences of the brain and surrounding structures were acquired without intravenous contrast. Angiographic images of the Circle of Willis were acquired using MRA technique without intravenous contrast. COMPARISON:  MRI head 01/16/2019, no previous MR a; correlation is made with CT head 09/29/2022 and CTA head and neck 01/16/2019 FINDINGS: MRI HEAD FINDINGS Brain: No restricted diffusion to suggest acute or subacute infarct. No acute hemorrhage, mass, mass effect, or midline shift. No hydrocephalus or extra-axial collection. Normal pituitary and craniocervical junction. No hemosiderin deposition to suggest remote hemorrhage. Vascular: Normal arterial flow voids. Skull and upper cervical spine: Normal marrow signal. Sinuses/Orbits: Mucosal thickening in the ethmoid air cells. No acute finding in the orbits. Status post bilateral lens replacements. Other: The mastoid air cells are well aerated. MRA HEAD FINDINGS Anterior circulation: Both internal carotid arteries are patent to the termini, without significant stenosis. A1 segments patent. Normal anterior communicating artery. Anterior cerebral arteries are patent to their distal aspects. No M1 stenosis or occlusion. Normal MCA bifurcations. Distal MCA branches perfused and symmetric. Posterior circulation: Vertebral arteries patent to the vertebrobasilar junction without stenosis. Posterior inferior cerebral arteries patent bilaterally. Basilar patent to its distal  aspect. Superior cerebellar arteries patent bilaterally. Patent P1 segments. Severe, likely near occlusive stenosis in the proximal right P2 (series 5, images 124-130, which is new from the prior CTA. PCAs otherwise perfused to their distal aspects without stenosis. The bilateral posterior communicating arteries are not visualized. Anatomic variants: None significant IMPRESSION: 1. No acute intracranial process. 2. Severe, likely near occlusive stenosis in the proximal right P2, which is new from the prior  CTA. 3. No other significant intracranial stenosis or occlusion. These results were called by telephone at the time of interpretation on 09/29/2022 at 7:22 pm to provider [facility] , who verbally acknowledged these results. Electronically Signed   By: Merilyn Baba M.D.   On: 09/29/2022 19:22   CT ABDOMEN PELVIS WO CONTRAST  Result Date: 09/29/2022 CLINICAL DATA:  Acute abdominal pain EXAM: CT ABDOMEN AND PELVIS WITHOUT CONTRAST TECHNIQUE: Multidetector CT imaging of the abdomen and pelvis was performed following the standard protocol without IV contrast. RADIATION DOSE REDUCTION: This exam was performed according to the departmental dose-optimization program which includes automated exposure control, adjustment of the mA and/or kV according to patient size and/or use of iterative reconstruction technique. COMPARISON:  09/23/2008 FINDINGS: Lower chest: Peripheral reticulonodular opacities in both lower lobes favoring atypical infectious bronchiolitis. Small calcified lower mediastinal lymph nodes compatible with old granulomatous disease. Right coronary artery atherosclerosis. Mild calcification of the mitral valve. Descending thoracic aortic atherosclerosis. Calcified granuloma in the right lower lobe. Dilated esophagus, with open/patulous connection to the stomach on image 15 series 2, possibly from dysmotility or active reflux. Elevated left hemidiaphragm, sitting about 5.3 cm above the right. Hepatobiliary: Small depending gallstones in the gallbladder. No biliary dilatation. Pancreas: Unremarkable Spleen: Unremarkable Adrenals/Urinary Tract: Small calcification along the lateral limb left adrenal gland, unremarkable Stomach/Bowel: Fluid-filled stomach. Normal appendix. There is some formed stool in the distal colon. Sigmoid colon diverticulosis without active diverticulitis. Scattered air-fluid levels in nondilated loops of proximal small bowel without overt bowel wall thickening. Vascular/Lymphatic:  Atherosclerosis is present, including aortoiliac atherosclerotic disease. No pathologic adenopathy. Reproductive: Prostatectomy. Other: No supplemental non-categorized findings. Musculoskeletal: Bridging spurring of the sacroiliac joints. Lumbar and lower thoracic spondylosis and degenerative disc disease leading to multilevel lumbar impingement. IMPRESSION: 1. Scattered air-fluid levels in nondilated loops of jejunum. No definite wall thickening. The scattered air-fluid levels could potentially be a manifestation of a low-grade enteritis. 2. Peripheral reticulonodular opacities in both lower lobes favoring atypical infectious bronchiolitis. 3. Dilated esophagus with open/patulous connection to the stomach, possibly from dysmotility or active reflux. 4. Cholelithiasis. 5. Sigmoid colon diverticulosis without active diverticulitis. 6. Multilevel lumbar impingement. 7. Aortic atherosclerosis. Aortic Atherosclerosis (ICD10-I70.0). Electronically Signed   By: Van Clines M.D.   On: 09/29/2022 16:52   CT HEAD CODE STROKE WO CONTRAST  Result Date: 09/29/2022 CLINICAL DATA:  Code stroke.  Syncope EXAM: CT HEAD WITHOUT CONTRAST TECHNIQUE: Contiguous axial images were obtained from the base of the skull through the vertex without intravenous contrast. RADIATION DOSE REDUCTION: This exam was performed according to the departmental dose-optimization program which includes automated exposure control, adjustment of the mA and/or kV according to patient size and/or use of iterative reconstruction technique. COMPARISON:  MRI Head 01/16/19, CT head 01/16/19 FINDINGS: Brain: No evidence of acute infarction, hemorrhage, hydrocephalus, extra-axial collection or mass lesion/mass effect. Vascular: Diffusely hyperdense appearance of the intracranial vasculature, nonspecific, and unchanged. Skull: Normal. Negative for fracture or focal lesion. Sinuses/Orbits: Small right middle ear effusion versus soft tissue (series 4, image 5).  Mild mucosal  thickening in the bilateral maxillary ethmoid sinuses. No mastoid effusion. Bilateral lens replacement. Orbits are otherwise unremarkable. Other: None. ASPECTS ([place] Stroke Program Early CT Score): 10 IMPRESSION: 1. No hemorrhage or CT evidence of an acute infarct.  Aspects is 10. 2. Diffusely hyperdense appearance of the intracranial vasculature, nonspecific and unchanged from prior exam. Discussed with Dr. Kathrynn Humble on 09/29/22 at 4:48 PM. Electronically Signed   By: Marin Roberts M.D.   On: 09/29/2022 16:49    Procedures Procedures  {Document cardiac monitor, telemetry assessment procedure when appropriate:1}  Medications Ordered in ED Medications  ondansetron (ZOFRAN) injection 4 mg (4 mg Intravenous Given 09/29/22 1614)    ED Course/ Medical Decision Making/ A&P   {   Click here for ABCD2, HEART and other calculatorsREFRESH Note before signing :1}                          Medical Decision Making Amount and/or Complexity of Data Reviewed Labs: ordered. Radiology: ordered.  Risk Prescription drug management.   ***  {Document critical care time when appropriate:1} {Document review of labs and clinical decision tools ie heart score, Chads2Vasc2 etc:1}  {Document your independent review of radiology images, and any outside records:1} {Document your discussion with family members, caretakers, and with consultants:1} {Document social determinants of health affecting pt's care:1} {Document your decision making why or why not admission, treatments were needed:1} Final Clinical Impression(s) / ED Diagnoses Final diagnoses:  Insufficiency of posterior brain circulation  Generalized abdominal pain  Enteritis  Syncope and collapse    Rx / DC Orders ED Discharge Orders          Ordered    Ambulatory referral to Neurology       Comments: An appointment is requested in approximately: 1 week   09/29/22 2127    ondansetron (ZOFRAN-ODT) 8 MG disintegrating tablet  Every 8  hours PRN        09/29/22 2134    dicyclomine (BENTYL) 20 MG tablet  2 times daily        09/29/22 2134

## 2022-10-09 ENCOUNTER — Ambulatory Visit: Payer: Medicare HMO | Admitting: Diagnostic Neuroimaging

## 2022-10-10 ENCOUNTER — Ambulatory Visit: Payer: Self-pay | Admitting: General Surgery

## 2022-10-10 NOTE — H&P (Signed)
PATIENT PROFILE: Robert Clarke is a 82 y.o. male who presents to the Clinic for consultation at the request of Jerelene Redden, NP for evaluation of hemorrhoids.  PCP:  Harrold Donath, MD  HISTORY OF PRESENT ILLNESS: Mr. Blackmer reports having a lot of mucus drainage 58-month  He endorses that he needs to use a pad due to the copious amount of drainage.  He denies any significant pain.  No pain radiation.  He cannot identify any alleviating or aggravating factors.  He had a colonoscopy couple of years ago that showed multiple polyps but no masses.  Since the last year he has been getting more aggravated.  He denies any weight loss.  He has intermittent bleeding with wiping.  He denies any constipation.  He denies any straining.   PROBLEM LIST: Problem List  Date Reviewed: 10/05/2022          Noted   Cerebral vascular disease 10/05/2022   Overview    Tight posterior circulation, sees neuro. Cannot take statins as noted and doesn't want repatha or bempodoic acid      Prediabetes 06/03/2021   Essential hypertension 06/02/2021   Hypercholesteremia 06/02/2021   Overview    Insurance wont cover bempodoic and declines repatha      Hypothyroid 06/02/2021   Adenomatous polyp of colon 06/02/2021   Overview    Very frequent, Medoff      Myalgia due to statin 06/02/2021   Health care maintenance 06/02/2021   Overview    MEDICARE WELLNESS VISIT   PROVIDERS RENDERING CARE Dr. AOuida Sills [facility], Dr. MEarlean Shawl(GI)  FUNCTIONAL ASSESSMENT  (1) Hearing: Demonstrates normal hearing in conversation.  (2) Risk of Falls: No reports of falls or abnormal balance. Gait is observed to be good upon observation.  (3) Home Safety; Home is safe and secure (4) Activities of Daily Living; Household chores and grooming are managed without problems. Personal finances are managed without problems.   DEPRESSION SCREENING There does not seem to be loss of interest in activities nor excess crying or changes  in sleep or appetite.   COGNITIVE SCREENING Orientation is appropriate as are responses to questions and general conversation. No reports of forgetfulness or losing things.    PREVENTION PLAN Cardiovascular: Exercise and cholesterol followed on diet  Diabetes: Followed yearly  Colon Cancer: Dr. MEarlean Shawldoes frequently with a lot of polyps  Glaucoma: Yearly  [facility] Shingles: Considering  Influenza: Flu yearly  Smoking Cessation: NA   OTHER PERSONALIZED HEALTH ADVISE  Goals      Maintain health/healthy lifestyle       Continue exercise and activity   [place]         MFrazier RichardsMD             Varicose veins of left leg with edema 05/03/2018    GENERAL REVIEW OF SYSTEMS:   General ROS: negative for - chills, fatigue, fever, weight gain or weight loss Allergy and Immunology ROS: negative for - hives  Hematological and Lymphatic ROS: negative for - bleeding problems or bruising, negative for palpable nodes Endocrine ROS: negative for - heat or cold intolerance, hair changes Respiratory ROS: negative for - cough, shortness of breath or wheezing Cardiovascular ROS: no chest pain or palpitations GI ROS: negative for nausea, vomiting, abdominal pain, diarrhea, constipation Musculoskeletal ROS: negative for - joint swelling or muscle pain Neurological ROS: negative for - confusion, syncope Dermatological ROS: negative  for pruritus and rash Psychiatric: negative for anxiety, depression, difficulty sleeping and memory loss  MEDICATIONS: Current Outpatient Medications  Medication Sig Dispense Refill   amLODIPine (NORVASC) 5 MG tablet Take 1 tablet (5 mg total) by mouth once daily 90 tablet 1   aspirin 81 MG EC tablet Take 81 mg by mouth once daily     azelastine (ASTELIN) 137 mcg nasal spray Place 2 sprays into both nostrils 2 (two) times daily 30 mL 11   dicyclomine (BENTYL) 20 mg tablet Take 20 mg by mouth 2  (two) times daily (Patient not taking: Reported on 10/10/2022)     docosahexaenoic acid/epa (FISH OIL ORAL) Take 2,000 mg by mouth daily with dinner     doxycycline (VIBRAMYCIN) 100 MG capsule Take 100 mg by mouth 2 (two) times daily as needed (Patient not taking: Reported on 10/10/2022)     Lactobacillus acidophilus (PROBIOTIC ORAL) Take by mouth once daily     levothyroxine (SYNTHROID) 150 MCG tablet Take 1 tablet (150 mcg total) by mouth once daily Take on an empty stomach with a glass of water at least 30-60 minutes before breakfast. 90 tablet 3   omeprazole (PRILOSEC) 40 MG DR capsule Take 1 capsule (40 mg total) by mouth once daily 90 capsule 3   ondansetron (ZOFRAN-ODT) 8 MG disintegrating tablet Take 8 mg by mouth every 8 (eight) hours as needed for Nausea or Vomiting (Patient not taking: Reported on 10/10/2022)     telmisartan (MICARDIS) 80 MG tablet Take 1 tablet (80 mg total) by mouth once daily 90 tablet 1   No current facility-administered medications for this visit.    ALLERGIES: Iodinated contrast media, Iodine-131, Atorvastatin, Ezetimibe, and Pravastatin sodium  PAST MEDICAL HISTORY: Past Medical History:  Diagnosis Date   Basal cell carcinoma    GERD (gastroesophageal reflux disease) 1990   Hyperlipidemia 1980   Hypertension    Prostate cancer (CMS-HCC)    Prostate cancer (CMS-HCC)    Thyroid disease     PAST SURGICAL HISTORY: Past Surgical History:  Procedure Laterality Date   THYROIDECTOMY TOTAL  1999   LENS EYE SURGERY Bilateral 2017   Basal Cell Carcinoma     under left eye   CATARACT EXTRACTION  2019   [place]  2000   Prostate removed   PROSTATECTOMY PERINEAL       FAMILY HISTORY: Family History  Problem Relation Age of Onset   High blood pressure (Hypertension) Mother    Hip fracture Mother    Colon cancer Father      SOCIAL HISTORY: Social History   Socioeconomic History   Marital status: Married  Tobacco Use    Smoking status: Never   Smokeless tobacco: Never  Vaping Use   Vaping Use: Never used  Substance and Sexual Activity   Alcohol use: Yes    Alcohol/week: 3.0 standard drinks of alcohol    Types: 3 Glasses of wine per week   Drug use: Never   Sexual activity: Yes    Partners: Female    PHYSICAL EXAM: Vitals:   10/10/22 1310  BP: 139/73  Pulse: 55   Body mass index is 29.91 kg/m. Weight: (!) 108 kg (238 lb)   GENERAL: Alert, active, oriented x3  HEENT: Pupils equal reactive to light. Extraocular movements are intact. Sclera clear. Palpebral conjunctiva normal red color.Pharynx clear.  NECK: Supple with no palpable mass and no adenopathy.  LUNGS: Sound clear with no rales rhonchi or wheezes.  HEART: Regular rhythm S1 and S2 without murmur.  ABDOMEN: Soft and depressible, nontender with no palpable mass, no hepatomegaly.  RECTAL: adequate rectal tone. No masses. Palpable hemorrhoids. No fissures.   EXTREMITIES: Well-developed well-nourished symmetrical with no dependent edema.  NEUROLOGICAL: Awake alert oriented, facial expression symmetrical, moving all extremities.  REVIEW OF DATA: I have reviewed the following data today: Office Visit on 10/05/2022  Component Date Value   Glucose 10/05/2022 85    Sodium 10/05/2022 138    Potassium 10/05/2022 4.3    Chloride 10/05/2022 99    Carbon Dioxide (CO2) 10/05/2022 31.2    Urea Nitrogen (BUN) 10/05/2022 13    Creatinine 10/05/2022 0.9    Glomerular Filtration Ra* 10/05/2022 86    Calcium 10/05/2022 9.7    AST  10/05/2022 22    ALT  10/05/2022 16    Alk Phos (alkaline Phosp* 10/05/2022 82    Albumin 10/05/2022 4.2    Bilirubin, Total 10/05/2022 1.0    Protein, Total 10/05/2022 6.6    A/G Ratio 10/05/2022 1.8    Hemoglobin A1C 10/05/2022 5.8 (H)    Average Blood Glucose (C* 10/05/2022 120    Cholesterol, Total 10/05/2022 181    Triglyceride 10/05/2022 134    HDL (High Density Lipopr* 10/05/2022 45.2    LDL  Calculated 10/05/2022 109    VLDL Cholesterol 10/05/2022 27    Cholesterol/HDL Ratio 10/05/2022 4.0    Thyroid Stimulating Horm* 10/05/2022 5.355 (H)      ASSESSMENT: Mr. Esty is a 82 y.o. male presenting for consultation for Hemorrhoids.    Patient was found on physical exam with finding of internal and external hemorrhoids. Patient oriented about the diagnosis of hemorrhoids, its function and anatomy. The different treatment alternatives were discussed (conservative management, office procedures and surgical treatment).   Due to the fact the patient has chronic hemorrhoids on physical exam are very enlarged and with copious amount of drainage I think that patient will benefit of rectal exam under anesthesia with possible hemorrhoidectomy.  I do not think that this will resolve with medical management at this point.  Patient was oriented about hemorrhoidectomy.  He was oriented about the risks that include bleeding, infection, pain, incontinence, among others.  The patient reported he understood and agreed to proceed  Internal and external bleeding hemorrhoids [K64.4, K64.8]  PLAN: Rectal exam under anesthesia with hemorrhoidectomy [id number]) Avoid taking aspirin 5 days before procedure Increase fiber in your diet. Over the counter fiber supplements are good source of fiber  Contact us if you have any concern.   Patient verbalized understanding, all questions were answered, and were agreeable with the plan outlined above.     Herbert Pun, MD  Electronically signed by Herbert Pun, MD

## 2022-10-10 NOTE — H&P (View-Only) (Signed)
PATIENT PROFILE: Adorian Cirilo is a 82 y.o. male who presents to the Clinic for consultation at the request of Jerelene Redden, NP for evaluation of hemorrhoids.  PCP:  Harrold Donath, MD  HISTORY OF PRESENT ILLNESS: Mr. Sermersheim reports having a lot of mucus drainage 98-month  He endorses that he needs to use a pad due to the copious amount of drainage.  He denies any significant pain.  No pain radiation.  He cannot identify any alleviating or aggravating factors.  He had a colonoscopy couple of years ago that showed multiple polyps but no masses.  Since the last year he has been getting more aggravated.  He denies any weight loss.  He has intermittent bleeding with wiping.  He denies any constipation.  He denies any straining.   PROBLEM LIST: Problem List  Date Reviewed: 10/05/2022          Noted   Cerebral vascular disease 10/05/2022   Overview    Tight posterior circulation, sees neuro. Cannot take statins as noted and doesn't want repatha or bempodoic acid      Prediabetes 06/03/2021   Essential hypertension 06/02/2021   Hypercholesteremia 06/02/2021   Overview    Insurance wont cover bempodoic and declines repatha      Hypothyroid 06/02/2021   Adenomatous polyp of colon 06/02/2021   Overview    Very frequent, Medoff      Myalgia due to statin 06/02/2021   Health care maintenance 06/02/2021   Overview    MEDICARE WELLNESS VISIT   PROVIDERS RENDERING CARE Dr. AOuida Sills [facility], Dr. MEarlean Shawl(GI)  FUNCTIONAL ASSESSMENT  (1) Hearing: Demonstrates normal hearing in conversation.  (2) Risk of Falls: No reports of falls or abnormal balance. Gait is observed to be good upon observation.  (3) Home Safety; Home is safe and secure (4) Activities of Daily Living; Household chores and grooming are managed without problems. Personal finances are managed without problems.   DEPRESSION SCREENING There does not seem to be loss of interest in activities nor excess crying or changes  in sleep or appetite.   COGNITIVE SCREENING Orientation is appropriate as are responses to questions and general conversation. No reports of forgetfulness or losing things.    PREVENTION PLAN Cardiovascular: Exercise and cholesterol followed on diet  Diabetes: Followed yearly  Colon Cancer: Dr. MEarlean Shawldoes frequently with a lot of polyps  Glaucoma: Yearly  [facility] Shingles: Considering  Influenza: Flu yearly  Smoking Cessation: NA   OTHER PERSONALIZED HEALTH ADVISE  Goals      Maintain health/healthy lifestyle       Continue exercise and activity   [place]         MFrazier RichardsMD             Varicose veins of left leg with edema 05/03/2018    GENERAL REVIEW OF SYSTEMS:   General ROS: negative for - chills, fatigue, fever, weight gain or weight loss Allergy and Immunology ROS: negative for - hives  Hematological and Lymphatic ROS: negative for - bleeding problems or bruising, negative for palpable nodes Endocrine ROS: negative for - heat or cold intolerance, hair changes Respiratory ROS: negative for - cough, shortness of breath or wheezing Cardiovascular ROS: no chest pain or palpitations GI ROS: negative for nausea, vomiting, abdominal pain, diarrhea, constipation Musculoskeletal ROS: negative for - joint swelling or muscle pain Neurological ROS: negative for - confusion, syncope Dermatological ROS: negative  for pruritus and rash Psychiatric: negative for anxiety, depression, difficulty sleeping and memory loss  MEDICATIONS: Current Outpatient Medications  Medication Sig Dispense Refill   amLODIPine (NORVASC) 5 MG tablet Take 1 tablet (5 mg total) by mouth once daily 90 tablet 1   aspirin 81 MG EC tablet Take 81 mg by mouth once daily     azelastine (ASTELIN) 137 mcg nasal spray Place 2 sprays into both nostrils 2 (two) times daily 30 mL 11   dicyclomine (BENTYL) 20 mg tablet Take 20 mg by mouth 2  (two) times daily (Patient not taking: Reported on 10/10/2022)     docosahexaenoic acid/epa (FISH OIL ORAL) Take 2,000 mg by mouth daily with dinner     doxycycline (VIBRAMYCIN) 100 MG capsule Take 100 mg by mouth 2 (two) times daily as needed (Patient not taking: Reported on 10/10/2022)     Lactobacillus acidophilus (PROBIOTIC ORAL) Take by mouth once daily     levothyroxine (SYNTHROID) 150 MCG tablet Take 1 tablet (150 mcg total) by mouth once daily Take on an empty stomach with a glass of water at least 30-60 minutes before breakfast. 90 tablet 3   omeprazole (PRILOSEC) 40 MG DR capsule Take 1 capsule (40 mg total) by mouth once daily 90 capsule 3   ondansetron (ZOFRAN-ODT) 8 MG disintegrating tablet Take 8 mg by mouth every 8 (eight) hours as needed for Nausea or Vomiting (Patient not taking: Reported on 10/10/2022)     telmisartan (MICARDIS) 80 MG tablet Take 1 tablet (80 mg total) by mouth once daily 90 tablet 1   No current facility-administered medications for this visit.    ALLERGIES: Iodinated contrast media, Iodine-131, Atorvastatin, Ezetimibe, and Pravastatin sodium  PAST MEDICAL HISTORY: Past Medical History:  Diagnosis Date   Basal cell carcinoma    GERD (gastroesophageal reflux disease) 1990   Hyperlipidemia 1980   Hypertension    Prostate cancer (CMS-HCC)    Prostate cancer (CMS-HCC)    Thyroid disease     PAST SURGICAL HISTORY: Past Surgical History:  Procedure Laterality Date   THYROIDECTOMY TOTAL  1999   LENS EYE SURGERY Bilateral 2017   Basal Cell Carcinoma     under left eye   CATARACT EXTRACTION  2019   [place]  2000   Prostate removed   PROSTATECTOMY PERINEAL       FAMILY HISTORY: Family History  Problem Relation Age of Onset   High blood pressure (Hypertension) Mother    Hip fracture Mother    Colon cancer Father      SOCIAL HISTORY: Social History   Socioeconomic History   Marital status: Married  Tobacco Use    Smoking status: Never   Smokeless tobacco: Never  Vaping Use   Vaping Use: Never used  Substance and Sexual Activity   Alcohol use: Yes    Alcohol/week: 3.0 standard drinks of alcohol    Types: 3 Glasses of wine per week   Drug use: Never   Sexual activity: Yes    Partners: Female    PHYSICAL EXAM: Vitals:   10/10/22 1310  BP: 139/73  Pulse: 55   Body mass index is 29.91 kg/m. Weight: (!) 108 kg (238 lb)   GENERAL: Alert, active, oriented x3  HEENT: Pupils equal reactive to light. Extraocular movements are intact. Sclera clear. Palpebral conjunctiva normal red color.Pharynx clear.  NECK: Supple with no palpable mass and no adenopathy.  LUNGS: Sound clear with no rales rhonchi or wheezes.  HEART: Regular rhythm S1 and S2 without murmur.  ABDOMEN: Soft and depressible, nontender with no palpable mass, no hepatomegaly.  RECTAL: adequate rectal tone. No masses. Palpable hemorrhoids. No fissures.   EXTREMITIES: Well-developed well-nourished symmetrical with no dependent edema.  NEUROLOGICAL: Awake alert oriented, facial expression symmetrical, moving all extremities.  REVIEW OF DATA: I have reviewed the following data today: Office Visit on 10/05/2022  Component Date Value   Glucose 10/05/2022 85    Sodium 10/05/2022 138    Potassium 10/05/2022 4.3    Chloride 10/05/2022 99    Carbon Dioxide (CO2) 10/05/2022 31.2    Urea Nitrogen (BUN) 10/05/2022 13    Creatinine 10/05/2022 0.9    Glomerular Filtration Ra* 10/05/2022 86    Calcium 10/05/2022 9.7    AST  10/05/2022 22    ALT  10/05/2022 16    Alk Phos (alkaline Phosp* 10/05/2022 82    Albumin 10/05/2022 4.2    Bilirubin, Total 10/05/2022 1.0    Protein, Total 10/05/2022 6.6    A/G Ratio 10/05/2022 1.8    Hemoglobin A1C 10/05/2022 5.8 (H)    Average Blood Glucose (C* 10/05/2022 120    Cholesterol, Total 10/05/2022 181    Triglyceride 10/05/2022 134    HDL (High Density Lipopr* 10/05/2022 45.2    LDL  Calculated 10/05/2022 109    VLDL Cholesterol 10/05/2022 27    Cholesterol/HDL Ratio 10/05/2022 4.0    Thyroid Stimulating Horm* 10/05/2022 5.355 (H)      ASSESSMENT: Mr. Wolthuis is a 82 y.o. male presenting for consultation for Hemorrhoids.    Patient was found on physical exam with finding of internal and external hemorrhoids. Patient oriented about the diagnosis of hemorrhoids, its function and anatomy. The different treatment alternatives were discussed (conservative management, office procedures and surgical treatment).   Due to the fact the patient has chronic hemorrhoids on physical exam are very enlarged and with copious amount of drainage I think that patient will benefit of rectal exam under anesthesia with possible hemorrhoidectomy.  I do not think that this will resolve with medical management at this point.  Patient was oriented about hemorrhoidectomy.  He was oriented about the risks that include bleeding, infection, pain, incontinence, among others.  The patient reported he understood and agreed to proceed  Internal and external bleeding hemorrhoids [K64.4, K64.8]  PLAN: Rectal exam under anesthesia with hemorrhoidectomy [id number]) Avoid taking aspirin 5 days before procedure Increase fiber in your diet. Over the counter fiber supplements are good source of fiber  Contact us if you have any concern.   Patient verbalized understanding, all questions were answered, and were agreeable with the plan outlined above.     Herbert Pun, MD  Electronically signed by Herbert Pun, MD

## 2022-10-13 ENCOUNTER — Encounter
Admission: RE | Admit: 2022-10-13 | Discharge: 2022-10-13 | Disposition: A | Discharge status: Home / Self Care | Payer: No Typology Code available for payment source | Source: Ambulatory Visit | Attending: General Surgery | Admitting: General Surgery

## 2022-10-13 HISTORY — DX: Hyperlipidemia, unspecified: E78.5

## 2022-10-13 HISTORY — DX: Prediabetes: R73.03

## 2022-10-13 HISTORY — DX: Unspecified hemorrhoids: K64.9

## 2022-10-13 HISTORY — DX: Hypothyroidism, unspecified: E03.9

## 2022-10-13 HISTORY — DX: Pure hypercholesterolemia, unspecified: E78.00

## 2022-10-13 HISTORY — DX: Basal cell carcinoma of skin, unspecified: C44.91

## 2022-10-13 NOTE — Patient Instructions (Addendum)
Your procedure is scheduled on: Wednesday, February 21 Report to the Registration Desk on the 1st floor of the [facility]. To find out your arrival time, please call [phone] between 1PM - 3PM on: Tuesday, February 20 If your arrival time is 6:00 am, do not arrive before that time as the [place] entrance doors do not open until 6:00 am.  REMEMBER: Instructions that are not followed completely may result in serious medical risk, up to and including death; or upon the discretion of your surgeon and anesthesiologist your surgery may need to be rescheduled.  Do not eat or drink after midnight the night before surgery.  No gum chewing or hard candies.  One week prior to surgery: starting today, February 16 Stop Anti-inflammatories (NSAIDS) such as Advil, Aleve, Ibuprofen, Motrin, Naproxen, Naprosyn and Aspirin based products such as Excedrin, Goody's Powder, BC Powder. Stop ANY OVER THE COUNTER supplements until after surgery. You may however, continue to take Tylenol if needed for pain up until the day of surgery.  Continue taking all prescribed medications with the exception of the following:  Per Dr. Peyton Najjar: Avoid taking aspirin 5 days before procedure   TAKE ONLY THESE MEDICATIONS THE MORNING OF SURGERY WITH A SIP OF WATER:  Levothyroxine  Omeprazole (Prilosec) - (take one the night before and one on the morning of surgery - helps to prevent nausea after surgery.) Amlodipine  No Alcohol for 24 hours before or after surgery.  No Smoking including e-cigarettes for 24 hours before surgery.  No chewable tobacco products for at least 6 hours before surgery.  No nicotine patches on the day of surgery.  Do not use any "recreational" drugs for at least a week (preferably 2 weeks) before your surgery.  Please be advised that the combination of cocaine and anesthesia may have negative outcomes, up to and including death. If you test positive for cocaine, your surgery will be  cancelled.  On the morning of surgery brush your teeth with toothpaste and water, you may rinse your mouth with mouthwash if you wish. Do not swallow any toothpaste or mouthwash.  Do not wear jewelry, make-up, hairpins, clips or nail polish.  Do not wear lotions, powders, or perfumes.   Do not shave body hair from the neck down 48 hours before surgery.  Contact lenses, hearing aids and dentures may not be worn into surgery.  Do not bring valuables to the hospital. [facility] is not responsible for any missing/lost belongings or valuables.   Notify your doctor if there is any change in your medical condition (cold, fever, infection).  Wear comfortable clothing (specific to your surgery type) to the hospital.  After surgery, you can help prevent lung complications by doing breathing exercises.  Take deep breaths and cough every 1-2 hours. Your doctor may order a device called an Incentive Spirometer to help you take deep breaths.  If you are being discharged the day of surgery, you will not be allowed to drive home. You will need a responsible individual to drive you home and stay with you for 24 hours after surgery.   If you are taking public transportation, you will need to have a responsible individual with you.  Please call the [place] [facility] at [phone] if you have any questions about these instructions.  Surgery Visitation Policy:  Patients undergoing a surgery or procedure may have two family members or support persons with them as long as the person is not COVID-19 positive or experiencing its  symptoms.

## 2022-10-18 ENCOUNTER — Ambulatory Visit
Admission: RE | Admit: 2022-10-18 | Discharge: 2022-10-18 | Disposition: A | Discharge status: Home / Self Care | Payer: No Typology Code available for payment source | Attending: General Surgery | Admitting: General Surgery

## 2022-10-18 ENCOUNTER — Encounter: Payer: Self-pay | Admitting: General Surgery

## 2022-10-18 ENCOUNTER — Other Ambulatory Visit: Payer: Self-pay

## 2022-10-18 ENCOUNTER — Ambulatory Visit: Payer: No Typology Code available for payment source

## 2022-10-18 ENCOUNTER — Encounter
Admission: RE | Disposition: A | Discharge status: Home / Self Care | Payer: Self-pay | Source: Home / Self Care | Attending: General Surgery

## 2022-10-18 DIAGNOSIS — Z7982 Long term (current) use of aspirin: Secondary | ICD-10-CM | POA: Diagnosis not present

## 2022-10-18 DIAGNOSIS — K644 Residual hemorrhoidal skin tags: Secondary | ICD-10-CM | POA: Diagnosis not present

## 2022-10-18 DIAGNOSIS — K642 Third degree hemorrhoids: Secondary | ICD-10-CM | POA: Diagnosis present

## 2022-10-18 HISTORY — PX: HEMORRHOID SURGERY: SHX153

## 2022-10-18 SURGERY — HEMORRHOIDECTOMY
Anesthesia: General | Site: Rectum

## 2022-10-18 MED ORDER — OXYCODONE HCL 5 MG/5ML PO SOLN: 5.0000 mg | Freq: Once | ORAL | Status: DC | PRN

## 2022-10-18 MED ORDER — VASOPRESSIN 20 UNIT/ML IV SOLN
INTRAVENOUS | Status: DC | PRN
  Administered 2022-10-18 (×3): 1 [IU] via INTRAVENOUS

## 2022-10-18 MED ORDER — GELATIN ABSORBABLE 12-7 MM EX MISC
CUTANEOUS | Status: AC
  Filled 2022-10-18: qty 1

## 2022-10-18 MED ORDER — LACTATED RINGERS IV SOLN: INTRAVENOUS | Status: DC

## 2022-10-18 MED ORDER — PROMETHAZINE HCL 25 MG/ML IJ SOLN: 6.2500 mg | INTRAMUSCULAR | Status: DC | PRN

## 2022-10-18 MED ORDER — CEFAZOLIN SODIUM-DEXTROSE 2-4 GM/100ML-% IV SOLN
INTRAVENOUS | Status: AC
  Filled 2022-10-18: qty 100

## 2022-10-18 MED ORDER — LIDOCAINE HCL (CARDIAC) PF 100 MG/5ML IV SOSY
PREFILLED_SYRINGE | INTRAVENOUS | Status: DC | PRN
  Administered 2022-10-18: 100 mg via INTRAVENOUS

## 2022-10-18 MED ORDER — GELATIN ABSORBABLE 100 EX MISC
CUTANEOUS | Status: DC | PRN
  Administered 2022-10-18: 1

## 2022-10-18 MED ORDER — SEVOFLURANE IN SOLN
RESPIRATORY_TRACT | Status: AC
  Filled 2022-10-18: qty 250

## 2022-10-18 MED ORDER — FENTANYL CITRATE (PF) 100 MCG/2ML IJ SOLN
INTRAMUSCULAR | Status: AC
  Filled 2022-10-18: qty 2

## 2022-10-18 MED ORDER — GELATIN ABSORBABLE 100 CM EX MISC
CUTANEOUS | Status: AC
  Filled 2022-10-18: qty 1

## 2022-10-18 MED ORDER — PROPOFOL 1000 MG/100ML IV EMUL
INTRAVENOUS | Status: AC
  Filled 2022-10-18: qty 100

## 2022-10-18 MED ORDER — BUPIVACAINE LIPOSOME 1.3 % IJ SUSP
INTRAMUSCULAR | Status: AC
  Filled 2022-10-18: qty 20

## 2022-10-18 MED ORDER — FENTANYL CITRATE (PF) 100 MCG/2ML IJ SOLN
INTRAMUSCULAR | Status: DC | PRN
  Administered 2022-10-18: 50 ug via INTRAVENOUS
  Administered 2022-10-18 (×2): 25 ug via INTRAVENOUS

## 2022-10-18 MED ORDER — CHLORHEXIDINE GLUCONATE 0.12 % MT SOLN
OROMUCOSAL | Status: AC
  Administered 2022-10-18: 15 mL via OROMUCOSAL
  Filled 2022-10-18: qty 15

## 2022-10-18 MED ORDER — ORAL CARE MOUTH RINSE: 15.0000 mL | Freq: Once | OROMUCOSAL | Status: AC

## 2022-10-18 MED ORDER — 0.9 % SODIUM CHLORIDE (POUR BTL) OPTIME
TOPICAL | Status: DC | PRN
  Administered 2022-10-18: 500 mL

## 2022-10-18 MED ORDER — ACETAMINOPHEN 10 MG/ML IV SOLN: 1000.0000 mg | Freq: Once | INTRAVENOUS | Status: DC | PRN

## 2022-10-18 MED ORDER — FENTANYL CITRATE (PF) 100 MCG/2ML IJ SOLN: 25.0000 ug | INTRAMUSCULAR | Status: DC | PRN

## 2022-10-18 MED ORDER — BUPIVACAINE HCL (PF) 0.5 % IJ SOLN
INTRAMUSCULAR | Status: DC | PRN
  Administered 2022-10-18: 30 mL

## 2022-10-18 MED ORDER — OXYCODONE-ACETAMINOPHEN 5-325 MG PO TABS: 1.0000 | ORAL_TABLET | ORAL | 0 refills | Status: AC | PRN

## 2022-10-18 MED ORDER — DROPERIDOL 2.5 MG/ML IJ SOLN: 0.6250 mg | Freq: Once | INTRAMUSCULAR | Status: DC | PRN

## 2022-10-18 MED ORDER — CHLORHEXIDINE GLUCONATE 0.12 % MT SOLN: 15.0000 mL | Freq: Once | OROMUCOSAL | Status: AC

## 2022-10-18 MED ORDER — EPHEDRINE SULFATE (PRESSORS) 50 MG/ML IJ SOLN
INTRAMUSCULAR | Status: DC | PRN
  Administered 2022-10-18: 10 mg via INTRAVENOUS
  Administered 2022-10-18 (×2): 5 mg via INTRAVENOUS

## 2022-10-18 MED ORDER — BUPIVACAINE LIPOSOME 1.3 % IJ SUSP
INTRAMUSCULAR | Status: DC | PRN
  Administered 2022-10-18: 20 mL

## 2022-10-18 MED ORDER — DEXAMETHASONE SODIUM PHOSPHATE 10 MG/ML IJ SOLN
INTRAMUSCULAR | Status: DC | PRN
  Administered 2022-10-18: 10 mg via INTRAVENOUS

## 2022-10-18 MED ORDER — PROPOFOL 10 MG/ML IV BOLUS
INTRAVENOUS | Status: DC | PRN
  Administered 2022-10-18: 50 mg via INTRAVENOUS
  Administered 2022-10-18: 100 mg via INTRAVENOUS
  Administered 2022-10-18: 50 mg via INTRAVENOUS

## 2022-10-18 MED ORDER — ONDANSETRON HCL 4 MG/2ML IJ SOLN
INTRAMUSCULAR | Status: DC | PRN
  Administered 2022-10-18: 4 mg via INTRAVENOUS

## 2022-10-18 MED ORDER — CEFAZOLIN SODIUM-DEXTROSE 2-4 GM/100ML-% IV SOLN
2.0000 g | INTRAVENOUS | Status: AC
  Administered 2022-10-18: 2 g via INTRAVENOUS

## 2022-10-18 MED ORDER — OXYCODONE HCL 5 MG PO TABS: 5.0000 mg | ORAL_TABLET | Freq: Once | ORAL | Status: DC | PRN

## 2022-10-18 MED ORDER — BUPIVACAINE HCL (PF) 0.5 % IJ SOLN
INTRAMUSCULAR | Status: AC
  Filled 2022-10-18: qty 30

## 2022-10-18 SURGICAL SUPPLY — 37 items
BLADE SURG 15 STRL LF DISP TIS (BLADE) ×1 IMPLANT
BLADE SURG 15 STRL SS (BLADE) ×1
BRIEF MESH DISP 2XL (UNDERPADS AND DIAPERS) ×1 IMPLANT
DRAPE LAPAROTOMY 100X77 ABD (DRAPES) ×1 IMPLANT
DRAPE LEGGINS SURG 28X43 STRL (DRAPES) ×1 IMPLANT
DRAPE UNDER BUTTOCK W/FLU (DRAPES) ×1 IMPLANT
ELECT REM PT RETURN 9FT ADLT (ELECTROSURGICAL) ×1
ELECTRODE REM PT RTRN 9FT ADLT (ELECTROSURGICAL) ×1 IMPLANT
GAUZE 4X4 16PLY ~~LOC~~+RFID DBL (SPONGE) ×1 IMPLANT
GAUZE SPONGE 4X4 12PLY STRL (GAUZE/BANDAGES/DRESSINGS) ×1 IMPLANT
GLOVE BIO SURGEON STRL SZ 6.5 (GLOVE) ×1 IMPLANT
GLOVE BIOGEL PI IND STRL 6.5 (GLOVE) ×1 IMPLANT
GOWN STRL REUS W/ TWL LRG LVL3 (GOWN DISPOSABLE) ×2 IMPLANT
GOWN STRL REUS W/TWL LRG LVL3 (GOWN DISPOSABLE) ×3
HEMOSTAT SURGICEL 2X3 (HEMOSTASIS) IMPLANT
KIT TURNOVER CYSTO (KITS) ×1 IMPLANT
LABEL OR SOLS (LABEL) ×1 IMPLANT
MANIFOLD NEPTUNE II (INSTRUMENTS) ×1 IMPLANT
NEEDLE HYPO 22GX1.5 SAFETY (NEEDLE) ×1 IMPLANT
NS IRRIG 500ML POUR BTL (IV SOLUTION) ×1 IMPLANT
PACK BASIN MINOR ARMC (MISCELLANEOUS) ×1 IMPLANT
PAD ABD DERMACEA PRESS 5X9 (GAUZE/BANDAGES/DRESSINGS) ×1 IMPLANT
PAD PREP 24X41 OB/GYN DISP (PERSONAL CARE ITEMS) ×1 IMPLANT
SHEARS HARMONIC 9CM CVD (BLADE) IMPLANT
SOL PREP PVP 2OZ (MISCELLANEOUS)
SOLUTION PREP PVP 2OZ (MISCELLANEOUS) ×1 IMPLANT
STAPLER PROXIMATE HCS (STAPLE) IMPLANT
SURGILUBE 2OZ TUBE FLIPTOP (MISCELLANEOUS) ×1 IMPLANT
SUT ETHILON 3-0 FS-10 30 BLK (SUTURE)
SUT VIC AB 2-0 SH 27 (SUTURE) ×1
SUT VIC AB 2-0 SH 27XBRD (SUTURE) ×1 IMPLANT
SUT VIC AB 3-0 SH 27 (SUTURE) ×1
SUT VIC AB 3-0 SH 27X BRD (SUTURE) IMPLANT
SUTURE EHLN 3-0 FS-10 30 BLK (SUTURE) IMPLANT
SYR 10ML LL (SYRINGE) ×1 IMPLANT
TRAP FLUID SMOKE EVACUATOR (MISCELLANEOUS) ×1 IMPLANT
WATER STERILE IRR 500ML POUR (IV SOLUTION) ×1 IMPLANT

## 2022-10-18 NOTE — Anesthesia Preprocedure Evaluation (Addendum)
Anesthesia Evaluation  Patient identified by MRN, date of birth, ID band Patient awake    Reviewed: Allergy & Precautions, H&P , NPO status , Patient's Chart, lab work & pertinent test results  Airway Mallampati: II  TM Distance: >3 FB Neck ROM: full    Dental no notable dental hx.    Pulmonary neg pulmonary ROS   Pulmonary exam normal        Cardiovascular hypertension, Normal cardiovascular exam     Neuro/Psych  Tight posterior circulation, sees neuro   TIA (2022) negative psych ROS   GI/Hepatic Neg liver ROS,GERD  ,,  Endo/Other  Hypothyroidism   Pre-diabetes  Renal/GU      Musculoskeletal   Abdominal   Peds  Hematology negative hematology ROS (+)   Anesthesia Other Findings Past Medical History: No date: Acid reflux No date: Basal cell carcinoma     Comment:  under left eye No date: Cancer ([place])     Comment:  prostate No date: Hemorrhoids No date: Hypercholesteremia No date: Hyperlipidemia No date: Hypertension No date: Hypothyroidism No date: Pre-diabetes 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,  conjunctival inj/tear) No date: Thyroid disease     Comment:  goiter 2022: TIA (transient ischemic attack)  Past Surgical History: 2019: CATARACT EXTRACTION, BILATERAL; Bilateral 04/13/2015: COLONOSCOPY WITH PROPOFOL; N/A     Comment:  Procedure: COLONOSCOPY WITH PROPOFOL;  Surgeon: Garlan Fair, MD;  Location: [facility];  Service:               Endoscopy;  Laterality: N/A; No date: INGUINAL HERNIA REPAIR; Bilateral 08/29/1999: PROSTATECTOMY 1999: TOTAL THYROIDECTOMY  BMI    Body Mass Index: 28.97 kg/m      Reproductive/Obstetrics negative OB ROS                             Anesthesia Physical Anesthesia Plan  ASA: 3  Anesthesia Plan: General LMA and General   Post-op Pain Management: Toradol IV (intra-op)* and Ofirmev IV  (intra-op)*   Induction: Intravenous  PONV Risk Score and Plan: Dexamethasone, Ondansetron, Midazolam and Treatment may vary due to age or medical condition  Airway Management Planned: LMA  Additional Equipment:   Intra-op Plan:   Post-operative Plan:   Informed Consent:      Dental Advisory Given  Plan Discussed with: Anesthesiologist, CRNA and Surgeon  Anesthesia Plan Comments:         Anesthesia Quick Evaluation

## 2022-10-18 NOTE — Op Note (Signed)
Preoperative diagnosis: Third degree hemorrhoids.   Postoperative diagnosis: Third degree hemorrhoids.  Procedure: Anoscopy, hemorrhoidectomy.  Surgeon: Dr. Windell Moment  Anesthesia: Spinal  Wound classification: Clean Contaminated  Indications: Patient is a 82 y.o. male was found to have symptomatic hemorrhoids refractory to medical managemen.   Findings: 1. Third degree hemorrhoid. No other hemorrhoid 2. Internal and external anal sphincter identified and preserved 3. Adequate hemostasis  Description of procedure: The patient was brought to the operating room and spinal anesthesia was induced. Patient was placed in the prone jackknife position. A time-out was completed verifying correct patient, procedure, site, positioning, and implant(s) and/or special equipment prior to beginning this procedure. The buttocks were taped apart.  The perineum was prepped and draped in standard sterile fashion. Local anesthetic was injected as a perianal block. An anoscope was introduced and the three hemorrhoidal pedicles were identified. Just the right posterior hemorrhoid was enlarged. A Kelly clamp was placed near the base the pedicle near the dentate line and retracted externally to exteriorize the hemorrhoidal pedicles.  An elliptical incision was made extending from perianal skin to anorectal ring including both internal and external hemorrhoids and excising a minimum amount of anoderm. Flaps were developed on both aspects of the incision, taking care to elevate only skin and mucosa. The dilated venous mass was dissected using Ligasure device from the underlying sphincter muscle. The base was ligated with a 2-0 Vicryl figure of 8 suture. The pedicle was amputated from the base. Hemostasis was achieved using electrocautery. Following hemostasis, the skin and mucosal incisions were closed with a running lock stitch of 3-0 Vicryl on the mucosal aspect and converted to subcuticular once skin was encountered.  The anal canal was then injected with local anesthetic. A gauze pad was tucked between the gluteal folds.  The patient tolerated the procedure well and was taken to the postanesthesia care unit in stable condition.   Specimen: hemorrhoids  Complications: none  EBL: 5 mL

## 2022-10-18 NOTE — Anesthesia Postprocedure Evaluation (Signed)
Anesthesia Post Note  Patient: Robert Clarke  Procedure(s) Performed: HEMORRHOIDECTOMY (Rectum)  Patient location during evaluation: PACU Anesthesia Type: General Level of consciousness: awake and alert Pain management: pain level controlled Vital Signs Assessment: post-procedure vital signs reviewed and stable Respiratory status: spontaneous breathing, nonlabored ventilation and respiratory function stable Cardiovascular status: blood pressure returned to baseline and stable Postop Assessment: no apparent nausea or vomiting Anesthetic complications: no   There were no known notable events for this encounter.   Last Vitals:  Vitals:   10/18/22 0900 10/18/22 0915  BP: (!) 141/73 138/70  Pulse: 62 62  Resp: 15   Temp: (!) 36.1 C   SpO2: 97% 97%    Last Pain:  Vitals:   10/18/22 0915  PainSc: 0-No pain                 [place] Ouch

## 2022-10-18 NOTE — Transfer of Care (Signed)
Immediate Anesthesia Transfer of Care Note  Patient: Robert Clarke  Procedure(s) Performed: HEMORRHOIDECTOMY (Rectum)  Patient Location: PACU  Anesthesia Type:General  Level of Consciousness: awake, alert , and oriented  Airway & Oxygen Therapy: Patient Spontanous Breathing  Post-op Assessment: Report given to RN and Post -op Vital signs reviewed and stable  Post vital signs: Reviewed and stable  Last Vitals:  Vitals Value Taken Time  BP 129/64 10/18/22 0828  Temp    Pulse 65 10/18/22 0829  Resp 18 10/18/22 0829  SpO2 100 % 10/18/22 0829  Vitals shown include unvalidated device data.  Last Pain:  Vitals:   10/18/22 0622  PainSc: 0-No pain         Complications: No notable events documented.

## 2022-10-18 NOTE — Discharge Instructions (Addendum)
  Diet: Resume home heart healthy high fiber diet.   Activity: No heavy lifting >20 pounds (children, pets, laundry, garbage) or strenuous activity until follow-up, but light activity and walking are encouraged. Do not drive or drink alcohol if taking narcotic pain medications.  Wound care: May shower with soapy water and pat dry (do not rub incisions), but no baths or submerging incision underwater until follow-up. (no swimming)   Medications: Resume all home medications. For mild to moderate pain: acetaminophen (Tylenol) or ibuprofen (if no kidney disease). Combining Tylenol with alcohol can substantially increase your risk of causing liver disease. Narcotic pain medications, if prescribed, can be used for severe pain, though may cause nausea, constipation, and drowsiness. If you do not need the narcotic pain medication, you do not need to fill the prescription.  Call office [phone]) at any time if any questions, worsening pain, fevers/chills, bleeding, drainage from incision site, or other concerns.   AMBULATORY SURGERY  DISCHARGE INSTRUCTIONS   The drugs that you were given will stay in your system until tomorrow so for the next 24 hours you should not:  Drive an automobile Make any legal decisions Drink any alcoholic beverage   You may resume regular meals tomorrow.  Today it is better to start with liquids and gradually work up to solid foods.  You may eat anything you prefer, but it is better to start with liquids, then soup and crackers, and gradually work up to solid foods.   Please notify your doctor immediately if you have any unusual bleeding, trouble breathing, redness and pain at the surgery site, drainage, fever, or pain not relieved by medication.     Your post-operative visit with Dr.                                       is: Date:                        Time:    Please call to schedule your post-operative visit.  Additional Instructions:

## 2022-10-18 NOTE — Interval H&P Note (Signed)
History and Physical Interval Note:  10/18/2022 7:05 AM  Robert Clarke  has presented today for surgery, with the diagnosis of K64.4 K64.8 Internal and external hemorrhoids.  The various methods of treatment have been discussed with the patient and family. After consideration of risks, benefits and other options for treatment, the patient has consented to  Procedure(s): HEMORRHOIDECTOMY (N/A) as a surgical intervention.  The patient's history has been reviewed, patient examined, no change in status, stable for surgery.  I have reviewed the patient's chart and labs.  Questions were answered to the patient's satisfaction.     Herbert Pun

## 2022-10-18 NOTE — Anesthesia Procedure Notes (Signed)
Procedure Name: LMA Insertion Date/Time: 10/18/2022 8:04 AM  Performed by: Patience Musca., CRNAPre-anesthesia Checklist: Patient identified, Patient being monitored, Timeout performed, Emergency Drugs available and Suction available Patient Re-evaluated:Patient Re-evaluated prior to induction Oxygen Delivery Method: Circle system utilized Preoxygenation: Pre-oxygenation with 100% oxygen Induction Type: IV induction Ventilation: Mask ventilation without difficulty LMA: LMA inserted LMA Size: 4.0 Tube type: Oral Number of attempts: 1 Placement Confirmation: positive ETCO2 and breath sounds checked- equal and bilateral Tube secured with: Tape Dental Injury: Teeth and Oropharynx as per pre-operative assessment

## 2022-10-19 ENCOUNTER — Encounter: Payer: Self-pay | Admitting: General Surgery

## 2022-10-19 LAB — SURGICAL PATHOLOGY

## 2022-11-11 ENCOUNTER — Emergency Department: Payer: No Typology Code available for payment source

## 2022-11-11 ENCOUNTER — Encounter: Payer: Self-pay | Admitting: Emergency Medicine

## 2022-11-11 ENCOUNTER — Other Ambulatory Visit: Payer: Self-pay

## 2022-11-11 ENCOUNTER — Emergency Department
Admission: EM | Admit: 2022-11-11 | Discharge: 2022-11-11 | Disposition: A | Discharge status: Home / Self Care | Payer: No Typology Code available for payment source | Attending: Emergency Medicine | Admitting: Emergency Medicine

## 2022-11-11 DIAGNOSIS — M7989 Other specified soft tissue disorders: Secondary | ICD-10-CM | POA: Diagnosis present

## 2022-11-11 DIAGNOSIS — I10 Essential (primary) hypertension: Secondary | ICD-10-CM | POA: Diagnosis not present

## 2022-11-11 DIAGNOSIS — I8312 Varicose veins of left lower extremity with inflammation: Secondary | ICD-10-CM | POA: Diagnosis not present

## 2022-11-11 DIAGNOSIS — Z85828 Personal history of other malignant neoplasm of skin: Secondary | ICD-10-CM | POA: Insufficient documentation

## 2022-11-11 NOTE — ED Triage Notes (Signed)
Pt to ER with c/o knot to left leg with swelling.  States was seen at walk in and was told to come have it evaluated for blood clot.  Superficial spot of swelling noted along a varicose vein.  No tenderness with palpation.

## 2022-11-11 NOTE — ED Notes (Signed)
Pt to ED with wife, states had hemorrhoid surgery 3 weeks ago and today noticed "a knot" to L calf area. States is more prominent when standing. R and L calves appear normal to this RN while pt supine. No redness, tenderness or pronounced swelling noted. Pt had ultrasound already.

## 2022-11-11 NOTE — Discharge Instructions (Signed)
Wear support socks as needed.  Follow-up with vascular if you desire to have the varicose vein removed.  Follow-up with your regular doctor as needed.  Return if worsening

## 2022-11-11 NOTE — ED Provider Notes (Signed)
[facility] Provider Note    Event Date/Time   First MD Initiated Contact with Patient 11/11/22 1619     (approximate)   History   Leg Swelling   HPI  Robert Clarke is a 82 y.o. male with history of hypertension, prediabetes, hyperlipidemia, thyroid disease, basal cell carcinoma presents emergency department with concerns of a knot on the left lower leg.  Was seen in urgent care and told to come here for an ultrasound.  Patient states he had not noticed the area until he put shorts on.  States area is not painful.  No numbness or tingling      Physical Exam   Triage Vital Signs: ED Triage Vitals [11/11/22 1523]  Enc Vitals Group     BP (!) 154/73     Pulse Rate 64     Resp 18     Temp 98.4 F (36.9 C)     Temp Source Oral     SpO2 99 %     Weight 238 lb (108 kg)     Height 6\' 4"  (1.93 m)     Head Circumference      Peak Flow      Pain Score 0     Pain Loc      Pain Edu?      Excl. in [place]?     Most recent vital signs: Vitals:   11/11/22 1523  BP: (!) 154/73  Pulse: 64  Resp: 18  Temp: 98.4 F (36.9 C)  SpO2: 99%     General: Awake, no distress.   CV:  Good peripheral perfusion. regular rate and  rhythm Resp:  Normal effort.  Abd:  No distention.   Other:  Left lower leg with protruding varicose veins at the anterior, no calf tenderness, neurovascular intact   ED Results / Procedures / Treatments   Labs (all labs ordered are listed, but only abnormal results are displayed) Labs Reviewed - No data to display   EKG     RADIOLOGY  Ultrasound left lower extremity   PROCEDURES:   Procedures   MEDICATIONS ORDERED IN ED: Medications - No data to display   IMPRESSION / MDM / [place] / ED COURSE  I reviewed the triage vital signs and the nursing notes.                              Differential diagnosis includes, but is not limited to, varicose veins, DVT, cellulitis  Patient's presentation is most  consistent with acute complicated illness / injury requiring diagnostic workup.   Ultrasound left lower extremity for DVT  I did review the ultrasound report, I individually interpreted this and reviewed this as being negative for a DVT.  I did explain this finding to the patient.  He is to wear support socks.  Follow-up with vascular as desired.  Follow-up with his regular doctor if not improving to 3 days.  Return if worsening.  Did explain to him this is very consistent with varicose veins.      FINAL CLINICAL IMPRESSION(S) / ED DIAGNOSES   Final diagnoses:  Varicose veins of left lower extremity with inflammation     Rx / DC Orders   ED Discharge Orders     None        Note:  This document was prepared using Dragon voice recognition software and may include unintentional dictation errors.    Quenisha Lovins,  Linden Dolin, PA-C 11/11/22 Delight Ovens, MD 11/11/22 [phone]

## 2022-12-21 ENCOUNTER — Emergency Department (HOSPITAL_COMMUNITY)
Admission: EM | Admit: 2022-12-21 | Discharge: 2022-12-21 | Disposition: A | Discharge status: Home / Self Care | Payer: No Typology Code available for payment source | Attending: Emergency Medicine | Admitting: Emergency Medicine

## 2022-12-21 ENCOUNTER — Encounter (HOSPITAL_COMMUNITY): Payer: Self-pay

## 2022-12-21 ENCOUNTER — Emergency Department (HOSPITAL_COMMUNITY): Payer: No Typology Code available for payment source

## 2022-12-21 DIAGNOSIS — Z79899 Other long term (current) drug therapy: Secondary | ICD-10-CM | POA: Insufficient documentation

## 2022-12-21 DIAGNOSIS — I10 Essential (primary) hypertension: Secondary | ICD-10-CM | POA: Diagnosis not present

## 2022-12-21 DIAGNOSIS — R079 Chest pain, unspecified: Secondary | ICD-10-CM | POA: Diagnosis present

## 2022-12-21 DIAGNOSIS — E039 Hypothyroidism, unspecified: Secondary | ICD-10-CM | POA: Insufficient documentation

## 2022-12-21 DIAGNOSIS — Z7982 Long term (current) use of aspirin: Secondary | ICD-10-CM | POA: Insufficient documentation

## 2022-12-21 DIAGNOSIS — Z79890 Hormone replacement therapy (postmenopausal): Secondary | ICD-10-CM | POA: Diagnosis not present

## 2022-12-21 LAB — CBC
HCT: 49.1 % (ref 39.0–52.0)
Hemoglobin: 16.5 g/dL (ref 13.0–17.0)
MCH: 29.2 pg (ref 26.0–34.0)
MCHC: 33.6 g/dL (ref 30.0–36.0)
MCV: 86.9 fL (ref 80.0–100.0)
Platelets: 251 10*3/uL (ref 150–400)
RBC: 5.65 MIL/uL (ref 4.22–5.81)
RDW: 13.1 % (ref 11.5–15.5)
WBC: 9.7 10*3/uL (ref 4.0–10.5)
nRBC: 0 % (ref 0.0–0.2)

## 2022-12-21 LAB — HEPATIC FUNCTION PANEL
ALT: 14 U/L (ref 0–44)
AST: 32 U/L (ref 15–41)
Albumin: 3.9 g/dL (ref 3.5–5.0)
Alkaline Phosphatase: 80 U/L (ref 38–126)
Bilirubin, Direct: 0.4 mg/dL — ABNORMAL HIGH (ref 0.0–0.2)
Indirect Bilirubin: 1.1 mg/dL — ABNORMAL HIGH (ref 0.3–0.9)
Total Bilirubin: 1.5 mg/dL — ABNORMAL HIGH (ref 0.3–1.2)
Total Protein: 7 g/dL (ref 6.5–8.1)

## 2022-12-21 LAB — BASIC METABOLIC PANEL
Anion gap: 9 (ref 5–15)
BUN: 17 mg/dL (ref 8–23)
CO2: 28 mmol/L (ref 22–32)
Calcium: 9.2 mg/dL (ref 8.9–10.3)
Chloride: 94 mmol/L — ABNORMAL LOW (ref 98–111)
Creatinine, Ser: 1.07 mg/dL (ref 0.61–1.24)
GFR, Estimated: >60 mL/min (ref >60)
Glucose, Bld: 110 mg/dL — ABNORMAL HIGH (ref 70–99)
Potassium: 4.3 mmol/L (ref 3.5–5.1)
Sodium: 131 mmol/L — ABNORMAL LOW (ref 135–145)

## 2022-12-21 LAB — TROPONIN I (HIGH SENSITIVITY): Troponin I (High Sensitivity): 4 ng/L (ref <18)

## 2022-12-21 LAB — LIPASE, BLOOD: Lipase: 32 U/L (ref 11–51)

## 2022-12-21 MED ORDER — ALUM & MAG HYDROXIDE-SIMETH 200-200-20 MG/5ML PO SUSP
30.0000 mL | Freq: Once | ORAL | Status: AC
  Administered 2022-12-21: 30 mL via ORAL
  Filled 2022-12-21: qty 30

## 2022-12-21 MED ORDER — FAMOTIDINE 20 MG PO TABS
20.0000 mg | ORAL_TABLET | Freq: Once | ORAL | Status: AC
  Administered 2022-12-21: 20 mg via ORAL
  Filled 2022-12-21: qty 1

## 2022-12-21 NOTE — ED Triage Notes (Addendum)
Pt sent from [place]. Pt c/o what he thought was indigestion x 5 days, in his epigastric area, radiating up through his sternum.   Pt had EKG reading "Acute MI" at the [place]. Pt drove himself with wife.  Pt denies cough, SHOB, vomiting, diarrhea.

## 2022-12-21 NOTE — ED Provider Notes (Signed)
[place] EMERGENCY DEPARTMENT AT [facility] Provider Note   CSN: [id number] Arrival date & time: 12/21/22  1526     History  Chief Complaint  Patient presents with   Chest Pain    Robert Clarke is a 82 y.o. male.  Patient is a 82 year old male with a past medical history of hypertension, GERD and hypothyroidism presenting to the emergency department with chest pain.  The patient states over the last 5 days he has had on and off chest pain.  He describes it as a pressure in his epigastrium and lower chest.  He states that his not worse with exertion or eating and seems to come on randomly and will go away for about 5 hours at a time and then will come back for several hours.  He denies any associated shortness of breath, diaphoresis, nausea or vomiting.  He denies any black or bloody stools.  He was seen at the [facility] urgent care today who recommended that he come to the ER due to an abnormal EKG.  He states that he was told by cardiologist several years ago to carry a copy of his baseline EKG in his wallet as his EKG will often look like he is having a heart attack even when he is not.   Chest Pain      Home Medications Prior to Admission medications   Medication Sig Start Date End Date Taking? Authorizing Provider  amLODipine (NORVASC) 5 MG tablet Take 5 mg by mouth daily.    [provider]  ASPIRIN 81 PO Take 81 mg by mouth daily.    [provider]  azelastine (ASTELIN) 0.1 % nasal spray Place 2 sprays into both nostrils 2 (two) times daily. Use in each nostril as directed    [provider]  levothyroxine (SYNTHROID) 150 MCG tablet Take 150 mcg by mouth daily before breakfast.    [provider]  omeprazole (PRILOSEC) 40 MG capsule Take 40 mg by mouth daily.     [provider]  oxyCODONE-acetaminophen (PERCOCET) 5-325 MG tablet Take 1 tablet by mouth every 4 (four) hours as needed for severe pain. 10/18/22 10/18/23   Carolan Shiver, MD  telmisartan (MICARDIS) 80 MG tablet Take 80 mg by mouth daily.    [provider]      Allergies    Iodine, Iodine-131, Ivp dye [iodinated contrast media], and Statins    Review of Systems   Review of Systems  Cardiovascular:  Positive for chest pain.    Physical Exam Updated Vital Signs BP 122/71   Pulse (!) 53   Temp 98.3 F (36.8 C) (Oral)   Resp 12   Ht 6\' 4"  (1.93 m)   Wt 108.9 kg   SpO2 98%   BMI 29.21 kg/m  Physical Exam Vitals and nursing note reviewed.  Constitutional:      General: He is not in acute distress.    Appearance: He is well-developed.  HENT:     Head: Normocephalic and atraumatic.  Eyes:     Extraocular Movements: Extraocular movements intact.  Cardiovascular:     Rate and Rhythm: Normal rate and regular rhythm.     Pulses:          Radial pulses are 2+ on the right side and 2+ on the left side.       Dorsalis pedis pulses are 2+ on the right side and 2+ on the left side.     Heart sounds: Normal heart sounds.  Pulmonary:     Effort: Pulmonary effort is normal.     Breath sounds: Normal breath sounds.  Abdominal:     Palpations: Abdomen is soft.     Tenderness: There is no abdominal tenderness.  Musculoskeletal:        General: Normal range of motion.     Cervical back: Normal range of motion and neck supple.     Right lower leg: No edema.     Left lower leg: No edema.  Skin:    General: Skin is warm and dry.  Neurological:     General: No focal deficit present.     Mental Status: He is alert and oriented to person, place, and time.  Psychiatric:        Mood and Affect: Mood normal.     ED Results / Procedures / Treatments   Labs (all labs ordered are listed, but only abnormal results are displayed) Labs Reviewed  BASIC METABOLIC PANEL - Abnormal; Notable for the following components:      Result Value   Sodium 131 (*)    Chloride 94 (*)    Glucose, Bld 110 (*)    All other components within  normal limits  HEPATIC FUNCTION PANEL - Abnormal; Notable for the following components:   Total Bilirubin 1.5 (*)    Bilirubin, Direct 0.4 (*)    Indirect Bilirubin 1.1 (*)    All other components within normal limits  CBC  LIPASE, BLOOD  TROPONIN I (HIGH SENSITIVITY)    EKG EKG Interpretation  Date/Time:  Thursday December 21 2022 15:37:58 EDT Ventricular Rate:  65 PR Interval:  162 QRS Duration: 116 QT Interval:  410 QTC Calculation: 426 R Axis:   95 Text Interpretation: Normal sinus rhythm Rightward axis Borderline ECG No significant change since last tracing Confirmed by Elayne Snare ([id number]) on 12/21/2022 4:17:04 PM  Radiology DG Chest 2 View  Result Date: 12/21/2022 CLINICAL DATA:  Chest pain EXAM: CHEST - 2 VIEW COMPARISON:  None Available. FINDINGS: There is some linear opacity left lung base likely scar or atelectasis. No consolidation, pneumothorax or effusion. No edema. Normal cardiopericardial silhouette. Calcified aorta. Syndesmophytes seen along the spine. Slight elevation of the left hemidiaphragm. IMPRESSION: Slight elevation of the left hemidiaphragm. Chronic interstitial changes. Electronically Signed   By: Karen Kays M.D.   On: 12/21/2022 16:17    Procedures Procedures    Medications Ordered in ED Medications  famotidine (PEPCID) tablet 20 mg (20 mg Oral Given 12/21/22 1653)  alum & mag hydroxide-simeth (MAALOX/MYLANTA) 200-200-20 MG/5ML suspension 30 mL (30 mLs Oral Given 12/21/22 1653)    ED Course/ Medical Decision Making/ A&P Clinical Course as of 12/21/22 1756  Thu Dec 21, 2022  1652 Troponin negative. Chest pain has been ongoing for several days so single troponin is sufficient. [VK]  1738 Mild bili elevation, labs otherwise within normal range. [VK]  1751 Reported some improvement of symptoms with GI cocktail.  Due to patient's age and risk factors, he will be recommended outpatient cardiology follow-up and was given strict return precautions. [VK]     Clinical Course User Index [VK] Rexford Maus, DO                             Medical Decision Making This patient presents to the ED with chief complaint(s) of chest pain with pertinent past medical history of HTN, GERD, hypothyroid which further complicates the presenting complaint. The complaint  involves an extensive differential diagnosis and also carries with it a high risk of complications and morbidity.    The differential diagnosis includes ACS, arrhythmia, anemia, pneumonia, pneumothorax, pulmonary edema, pleural effusion, gastritis, GERD, PUD, pancreatitis, hepatitis  Additional history obtained: Additional history obtained from spouse Records reviewed Care Everywhere/[facility], outpatient E[facility]  ED Course and Reassessment: On patient's arrival to the emergency department he was initially evaluated by triage and had EKG and labs ordered.  EKG on arrival here showed normal sinus rhythm without acute ischemic changes compared to prior EKG.  The patient will have labs including troponin, LFTs and lipase.  He will be placed on telemetry.  The patient would like to try GI cocktail to see if he has improvement of his symptoms.  He will be closely reassessed.  Independent labs interpretation:  The following labs were independently interpreted: within normal range  Independent visualization of imaging: - I independently visualized the following imaging with scope of interpretation limited to determining acute life threatening conditions related to emergency care: Chest x-ray, which revealed no acute disease  Consultation: - Consulted or discussed management/test interpretation w/ external professional: N/A  Consideration for admission or further workup: Patient has no emergent conditions requiring admission or further work-up at this time and is stable for discharge home with primary care and cardiology follow-up  Social  Determinants of health: N/A    Amount and/or Complexity of Data Reviewed Labs: ordered. Radiology: ordered.  Risk OTC drugs.          Final Clinical Impression(s) / ED Diagnoses Final diagnoses:  Nonspecific chest pain    Rx / DC Orders ED Discharge Orders          Ordered    Ambulatory referral to Cardiology       Comments: If you have not heard from the Cardiology office within the next 72 hours please call [phone].   12/21/22 1752              Rexford Maus, DO 12/21/22 1756

## 2022-12-21 NOTE — Discharge Instructions (Signed)
You were seen in the emergency department for your chest pain.  Your workup here showed no signs of heart attack and no abnormalities with your liver or kidney function.  Your symptoms could be related to your acid reflux and you can take an extra Pepcid or Maalox over-the-counter when you are having symptoms however you do have some risk factors for heart disease so I have given you a cardiologist to follow-up with.  You can also follow-up with your primary doctor to have your symptoms rechecked.  You should return to the emergency department if your pains getting significantly worse, you have severe shortness of breath, you pass out or if you have any other new or concerning symptoms.

## 2022-12-25 ENCOUNTER — Other Ambulatory Visit: Payer: Self-pay

## 2022-12-25 ENCOUNTER — Emergency Department: Payer: No Typology Code available for payment source

## 2022-12-25 ENCOUNTER — Emergency Department
Admission: EM | Admit: 2022-12-25 | Discharge: 2022-12-25 | Disposition: A | Discharge status: Home / Self Care | Payer: No Typology Code available for payment source | Attending: Student in an Organized Health Care Education/Training Program | Admitting: Student in an Organized Health Care Education/Training Program

## 2022-12-25 DIAGNOSIS — R1013 Epigastric pain: Secondary | ICD-10-CM

## 2022-12-25 DIAGNOSIS — K8020 Calculus of gallbladder without cholecystitis without obstruction: Secondary | ICD-10-CM | POA: Diagnosis not present

## 2022-12-25 LAB — HEPATIC FUNCTION PANEL
ALT: 12 U/L (ref 0–44)
AST: 21 U/L (ref 15–41)
Albumin: 4 g/dL (ref 3.5–5.0)
Alkaline Phosphatase: 84 U/L (ref 38–126)
Bilirubin, Direct: 0.1 mg/dL (ref 0.0–0.2)
Indirect Bilirubin: 1.2 mg/dL — ABNORMAL HIGH (ref 0.3–0.9)
Total Bilirubin: 1.3 mg/dL — ABNORMAL HIGH (ref 0.3–1.2)
Total Protein: 7 g/dL (ref 6.5–8.1)

## 2022-12-25 LAB — CBC
HCT: 47.3 % (ref 39.0–52.0)
Hemoglobin: 15.6 g/dL (ref 13.0–17.0)
MCH: 28.5 pg (ref 26.0–34.0)
MCHC: 33 g/dL (ref 30.0–36.0)
MCV: 86.5 fL (ref 80.0–100.0)
Platelets: 219 10*3/uL (ref 150–400)
RBC: 5.47 MIL/uL (ref 4.22–5.81)
RDW: 12.8 % (ref 11.5–15.5)
WBC: 8 10*3/uL (ref 4.0–10.5)
nRBC: 0 % (ref 0.0–0.2)

## 2022-12-25 LAB — BASIC METABOLIC PANEL
Anion gap: 10 (ref 5–15)
BUN: 18 mg/dL (ref 8–23)
CO2: 27 mmol/L (ref 22–32)
Calcium: 9.1 mg/dL (ref 8.9–10.3)
Chloride: 95 mmol/L — ABNORMAL LOW (ref 98–111)
Creatinine, Ser: 0.86 mg/dL (ref 0.61–1.24)
GFR, Estimated: >60 mL/min (ref >60)
Glucose, Bld: 89 mg/dL (ref 70–99)
Potassium: 3.4 mmol/L — ABNORMAL LOW (ref 3.5–5.1)
Sodium: 132 mmol/L — ABNORMAL LOW (ref 135–145)

## 2022-12-25 LAB — TROPONIN I (HIGH SENSITIVITY)
Troponin I (High Sensitivity): 6 ng/L (ref <18)
Troponin I (High Sensitivity): 5 ng/L (ref <18)

## 2022-12-25 LAB — LIPASE, BLOOD: Lipase: 27 U/L (ref 11–51)

## 2022-12-25 MED ORDER — PANTOPRAZOLE SODIUM 40 MG PO TBEC: 40.0000 mg | DELAYED_RELEASE_TABLET | Freq: Every day | ORAL | 1 refills | Status: DC

## 2022-12-25 MED ORDER — ALUM & MAG HYDROXIDE-SIMETH 200-200-20 MG/5ML PO SUSP
30.0000 mL | Freq: Once | ORAL | Status: AC
  Administered 2022-12-25: 30 mL via ORAL
  Filled 2022-12-25: qty 30

## 2022-12-25 NOTE — ED Triage Notes (Signed)
[facility] called about pt stating epigastric pain x2 weeks, seen at [facility] on 4/25, but would like possible ultrasound or CT scan.

## 2022-12-25 NOTE — ED Notes (Signed)
ED Provider at bedside. 

## 2022-12-25 NOTE — ED Provider Notes (Signed)
[facility] Provider Note    Event Date/Time   First MD Initiated Contact with Patient 12/25/22 1707     (approximate)   History   Chest Pain and Abdominal Pain   HPI  Robert Clarke is a 82 y.o. male no significant past cardiac history presents to the ER for evaluation of 2 weeks of progressively worsening mid epigastric pain.  Was seen at [facility] and then ER in [place] had cardiac workup which was negative.  Had follow-up with GI doctor today and was encouraged to come to the ER for further evaluation and imaging.  He is moving his bowels.  No fevers no nausea or vomiting.  Does have a history of reflux but feels that this is different.     Physical Exam   Triage Vital Signs: ED Triage Vitals  Enc Vitals Group     BP 12/25/22 1442 (!) 147/70     Pulse Rate 12/25/22 1440 (!) 57     Resp 12/25/22 1440 16     Temp 12/25/22 1440 98.3 F (36.8 C)     Temp src --      SpO2 12/25/22 1440 99 %     Weight 12/25/22 1441 234 lb (106.1 kg)     Height 12/25/22 1441 6\' 4"  (1.93 m)     Head Circumference --      Peak Flow --      Pain Score 12/25/22 1441 9     Pain Loc --      Pain Edu? --      Excl. in GC? --     Most recent vital signs: Vitals:   12/25/22 1930 12/25/22 2000  BP: 117/74 123/70  Pulse: (!) 56 61  Resp:    Temp:    SpO2: 96% 96%     Constitutional: Alert  Eyes: Conjunctivae are normal.  Head: Atraumatic. Nose: No congestion/rhinnorhea. Mouth/Throat: Mucous membranes are moist.   Neck: Painless ROM.  Cardiovascular:   Good peripheral circulation. Respiratory: Normal respiratory effort.  No retractions.  Gastrointestinal: Soft and nontender.  Musculoskeletal:  no deformity Neurologic:  MAE spontaneously. No gross focal neurologic deficits are appreciated.  Skin:  Skin is warm, dry and intact. No rash noted. Psychiatric: Mood and affect are normal. Speech and behavior are normal.    ED Results / Procedures / Treatments    Labs (all labs ordered are listed, but only abnormal results are displayed) Labs Reviewed  BASIC METABOLIC PANEL - Abnormal; Notable for the following components:      Result Value   Sodium 132 (*)    Potassium 3.4 (*)    Chloride 95 (*)    All other components within normal limits  HEPATIC FUNCTION PANEL - Abnormal; Notable for the following components:   Total Bilirubin 1.3 (*)    Indirect Bilirubin 1.2 (*)    All other components within normal limits  CBC  LIPASE, BLOOD  TROPONIN I (HIGH SENSITIVITY)  TROPONIN I (HIGH SENSITIVITY)     EKG  ED ECG REPORT I, Willy Eddy, the attending physician, personally viewed and interpreted this ECG.   Date: 12/25/2022  EKG Time: 14:44  Rate: 55  Rhythm: sinus  Axis: normal  Intervals: normal  ST&T Change: no stemi, no depressions    RADIOLOGY Please see ED Course for my review and interpretation.  I personally reviewed all radiographic images ordered to evaluate for the above acute complaints and reviewed radiology reports and findings.  These findings were personally  discussed with the patient.  Please see medical record for radiology report.    PROCEDURES:  Critical Care performed: no  Procedures   MEDICATIONS ORDERED IN ED: Medications  alum & mag hydroxide-simeth (MAALOX/MYLANTA) 200-200-20 MG/5ML suspension 30 mL (30 mLs Oral Given 12/25/22 1944)     IMPRESSION / MDM / ASSESSMENT AND PLAN / ED COURSE  I reviewed the triage vital signs and the nursing notes.                              Differential diagnosis includes, but is not limited to, gastritis, hiatal hernia, cholelithiasis, cholecystitis, pancreatitis, mass, acs  Patient presenting to the ER for evaluation of symptoms as described above.  Based on symptoms, risk factors and considered above differential, this presenting complaint could reflect a potentially life-threatening illness therefore the patient will be placed on continuous pulse  oximetry and telemetry for monitoring.  Laboratory evaluation will be sent to evaluate for the above complaints.  CT imaging will be ordered for the blood differential.   Clinical Course as of 12/25/22 2031  Mon Dec 25, 2022  1943 Ultrasound with evidence of cholelithiasis without evidence of cholecystitis.  Given duration of symptoms have low suspicion for infection.  He is primarily complaining of epigastric pain therefore we will try a GI cocktail.  Does not seem consistent with ACS.  Anticipate patient will be appropriate for outpatient follow-up. [PR]  2006 Patient with improvement after GI cocktail.  Discussed findings of cholelithiasis but I think based on his presentation and history that his symptoms are most clinically consistent with gastritis possible esophagitis.  Will place on Protonix.  Have encouraged follow-up with GI.  Discussed return precautions. [PR]    Clinical Course User Index [PR] Willy Eddy, MD     FINAL CLINICAL IMPRESSION(S) / ED DIAGNOSES   Final diagnoses:  Epigastric pain     Rx / DC Orders   ED Discharge Orders          Ordered    pantoprazole (PROTONIX) 40 MG tablet  Daily        12/25/22 2006             Note:  This document was prepared using Dragon voice recognition software and may include unintentional dictation errors.    Willy Eddy, MD 12/25/22 2031

## 2022-12-25 NOTE — ED Triage Notes (Signed)
Pt to ED for upper abd and chest pain for past couple weeks. Denies n/v/shob

## 2023-02-07 ENCOUNTER — Encounter: Payer: Self-pay | Admitting: Gastroenterology

## 2023-02-15 ENCOUNTER — Encounter: Payer: Self-pay | Admitting: Gastroenterology

## 2023-02-15 NOTE — H&P (Signed)
Pre-Procedure H&P   Patient ID: Robert Clarke is a 82 y.o. male.  Gastroenterology Provider: Jaynie Collins, DO  Referring Provider: Fransico Setters, NP PCP: Lauro Regulus, MD  Date: 02/16/2023  HPI Robert Clarke is a 82 y.o. male who presents today for Esophagogastroduodenoscopy for Epigastric pain, heartburn .  Patient was seen in April for epigastric discomfort and heartburn.  He notes that this has been ongoing for some time.  CT in February demonstrated enteritis and dilated esophagus.  He had significant pain during his office visit was sent to the emergency department and cardiac workup was negative. The pain is not associated with exertion or postprandial.  Pain is random and varying can last for several hours. 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   Thyroid disease    goiter   TIA (transient ischemic attack) 2022    Past Surgical History:  Procedure Laterality Date   CATARACT EXTRACTION, BILATERAL Bilateral 2019   COLONOSCOPY WITH PROPOFOL N/A 04/13/2015   Procedure: COLONOSCOPY WITH PROPOFOL;  Surgeon: Charolett Bumpers, MD;  Location: [facility];  Service: Endoscopy;  Laterality: N/A;   EYE SURGERY     HEMORRHOID SURGERY N/A 10/18/2022   Procedure: HEMORRHOIDECTOMY;  Surgeon: Carolan Shiver, MD;  Location: [facility] ORS;  Service: General;  Laterality: N/A;   HERNIA REPAIR     INGUINAL HERNIA REPAIR Bilateral    PROSTATECTOMY  08/29/1999   SKIN CANCER EXCISION     TOTAL THYROIDECTOMY  1999    Family History Father-colon  cancer No other h/o GI disease or malignancy  Review of Systems  Constitutional:  Negative for activity change, appetite change, chills, diaphoresis, fatigue, fever and unexpected weight change.  HENT:  Negative for trouble swallowing and voice change.   Respiratory:  Negative for shortness of breath and wheezing.   Cardiovascular:  Positive for chest pain. Negative for palpitations and leg swelling.  Gastrointestinal:  Negative for abdominal distention, abdominal pain, anal bleeding, blood in stool, constipation, diarrhea, nausea and vomiting.       Positive reflux  Musculoskeletal:  Negative for arthralgias and myalgias.  Skin:  Negative for color change and pallor.  Neurological:  Negative for dizziness, syncope and weakness.  Psychiatric/Behavioral:  Negative for confusion. The patient is not nervous/anxious.   All other systems reviewed and are negative.    Medications No current facility-administered medications on file prior to encounter.   Current Outpatient Medications on File Prior to Encounter  Medication Sig Dispense Refill   levothyroxine (SYNTHROID) 150 MCG tablet Take 150 mcg by mouth daily before breakfast.     pantoprazole (PROTONIX) 40 MG tablet Take 1 tablet (40 mg total) by mouth daily. 30 tablet 1   telmisartan (MICARDIS) 80 MG tablet Take 80 mg by mouth daily.     amLODipine (NORVASC) 5 MG tablet Take 5 mg by mouth daily. (Patient not taking: Reported on 02/16/2023)     ASPIRIN 81 PO Take 81 mg by mouth daily.     azelastine (ASTELIN)  0.1 % nasal spray Place 2 sprays into both nostrils 2 (two) times daily. Use in each nostril as directed     oxyCODONE-acetaminophen (PERCOCET) 5-325 MG tablet Take 1 tablet by mouth every 4 (four) hours as needed for severe pain. (Patient not taking: Reported on 02/16/2023) 20 tablet 0    Pertinent medications related to GI and procedure were reviewed by me with the patient prior to the procedure   Current Facility-Administered  Medications:    0.9 %  sodium chloride infusion, , Intravenous, Continuous, Jaynie Collins, DO, Last Rate: 20 mL/hr at 02/16/23 0981, Continued from Pre-op at 02/16/23 1914  sodium chloride 20 mL/hr at 02/16/23 7829       Allergies  Allergen Reactions   Iodine Rash   Iodine-131 Rash   Ivp Dye [Iodinated Contrast Media] Itching and Rash   Statins Other (See Comments)    Joint pain   Allergies were reviewed by me prior to the procedure  Objective   Body mass index is 28.61 kg/m. Vitals:   02/16/23 0721  BP: 114/61  Pulse: (!) 55  Resp: 17  Temp: (!) 97.5 F (36.4 C)  TempSrc: Temporal  SpO2: 100%  Weight: 106.6 kg  Height: 6\' 4"  (1.93 m)     Physical Exam Vitals and nursing note reviewed.  Constitutional:      General: He is not in acute distress.    Appearance: Normal appearance. He is not ill-appearing, toxic-appearing or diaphoretic.  HENT:     Head: Normocephalic and atraumatic.     Nose: Nose normal.     Mouth/Throat:     Mouth: Mucous membranes are moist.     Pharynx: Oropharynx is clear.  Eyes:     General: No scleral icterus.    Extraocular Movements: Extraocular movements intact.  Cardiovascular:     Rate and Rhythm: Regular rhythm. Bradycardia present.     Heart sounds: Normal heart sounds. No murmur heard.    No friction rub. No gallop.  Pulmonary:     Effort: Pulmonary effort is normal. No respiratory distress.     Breath sounds: Normal breath sounds. No wheezing, rhonchi or rales.  Abdominal:     General: Bowel sounds are normal. There is no distension.     Palpations: Abdomen is soft.     Tenderness: There is no abdominal tenderness. There is no guarding or rebound.  Musculoskeletal:     Cervical back: Neck supple.     Right lower leg: No edema.     Left lower leg: No edema.  Skin:    General: Skin is warm and dry.     Coloration: Skin is not jaundiced or pale.  Neurological:     General: No focal deficit present.     Mental  Status: He is alert and oriented to person, place, and time. Mental status is at baseline.  Psychiatric:        Mood and Affect: Mood normal.        Behavior: Behavior normal.        Thought Content: Thought content normal.        Judgment: Judgment normal.      Assessment:  [name] is a 82 y.o. male  who presents today for Esophagogastroduodenoscopy for Epigastric pain, heartburn .  Plan:  Esophagogastroduodenoscopy with possible intervention today  Esophagogastroduodenoscopy with possible biopsy, control of bleeding, polypectomy, and interventions as necessary has been discussed with the patient/patient representative. Informed consent was obtained from the patient/patient representative  after explaining the indication, nature, and risks of the procedure including but not limited to death, bleeding, perforation, missed neoplasm/lesions, cardiorespiratory compromise, and reaction to medications. Opportunity for questions was given and appropriate answers were provided. Patient/patient representative has verbalized understanding is amenable to undergoing the procedure.   Annamaria Helling, DO  [facility]  Portions of the record may have been created with voice recognition software. Occasional wrong-word or 'sound-a-like' substitutions may have occurred due to the inherent limitations of voice recognition software.  Read the chart carefully and recognize, using context, where substitutions may have occurred.

## 2023-02-16 ENCOUNTER — Encounter: Payer: Self-pay | Admitting: Gastroenterology

## 2023-02-16 ENCOUNTER — Ambulatory Visit: Payer: No Typology Code available for payment source | Admitting: Anesthesiology

## 2023-02-16 ENCOUNTER — Encounter
Admission: RE | Disposition: A | Discharge status: Home / Self Care | Payer: Self-pay | Source: Home / Self Care | Attending: Gastroenterology

## 2023-02-16 ENCOUNTER — Ambulatory Visit
Admission: RE | Admit: 2023-02-16 | Discharge: 2023-02-16 | Disposition: A | Discharge status: Home / Self Care | Payer: No Typology Code available for payment source | Attending: Gastroenterology | Admitting: Gastroenterology

## 2023-02-16 DIAGNOSIS — K3189 Other diseases of stomach and duodenum: Secondary | ICD-10-CM | POA: Insufficient documentation

## 2023-02-16 DIAGNOSIS — Z8546 Personal history of malignant neoplasm of prostate: Secondary | ICD-10-CM | POA: Insufficient documentation

## 2023-02-16 DIAGNOSIS — R1013 Epigastric pain: Secondary | ICD-10-CM | POA: Diagnosis present

## 2023-02-16 DIAGNOSIS — I10 Essential (primary) hypertension: Secondary | ICD-10-CM | POA: Diagnosis not present

## 2023-02-16 DIAGNOSIS — E7800 Pure hypercholesterolemia, unspecified: Secondary | ICD-10-CM | POA: Insufficient documentation

## 2023-02-16 DIAGNOSIS — K219 Gastro-esophageal reflux disease without esophagitis: Secondary | ICD-10-CM | POA: Insufficient documentation

## 2023-02-16 DIAGNOSIS — E785 Hyperlipidemia, unspecified: Secondary | ICD-10-CM | POA: Insufficient documentation

## 2023-02-16 DIAGNOSIS — E039 Hypothyroidism, unspecified: Secondary | ICD-10-CM | POA: Diagnosis not present

## 2023-02-16 DIAGNOSIS — K224 Dyskinesia of esophagus: Secondary | ICD-10-CM | POA: Insufficient documentation

## 2023-02-16 DIAGNOSIS — K2950 Unspecified chronic gastritis without bleeding: Secondary | ICD-10-CM | POA: Diagnosis not present

## 2023-02-16 HISTORY — PX: ESOPHAGOGASTRODUODENOSCOPY (EGD) WITH PROPOFOL: SHX5813

## 2023-02-16 HISTORY — PX: BIOPSY: SHX5522

## 2023-02-16 HISTORY — DX: Diverticulosis of intestine, part unspecified, without perforation or abscess without bleeding: K57.90

## 2023-02-16 SURGERY — ESOPHAGOGASTRODUODENOSCOPY (EGD) WITH PROPOFOL
Anesthesia: General

## 2023-02-16 MED ORDER — SODIUM CHLORIDE 0.9 % IV SOLN: INTRAVENOUS | Status: DC

## 2023-02-16 MED ORDER — PROPOFOL 500 MG/50ML IV EMUL
INTRAVENOUS | Status: DC | PRN
  Administered 2023-02-16: 165 ug/kg/min via INTRAVENOUS

## 2023-02-16 MED ORDER — PROPOFOL 10 MG/ML IV BOLUS
INTRAVENOUS | Status: DC | PRN
  Administered 2023-02-16: 20 mg via INTRAVENOUS
  Administered 2023-02-16: 40 mg via INTRAVENOUS
  Administered 2023-02-16 (×2): 20 mg via INTRAVENOUS

## 2023-02-16 MED ORDER — GLYCOPYRROLATE 0.2 MG/ML IJ SOLN
INTRAMUSCULAR | Status: DC | PRN
  Administered 2023-02-16: .2 mg via INTRAVENOUS

## 2023-02-16 MED ORDER — LIDOCAINE HCL (CARDIAC) PF 100 MG/5ML IV SOSY
PREFILLED_SYRINGE | INTRAVENOUS | Status: DC | PRN
  Administered 2023-02-16: 40 mg via INTRAVENOUS

## 2023-02-16 NOTE — Op Note (Signed)
[facility] Gastroenterology Patient Name: Robert Clarke Procedure Date: 02/16/2023 8:39 AM MRN: [id number] Account #: [id number] Date of Birth: 01-Jul-1941 Admit Type: Outpatient Age: 82 Room: [facility] ENDO ROOM 1 Gender: Male Note Status: Supervisor Override Instrument Name: Upper Endoscope [id number] Procedure:             Upper GI endoscopy Indications:           Epigastric abdominal pain, Heartburn Providers:             Trenda Moots, DO Referring MD:          Marya Amsler. Dareen Piano MD, MD (Referring MD) Medicines:             Monitored Anesthesia Care Complications:         No immediate complications. Estimated blood loss:                         Minimal. Procedure:             Pre-Anesthesia Assessment:                        - Prior to the procedure, a History and Physical was                         performed, and patient medications and allergies were                         reviewed. The patient is competent. The risks and                         benefits of the procedure and the sedation options and                         risks were discussed with the patient. All questions                         were answered and informed consent was obtained.                         Patient identification and proposed procedure were                         verified by the physician, the nurse, the anesthetist                         and the technician in the endoscopy suite. Mental                         Status Examination: alert and oriented. Airway                         Examination: normal oropharyngeal airway and neck                         mobility. Respiratory Examination: clear to                         auscultation. CV Examination: bradycardia noted.  Prophylactic Antibiotics: The patient does not require                         prophylactic antibiotics. Prior Anticoagulants: The                         patient has taken no  anticoagulant or antiplatelet                         agents. ASA Grade Assessment: III - A patient with                         severe systemic disease. After reviewing the risks and                         benefits, the patient was deemed in satisfactory                         condition to undergo the procedure. The anesthesia                         plan was to use monitored anesthesia care (MAC).                         Immediately prior to administration of medications,                         the patient was re-assessed for adequacy to receive                         sedatives. The heart rate, respiratory rate, oxygen                         saturations, blood pressure, adequacy of pulmonary                         ventilation, and response to care were monitored                         throughout the procedure. The physical status of the                         patient was re-assessed after the procedure.                        After obtaining informed consent, the endoscope was                         passed under direct vision. Throughout the procedure,                         the patient's blood pressure, pulse, and oxygen                         saturations were monitored continuously. The Endoscope                         was introduced through the mouth, and advanced to the  second part of duodenum. The upper GI endoscopy was                         accomplished without difficulty. The patient tolerated                         the procedure well. Findings:      Localized granular mucosa was found in the duodenal bulb. Biopsies were       taken with a cold forceps for histology. Estimated blood loss was       minimal.      The exam of the duodenum was otherwise normal.      The entire examined stomach was normal. Biopsies were taken with a cold       forceps for Helicobacter pylori testing. Estimated blood loss was       minimal.      The Z-line was  regular. Estimated blood loss: none.      Esophagogastric landmarks were identified: the gastroesophageal junction       was found at 37 cm from the incisors.      Normal mucosa was found in the entire esophagus. Estimated blood loss:       none.      Abnormal motility was noted in the esophagus. The cricopharyngeus was       normal. There is spasticity of the esophageal body. The distal       esophagus/lower esophageal sphincter is open. Tertiary peristaltic waves       are noted. Estimated blood loss: none.      The exam of the esophagus was otherwise normal. Impression:            - Granular mucosa in the duodenal bulb. Biopsied.                        - Normal stomach. Biopsied.                        - Z-line regular.                        - Esophagogastric landmarks identified.                        - Normal mucosa was found in the entire esophagus.                        - Abnormal esophageal motility, suspicious for                         presbyesophagus. Recommendation:        - Patient has a contact number available for                         emergencies. The signs and symptoms of potential                         delayed complications were discussed with the patient.                         Return to normal activities tomorrow. Written  discharge instructions were provided to the patient.                        - Discharge patient to home.                        - Resume previous diet.                        - Continue present medications.                        - Await pathology results.                        - Return to GI clinic as previously scheduled.                        - If chest pain symptoms persist, consider motility                         and manometry study.                        Request colonoscopy records from Dr. Christella Noa office                         to assess if repeat surveillance is needed despite age.                         - The findings and recommendations were discussed with                         the patient. Procedure Code(s):     --- Professional ---                        [phone], Esophagogastroduodenoscopy, flexible,                         transoral; with biopsy, single or multiple Diagnosis Code(s):     --- Professional ---                        K31.89, Other diseases of stomach and duodenum                        K22.4, Dyskinesia of esophagus                        R07.89, Other chest pain CPT copyright 2022 American Medical Association. All rights reserved. The codes documented in this report are preliminary and upon coder review may  be revised to meet current compliance requirements. Attending Participation:      I personally performed the entire procedure. Elfredia Nevins, DO Jaynie Collins DO, DO 02/16/2023 9:02:36 AM This report has been signed electronically. Number of Addenda: 0 Note Initiated On: 02/16/2023 8:39 AM Estimated Blood Loss:  Estimated blood loss was minimal.      [facility]

## 2023-02-16 NOTE — Transfer of Care (Signed)
Immediate Anesthesia Transfer of Care Note  Patient: Robert Clarke  Procedure(s) Performed: ESOPHAGOGASTRODUODENOSCOPY (EGD) WITH PROPOFOL  Patient Location: Endoscopy Unit  Anesthesia Type:General  Level of Consciousness: drowsy and patient cooperative  Airway & Oxygen Therapy: Patient Spontanous Breathing and Patient connected to face mask oxygen  Post-op Assessment: Report given to RN and Post -op Vital signs reviewed and stable  Post vital signs: Reviewed and stable  Last Vitals:  Vitals Value Taken Time  BP    Temp    Pulse    Resp    SpO2      Last Pain:  Vitals:   02/16/23 0721  TempSrc: Temporal  PainSc: 0-No pain         Complications: No notable events documented.

## 2023-02-16 NOTE — Anesthesia Procedure Notes (Signed)
Procedure Name: General with mask airway Date/Time: 02/16/2023 8:51 AM  Performed by: Mohammed Kindle, CRNAPre-anesthesia Checklist: Patient identified, Emergency Drugs available, Suction available and Patient being monitored Patient Re-evaluated:Patient Re-evaluated prior to induction Oxygen Delivery Method: Simple face mask Induction Type: IV induction Placement Confirmation: positive ETCO2, CO2 detector and breath sounds checked- equal and bilateral Dental Injury: Teeth and Oropharynx as per pre-operative assessment

## 2023-02-16 NOTE — Interval H&P Note (Signed)
History and Physical Interval Note: Preprocedure H&P from 02/16/23  was reviewed and there was no interval change after seeing and examining the patient.  Written consent was obtained from the patient after discussion of risks, benefits, and alternatives. Patient has consented to proceed with Esophagogastroduodenoscopy with possible intervention   02/16/2023 8:38 AM  Robert Clarke  has presented today for surgery, with the diagnosis of 789.06 (ICD-9-CM) - R10.13 (ICD-10-CM) - Epigastric pain 787.1 (ICD-9-CM) - R12 (ICD-10-CM) - Chronic heartburn.  The various methods of treatment have been discussed with the patient and family. After consideration of risks, benefits and other options for treatment, the patient has consented to  Procedure(s): ESOPHAGOGASTRODUODENOSCOPY (EGD) WITH PROPOFOL (N/A) as a surgical intervention.  The patient's history has been reviewed, patient examined, no change in status, stable for surgery.  I have reviewed the patient's chart and labs.  Questions were answered to the patient's satisfaction.     Jaynie Collins

## 2023-02-16 NOTE — Anesthesia Preprocedure Evaluation (Signed)
Anesthesia Evaluation  Patient identified by MRN, date of birth, ID band Patient awake    Reviewed: Allergy & Precautions, NPO status , Patient's Chart, lab work & pertinent test results  History of Anesthesia Complications (+) PONV, PROLONGED EMERGENCE and history of anesthetic complications  Airway Mallampati: III  TM Distance: <3 FB Neck ROM: full    Dental  (+) Upper Dentures, Lower Dentures   Pulmonary neg pulmonary ROS, neg shortness of breath   Pulmonary exam normal        Cardiovascular Exercise Tolerance: Good hypertension, (-) angina (-) Past MI Normal cardiovascular exam     Neuro/Psych  Headaches TIA negative psych ROS   GI/Hepatic Neg liver ROS,GERD  Controlled,,  Endo/Other  Hypothyroidism    Renal/GU negative Renal ROS  negative genitourinary   Musculoskeletal   Abdominal   Peds  Hematology negative hematology ROS (+)   Anesthesia Other Findings Past Medical History: No date: Acid reflux No date: Basal cell carcinoma     Comment:  under left eye No date: Cancer (HCC)     Comment:  prostate No date: Diverticulosis No date: Hemorrhoids No date: Hypercholesteremia No date: Hyperlipidemia No date: Hypertension No date: Hypothyroidism No date: Pre-diabetes 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,  conjunctival inj/tear) No date: Thyroid disease     Comment:  goiter 2022: TIA (transient ischemic attack)  Past Surgical History: 2019: CATARACT EXTRACTION, BILATERAL; Bilateral 04/13/2015: COLONOSCOPY WITH PROPOFOL; N/A     Comment:  Procedure: COLONOSCOPY WITH PROPOFOL;  Surgeon: Charolett Bumpers, MD;  Location: [facility];  Service:               Endoscopy;  Laterality: N/A; No date: EYE SURGERY 10/18/2022: HEMORRHOID SURGERY; N/A     Comment:  Procedure: HEMORRHOIDECTOMY;  Surgeon: Carolan Shiver, MD;  Location: [facility] ORS;  Service:  General;                Laterality: N/A; No date: HERNIA REPAIR No date: INGUINAL HERNIA REPAIR; Bilateral 08/29/1999: PROSTATECTOMY No date: SKIN CANCER EXCISION 1999: TOTAL THYROIDECTOMY  BMI    Body Mass Index: 28.61 kg/m      Reproductive/Obstetrics negative OB ROS                             Anesthesia Physical Anesthesia Plan  ASA: 3  Anesthesia Plan: General   Post-op Pain Management:    Induction: Intravenous  PONV Risk Score and Plan: Propofol infusion and TIVA  Airway Management Planned: Natural Airway and Nasal Cannula  Additional Equipment:   Intra-op Plan:   Post-operative Plan:   Informed Consent: I have reviewed the patients History and Physical, chart, labs and discussed the procedure including the risks, benefits and alternatives for the proposed anesthesia with the patient or authorized representative who has indicated his/her understanding and acceptance.     Dental Advisory Given  Plan Discussed with: Anesthesiologist, CRNA and Surgeon  Anesthesia Plan Comments: (Patient consented for risks of anesthesia including but not limited to:  - adverse reactions to medications - risk of airway placement if required - damage to eyes, teeth, lips or other oral mucosa - nerve damage due to positioning  - sore throat or hoarseness - Damage to heart, brain, nerves, lungs, other parts of body or loss of life  Patient voiced understanding.)       Anesthesia Quick Evaluation

## 2023-02-16 NOTE — Anesthesia Postprocedure Evaluation (Signed)
Anesthesia Post Note  Patient: Robert Clarke  Procedure(s) Performed: ESOPHAGOGASTRODUODENOSCOPY (EGD) WITH PROPOFOL  Patient location during evaluation: Endoscopy Anesthesia Type: General Level of consciousness: awake and alert Pain management: pain level controlled Vital Signs Assessment: post-procedure vital signs reviewed and stable Respiratory status: spontaneous breathing, nonlabored ventilation, respiratory function stable and patient connected to nasal cannula oxygen Cardiovascular status: blood pressure returned to baseline and stable Postop Assessment: no apparent nausea or vomiting Anesthetic complications: no   No notable events documented.   Last Vitals:  Vitals:   02/16/23 0913 02/16/23 0923  BP:  124/76  Pulse: 62 (!) 52  Resp: 14 16  Temp:    SpO2: 99% 100%    Last Pain:  Vitals:   02/16/23 0923  TempSrc:   PainSc: 0-No pain                 Cleda Mccreedy Hoang Reich

## 2023-02-19 ENCOUNTER — Encounter: Payer: Self-pay | Admitting: Gastroenterology

## 2023-07-25 ENCOUNTER — Encounter: Payer: Self-pay | Admitting: Gastroenterology

## 2023-08-29 NOTE — H&P (Signed)
 Pre-Procedure H&P   Patient ID: Robert Clarke is a 83 y.o. male.  Gastroenterology Provider: [name], DO  Referring Provider: Luke Barefoot, NP PCP: Lenon Layman ORN, MD  Date: 08/30/2023  HPI Robert Clarke is a 83 y.o. male who presents today for Colonoscopy for Personal history of colon polyps; family history of colon cancer .  Patient last underwent colonoscopy in 2021 with multiple adenomatous for polyps removed (13).  Internal hemorrhoids were also noted.  Colonoscopy in 2016 demonstrated pedunculated polyp over 1 cm.  Pathology not available.  Father with colon cancer at age 24. Sister with colon cancer in her 78s  Reports regular bowel movements without melena or hematochezia.  CT performed with sigmoid diverticulosis. Status post prostate removal. Sodium 132 creatinine 1.0 hemoglobin 15.6 MCV 86.5 platelets 219,000   Past Medical History:  Diagnosis Date   Acid reflux    Basal cell carcinoma    under left eye   Basal cell carcinoma of nose    Cancer (HCC)    prostate   Diverticulosis    Hemorrhoids    History of adenomatous polyp of colon    Hypercholesteremia    Hyperlipidemia    Hypertension    Hypothyroidism    Pre-diabetes    SUNCT (short unilateral neuralgiform headache, conjunctival inj/tear) 09/12/2018   Thyroid  disease    goiter   TIA (transient ischemic attack) 2022    Past Surgical History:  Procedure Laterality Date   BIOPSY  02/16/2023   Procedure: BIOPSY;  Surgeon: Jungling Elspeth Ozell, DO;  Location: [facility] ENDOSCOPY;  Service: Gastroenterology;;   CARTILAGE SURGERY Right    EAR   CATARACT EXTRACTION, BILATERAL Bilateral 2019   COLONOSCOPY WITH PROPOFOL  N/A 04/13/2015   Procedure: COLONOSCOPY WITH PROPOFOL ;  Surgeon: Gladis MARLA Louder, MD;  Location: [facility] ENDOSCOPY;  Service: Endoscopy;  Laterality: N/A;   ESOPHAGOGASTRODUODENOSCOPY (EGD) WITH PROPOFOL  N/A 02/16/2023   Procedure: ESOPHAGOGASTRODUODENOSCOPY (EGD) WITH  PROPOFOL ;  Surgeon: Jungling Elspeth Ozell, DO;  Location: [facility] ENDOSCOPY;  Service: Gastroenterology;  Laterality: N/A;   EYE SURGERY     HEMORRHOID SURGERY N/A 10/18/2022   Procedure: HEMORRHOIDECTOMY;  Surgeon: Rodolph Romano, MD;  Location: [facility] ORS;  Service: General;  Laterality: N/A;   HERNIA REPAIR     INGUINAL HERNIA REPAIR Bilateral    PROSTATECTOMY  08/29/1999   SKIN CANCER EXCISION     TOTAL THYROIDECTOMY  1999    Family History Father-colon cancer at age 17. Sister with colon cancer in her 3s No h/o GI disease or malignancy  Review of Systems  Constitutional:  Negative for activity change, appetite change, chills, diaphoresis, fatigue, fever and unexpected weight change.  HENT:  Negative for trouble swallowing and voice change.   Respiratory:  Negative for shortness of breath and wheezing.   Cardiovascular:  Negative for chest pain, palpitations and leg swelling.  Gastrointestinal:  Negative for abdominal distention, abdominal pain, anal bleeding, blood in stool, constipation, diarrhea, nausea and vomiting.  Musculoskeletal:  Negative for arthralgias and myalgias.  Skin:  Negative for color change and pallor.  Neurological:  Negative for dizziness, syncope and weakness.  Psychiatric/Behavioral:  Negative for confusion. The patient is not nervous/anxious.   All other systems reviewed and are negative.    Medications No current facility-administered medications on file prior to encounter.   Current Outpatient Medications on File Prior to Encounter  Medication Sig Dispense Refill   ASPIRIN  81 PO Take 81 mg by mouth daily.     esomeprazole (NEXIUM)  10 MG packet Take 40 mg by mouth daily before breakfast.     fluticasone (FLONASE) 50 MCG/ACT nasal spray Place 1 spray into both nostrils daily.     levothyroxine  (SYNTHROID ) 150 MCG tablet Take 150 mcg by mouth daily before breakfast.     telmisartan  (MICARDIS ) 80 MG tablet Take 80 mg by mouth daily.     amLODipine  (NORVASC) 5 MG tablet Take 5 mg by mouth daily. (Patient not taking: Reported on 02/16/2023)     azelastine  (ASTELIN ) 0.1 % nasal spray Place 2 sprays into both nostrils 2 (two) times daily. Use in each nostril as directed     omeprazole (PRILOSEC) 40 MG capsule Take 40 mg by mouth daily. (Patient not taking: Reported on 08/30/2023)     oxyCODONE -acetaminophen  (PERCOCET) 5-325 MG tablet Take 1 tablet by mouth every 4 (four) hours as needed for severe pain. (Patient not taking: Reported on 02/16/2023) 20 tablet 0   pantoprazole  (PROTONIX ) 40 MG tablet Take 1 tablet (40 mg total) by mouth daily. (Patient not taking: Reported on 08/30/2023) 30 tablet 1    Pertinent medications related to GI and procedure were reviewed by me with the patient prior to the procedure   Current Facility-Administered Medications:    0.9 %  sodium chloride  infusion, , Intravenous, Continuous, Onita Elspeth Sharper, DO, Last Rate: 20 mL/hr at 08/30/23 0724, New Bag at 08/30/23 0724  sodium chloride  20 mL/hr at 08/30/23 9275       Allergies  Allergen Reactions   Iodine  Rash   Iodine -131 Rash   Ivp Dye [Iodinated Contrast Media] Itching and Rash   Statins Other (See Comments)    Joint pain   Allergies were reviewed by me prior to the procedure  Objective   Body mass index is 29.09 kg/m. Vitals:   08/30/23 0714  BP: 131/78  Pulse: 64  Resp: 15  Temp: (!) 96.4 F (35.8 C)  TempSrc: Temporal  SpO2: 100%  Weight: 108.4 kg  Height: 6' 4 (1.93 m)     Physical Exam Vitals and nursing note reviewed.  Constitutional:      General: He is not in acute distress.    Appearance: Normal appearance. He is not ill-appearing, toxic-appearing or diaphoretic.  HENT:     Head: Normocephalic and atraumatic.     Nose: Nose normal.     Mouth/Throat:     Mouth: Mucous membranes are moist.     Pharynx: Oropharynx is clear.  Eyes:     General: No scleral icterus.    Extraocular Movements: Extraocular movements intact.   Cardiovascular:     Rate and Rhythm: Normal rate and regular rhythm.     Heart sounds: Normal heart sounds. No murmur heard.    No friction rub. No gallop.  Pulmonary:     Effort: Pulmonary effort is normal. No respiratory distress.     Breath sounds: Normal breath sounds. No wheezing, rhonchi or rales.  Abdominal:     General: Bowel sounds are normal. There is no distension.     Palpations: Abdomen is soft.     Tenderness: There is no abdominal tenderness. There is no guarding or rebound.  Musculoskeletal:     Cervical back: Neck supple.     Right lower leg: No edema.     Left lower leg: No edema.  Skin:    General: Skin is warm and dry.     Coloration: Skin is not jaundiced or pale.  Neurological:     General: No  focal deficit present.     Mental Status: He is alert and oriented to person, place, and time. Mental status is at baseline.  Psychiatric:        Mood and Affect: Mood normal.        Behavior: Behavior normal.        Thought Content: Thought content normal.        Judgment: Judgment normal.      Assessment:  Mr. Shiraz Holifield is a 83 y.o. male  who presents today for Colonoscopy for Personal history of colon polyps; family history of colon cancer .  Plan:  Colonoscopy with possible intervention today  Colonoscopy with possible biopsy, control of bleeding, polypectomy, and interventions as necessary has been discussed with the patient/patient representative. Informed consent was obtained from the patient/patient representative after explaining the indication, nature, and risks of the procedure including but not limited to death, bleeding, perforation, missed neoplasm/lesions, cardiorespiratory compromise, and reaction to medications. Opportunity for questions was given and appropriate answers were provided. Patient/patient representative has verbalized understanding is amenable to undergoing the procedure.   [name], DO  [facility]  Gastroenterology  Portions of the record may have been created with voice recognition software. Occasional wrong-word or 'sound-a-like' substitutions may have occurred due to the inherent limitations of voice recognition software.  Read the chart carefully and recognize, using context, where substitutions may have occurred.

## 2023-08-30 ENCOUNTER — Ambulatory Visit
Admission: RE | Admit: 2023-08-30 | Discharge: 2023-08-30 | Disposition: A | Discharge status: Home / Self Care | Payer: Medicare HMO | Attending: Gastroenterology | Admitting: Gastroenterology

## 2023-08-30 ENCOUNTER — Ambulatory Visit: Payer: Medicare HMO | Admitting: Anesthesiology

## 2023-08-30 ENCOUNTER — Encounter: Payer: Self-pay | Admitting: Gastroenterology

## 2023-08-30 ENCOUNTER — Encounter
Admission: RE | Disposition: A | Discharge status: Home / Self Care | Payer: Self-pay | Source: Home / Self Care | Attending: Gastroenterology

## 2023-08-30 DIAGNOSIS — K5730 Diverticulosis of large intestine without perforation or abscess without bleeding: Secondary | ICD-10-CM | POA: Diagnosis not present

## 2023-08-30 DIAGNOSIS — D123 Benign neoplasm of transverse colon: Secondary | ICD-10-CM | POA: Diagnosis not present

## 2023-08-30 DIAGNOSIS — K219 Gastro-esophageal reflux disease without esophagitis: Secondary | ICD-10-CM | POA: Diagnosis not present

## 2023-08-30 DIAGNOSIS — Z1211 Encounter for screening for malignant neoplasm of colon: Secondary | ICD-10-CM | POA: Insufficient documentation

## 2023-08-30 DIAGNOSIS — K640 First degree hemorrhoids: Secondary | ICD-10-CM | POA: Insufficient documentation

## 2023-08-30 DIAGNOSIS — Z8673 Personal history of transient ischemic attack (TIA), and cerebral infarction without residual deficits: Secondary | ICD-10-CM | POA: Insufficient documentation

## 2023-08-30 DIAGNOSIS — I10 Essential (primary) hypertension: Secondary | ICD-10-CM | POA: Diagnosis not present

## 2023-08-30 DIAGNOSIS — D122 Benign neoplasm of ascending colon: Secondary | ICD-10-CM | POA: Diagnosis not present

## 2023-08-30 DIAGNOSIS — Z800 Family history of malignant neoplasm of digestive organs: Secondary | ICD-10-CM | POA: Diagnosis not present

## 2023-08-30 HISTORY — PX: POLYPECTOMY: SHX5525

## 2023-08-30 HISTORY — PX: COLONOSCOPY WITH PROPOFOL: SHX5780

## 2023-08-30 HISTORY — DX: Basal cell carcinoma of skin of nose: C44.311

## 2023-08-30 HISTORY — DX: Personal history of adenomatous and serrated colon polyps: Z86.0101

## 2023-08-30 SURGERY — COLONOSCOPY WITH PROPOFOL
Anesthesia: General

## 2023-08-30 MED ORDER — PROPOFOL 10 MG/ML IV BOLUS
INTRAVENOUS | Status: DC | PRN
  Administered 2023-08-30: 100 mg via INTRAVENOUS

## 2023-08-30 MED ORDER — EPHEDRINE SULFATE-NACL 50-0.9 MG/10ML-% IV SOSY
PREFILLED_SYRINGE | INTRAVENOUS | Status: DC | PRN
  Administered 2023-08-30 (×4): 10 mg via INTRAVENOUS
  Administered 2023-08-30: 5 mg via INTRAVENOUS

## 2023-08-30 MED ORDER — PROPOFOL 500 MG/50ML IV EMUL
INTRAVENOUS | Status: DC | PRN
  Administered 2023-08-30: 120 ug/kg/min via INTRAVENOUS

## 2023-08-30 MED ORDER — PROPOFOL 1000 MG/100ML IV EMUL
INTRAVENOUS | Status: AC
  Filled 2023-08-30: qty 100

## 2023-08-30 MED ORDER — EPHEDRINE 5 MG/ML INJ
INTRAVENOUS | Status: AC
  Filled 2023-08-30: qty 5

## 2023-08-30 MED ORDER — SODIUM CHLORIDE 0.9 % IV SOLN: INTRAVENOUS | Status: DC

## 2023-08-30 NOTE — Op Note (Signed)
 [facility] Gastroenterology Patient Name: Robert Clarke Procedure Date: 08/30/2023 7:10 AM MRN: [id number] Account #: [id number] Date of Birth: 03-Feb-1941 Admit Type: Outpatient Age: 83 Room: [facility] ENDO ROOM 1 Gender: Male Note Status: Finalized Instrument Name: Arvis [id number] Procedure:             Colonoscopy Indications:           High risk colon cancer surveillance: Personal history                         of colonic polyps, Family history of colon cancer in                         multiple first-degree relatives Providers:             Elspeth Ozell Onita ROSALEA, DO Referring MD:          Elspeth Ozell Onita DO, DO (Referring MD), Layman MICAEL Piety MD, MD (Referring MD) Medicines:             Monitored Anesthesia Care Complications:         No immediate complications. Estimated blood loss:                         Minimal. Procedure:             Pre-Anesthesia Assessment:                        - Prior to the procedure, a History and Physical was                         performed, and patient medications and allergies were                         reviewed. The patient is competent. The risks and                         benefits of the procedure and the sedation options and                         risks were discussed with the patient. All questions                         were answered and informed consent was obtained.                         Patient identification and proposed procedure were                         verified by the physician, the nurse, the anesthetist                         and the technician in the endoscopy suite. Mental                         Status Examination: alert and oriented. Airway  Examination: normal oropharyngeal airway and neck                         mobility. Respiratory Examination: clear to                         auscultation. CV Examination: RRR, no murmurs, no S3                          or S4. Prophylactic Antibiotics: The patient does not                         require prophylactic antibiotics. Prior                         Anticoagulants: The patient has taken no anticoagulant                         or antiplatelet agents. ASA Grade Assessment: II - A                         patient with mild systemic disease. After reviewing                         the risks and benefits, the patient was deemed in                         satisfactory condition to undergo the procedure. The                         anesthesia plan was to use monitored anesthesia care                         (MAC). Immediately prior to administration of                         medications, the patient was re-assessed for adequacy                         to receive sedatives. The heart rate, respiratory                         rate, oxygen saturations, blood pressure, adequacy of                         pulmonary ventilation, and response to care were                         monitored throughout the procedure. The physical                         status of the patient was re-assessed after the                         procedure.                        After obtaining informed consent, the colonoscope was  passed under direct vision. Throughout the procedure,                         the patient's blood pressure, pulse, and oxygen                         saturations were monitored continuously. The                         Colonoscope was introduced through the anus and                         advanced to the the cecum, identified by appendiceal                         orifice and ileocecal valve. The colonoscopy was                         technically difficult and complex due to a redundant                         colon, significant looping and the patient's body                         habitus. Successful completion of the procedure was                         aided  by straightening and shortening the scope to                         obtain bowel loop reduction, using scope torsion,                         applying abdominal pressure and lavage. The patient                         tolerated the procedure well. The quality of the bowel                         preparation was evaluated using the BBPS [facility] Bowel                         Preparation Scale) with scores of: Right Colon = 2                         (minor amount of residual staining, small fragments of                         stool and/or opaque liquid, but mucosa seen well),                         Transverse Colon = 3 (entire mucosa seen well with no                         residual staining, small fragments of stool or opaque  liquid) and Left Colon = 3 (entire mucosa seen well                         with no residual staining, small fragments of stool or                         opaque liquid). The total BBPS score equals 8. The                         quality of the bowel preparation was excellent. The                         ileocecal valve, appendiceal orifice, and rectum were                         photographed. Findings:      The perianal and digital rectal examinations were normal. Pertinent       negatives include normal sphincter tone.      Multiple small-mouthed diverticula were found in the left colon.       Estimated blood loss: none.      Non-bleeding internal hemorrhoids were found during retroflexion. The       hemorrhoids were Grade I (internal hemorrhoids that do not prolapse).       Estimated blood loss: none.      Two sessile polyps were found in the transverse colon and ascending       colon. The polyps were 1 to 3 mm in size. These polyps were removed with       a jumbo cold forceps. Resection and retrieval were complete. Estimated       blood loss was minimal.      The exam was otherwise without abnormality on direct and retroflexion        views. Impression:            - Diverticulosis in the left colon.                        - Non-bleeding internal hemorrhoids.                        - Two 1 to 3 mm polyps in the transverse colon and in                         the ascending colon, removed with a jumbo cold                         forceps. Resected and retrieved.                        - The examination was otherwise normal on direct and                         retroflexion views. Recommendation:        - Patient has a contact number available for                         emergencies. The signs and symptoms of potential  delayed complications were discussed with the patient.                         Return to normal activities tomorrow. Written                         discharge instructions were provided to the patient.                        - Discharge patient to home.                        - Resume previous diet.                        - Continue present medications.                        - Await pathology results.                        - Return to referring physician as previously                         scheduled.                        - The findings and recommendations were discussed with                         the patient. Procedure Code(s):     --- Professional ---                        [phone], Colonoscopy, flexible; with biopsy, single or                         multiple Diagnosis Code(s):     --- Professional ---                        Z86.010, Personal history of colonic polyps                        K64.0, First degree hemorrhoids                        D12.3, Benign neoplasm of transverse colon (hepatic                         flexure or splenic flexure)                        D12.2, Benign neoplasm of ascending colon                        Z80.0, Family history of malignant neoplasm of                         digestive organs                        K57.30, Diverticulosis of large  intestine without  perforation or abscess without bleeding CPT copyright 2022 [employer]. All rights reserved. The codes documented in this report are preliminary and upon coder review may  be revised to meet current compliance requirements. Attending Participation:      I personally performed the entire procedure. Elspeth Jungling, DO [name] DO, DO 08/30/2023 8:29:50 AM This report has been signed electronically. Number of Addenda: 0 Note Initiated On: 08/30/2023 7:10 AM Scope Withdrawal Time: 0 hours 16 minutes 15 seconds  Total Procedure Duration: 0 hours 40 minutes 11 seconds  Estimated Blood Loss:  Estimated blood loss was minimal.      [facility]

## 2023-08-30 NOTE — Transfer of Care (Signed)
 Immediate Anesthesia Transfer of Care Note  Patient: Robert Clarke  Procedure(s) Performed: COLONOSCOPY WITH PROPOFOL  POLYPECTOMY  Patient Location: PACU  Anesthesia Type:General  Level of Consciousness: awake and sedated  Airway & Oxygen Therapy: Patient Spontanous Breathing and Patient connected to face mask oxygen  Post-op Assessment: Report given to RN and Post -op Vital signs reviewed and stable  Post vital signs: Reviewed and stable  Last Vitals:  Vitals Value Taken Time  BP    Temp    Pulse    Resp    SpO2      Last Pain:  Vitals:   08/30/23 0714  TempSrc: Temporal         Complications: There were no known notable events for this encounter.

## 2023-08-30 NOTE — Anesthesia Preprocedure Evaluation (Addendum)
 Anesthesia Evaluation  Patient identified by MRN, date of birth, ID band Patient awake    Reviewed: Allergy & Precautions, NPO status , Patient's Chart, lab work & pertinent test results  History of Anesthesia Complications Negative for: history of anesthetic complications  Airway Mallampati: IV  TM Distance: <3 FB Neck ROM: Full    Dental  (+) Upper Dentures, Partial Lower   Pulmonary neg pulmonary ROS, neg sleep apnea, neg COPD, Patient abstained from smoking.Not current smoker   Pulmonary exam normal breath sounds clear to auscultation       Cardiovascular Exercise Tolerance: Good METShypertension, Pt. on medications (-) CAD and (-) Past MI (-) dysrhythmias  Rhythm:Regular Rate:Normal - Systolic murmurs 7975 normal stress echo   Neuro/Psych  Headaches TIA negative psych ROS   GI/Hepatic ,GERD  Medicated and Controlled,,(+)     (-) substance abuse    Endo/Other  neg diabetesHypothyroidism    Renal/GU negative Renal ROS     Musculoskeletal   Abdominal   Peds  Hematology   Anesthesia Other Findings Past Medical History: No date: Acid reflux No date: Basal cell carcinoma     Comment:  under left eye No date: Basal cell carcinoma of nose No date: Cancer (HCC)     Comment:  prostate No date: Diverticulosis No date: Hemorrhoids No date: History of adenomatous polyp of colon No date: Hypercholesteremia No date: Hyperlipidemia No date: Hypertension No date: Hypothyroidism No date: Pre-diabetes 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,  conjunctival inj/tear) No date: Thyroid  disease     Comment:  goiter 2022: TIA (transient ischemic attack)  Reproductive/Obstetrics                             Anesthesia Physical Anesthesia Plan  ASA: 2  Anesthesia Plan: General   Post-op Pain Management: Minimal or no pain anticipated   Induction: Intravenous  PONV Risk  Score and Plan: 2 and Propofol  infusion, TIVA and Ondansetron   Airway Management Planned: Nasal Cannula  Additional Equipment: None  Intra-op Plan:   Post-operative Plan:   Informed Consent: I have reviewed the patients History and Physical, chart, labs and discussed the procedure including the risks, benefits and alternatives for the proposed anesthesia with the patient or authorized representative who has indicated his/her understanding and acceptance.     Dental advisory given  Plan Discussed with: CRNA and Surgeon  Anesthesia Plan Comments: (Discussed risks of anesthesia with patient, including possibility of difficulty with spontaneous ventilation under anesthesia necessitating airway intervention, PONV, and rare risks such as cardiac or respiratory or neurological events, and allergic reactions. Discussed the role of CRNA in patient's perioperative care. Patient understands.)       Anesthesia Quick Evaluation

## 2023-08-30 NOTE — Interval H&P Note (Signed)
 History and Physical Interval Note: Preprocedure H&P from 08/30/23  was reviewed and there was no interval change after seeing and examining the patient.  Written consent was obtained from the patient after discussion of risks, benefits, and alternatives. Patient has consented to proceed with Colonoscopy with possible intervention   08/30/2023 7:36 AM  Robert Clarke  has presented today for surgery, with the diagnosis of Z86.0100 (ICD-10-CM) - History of colon polyps.  The various methods of treatment have been discussed with the patient and family. After consideration of risks, benefits and other options for treatment, the patient has consented to  Procedure(s): COLONOSCOPY WITH PROPOFOL  (N/A) as a surgical intervention.  The patient's history has been reviewed, patient examined, no change in status, stable for surgery.  I have reviewed the patient's chart and labs.  Questions were answered to the patient's satisfaction.     [name]

## 2023-08-30 NOTE — Anesthesia Postprocedure Evaluation (Signed)
 Anesthesia Post Note  Patient: Robert Clarke  Procedure(s) Performed: COLONOSCOPY WITH PROPOFOL  POLYPECTOMY  Patient location during evaluation: Endoscopy Anesthesia Type: General Level of consciousness: awake and alert Pain management: pain level controlled Vital Signs Assessment: post-procedure vital signs reviewed and stable Respiratory status: spontaneous breathing, nonlabored ventilation, respiratory function stable and patient connected to nasal cannula oxygen Cardiovascular status: blood pressure returned to baseline and stable Postop Assessment: no apparent nausea or vomiting Anesthetic complications: no   There were no known notable events for this encounter.   Last Vitals:  Vitals:   08/30/23 0714 08/30/23 0833  BP: 131/78 95/63  Pulse: 64   Resp: 15   Temp: (!) 35.8 C   SpO2: 100%     Last Pain:  Vitals:   08/30/23 0714  TempSrc: Temporal                 Rome Ade

## 2023-08-31 ENCOUNTER — Encounter: Payer: Self-pay | Admitting: Gastroenterology

## 2023-08-31 LAB — SURGICAL PATHOLOGY

## 2023-12-28 ENCOUNTER — Emergency Department (HOSPITAL_COMMUNITY)

## 2023-12-28 ENCOUNTER — Encounter (HOSPITAL_COMMUNITY): Payer: Self-pay

## 2023-12-28 ENCOUNTER — Emergency Department (HOSPITAL_COMMUNITY)
Admission: EM | Admit: 2023-12-28 | Discharge: 2023-12-28 | Disposition: A | Discharge status: Home / Self Care | Attending: Emergency Medicine | Admitting: Emergency Medicine

## 2023-12-28 ENCOUNTER — Other Ambulatory Visit: Payer: Self-pay

## 2023-12-28 DIAGNOSIS — Z7982 Long term (current) use of aspirin: Secondary | ICD-10-CM | POA: Diagnosis not present

## 2023-12-28 DIAGNOSIS — M25551 Pain in right hip: Secondary | ICD-10-CM | POA: Diagnosis present

## 2023-12-28 DIAGNOSIS — W19XXXA Unspecified fall, initial encounter: Secondary | ICD-10-CM | POA: Insufficient documentation

## 2023-12-28 MED ORDER — PREDNISONE 20 MG PO TABS
60.0000 mg | ORAL_TABLET | ORAL | Status: AC
  Administered 2023-12-28: 60 mg via ORAL
  Filled 2023-12-28: qty 3

## 2023-12-28 MED ORDER — KETOROLAC TROMETHAMINE 15 MG/ML IJ SOLN
7.5000 mg | Freq: Once | INTRAMUSCULAR | Status: AC
  Administered 2023-12-28: 7.5 mg via INTRAMUSCULAR
  Filled 2023-12-28: qty 1

## 2023-12-28 MED ORDER — PREDNISONE 20 MG PO TABS: 40.0000 mg | ORAL_TABLET | Freq: Every day | ORAL | 0 refills | Status: DC

## 2023-12-28 NOTE — ED Triage Notes (Signed)
 Patient BIB EMS- Patient fell yesterday evening around 1830 while watering his grass. Patient now C/O right hip and leg pain. 8/10 pain while standing.

## 2023-12-28 NOTE — ED Notes (Signed)
 Patient transported to X-ray

## 2023-12-28 NOTE — ED Notes (Signed)
 Patient transported to CT

## 2023-12-28 NOTE — ED Provider Notes (Signed)
 [place] EMERGENCY DEPARTMENT AT [facility] Provider Note   CSN: [id number] Arrival date & time: 12/28/23  8295     History  Chief Complaint  Patient presents with   Hip Pain   Fall    Robert Clarke is a 83 y.o. male.  HPI Patient with hip and knee pain.  The patient has chronic knee issues.  Yesterday, he felt his knee give out which it has done before.  He developed knee and hip pain worsening in the area and was initially ambulatory, but after sitting for some time, and trying to get up he was unable to do so secondary to pain in the lateral hip, distal femoral region.  No distal loss of sensation, no weakness, no other injuries from the fall but no other complaints.    Home Medications Prior to Admission medications   Medication Sig Start Date End Date Taking? Authorizing Provider  predniSONE  (DELTASONE ) 20 MG tablet Take 2 tablets (40 mg total) by mouth daily with breakfast. For the next four days 12/28/23  Yes Dorenda Gandy, MD  amLODipine (NORVASC) 5 MG tablet Take 5 mg by mouth daily. Patient not taking: Reported on 02/16/2023    [provider]  ASPIRIN  81 PO Take 81 mg by mouth daily.    [provider]  azelastine (ASTELIN) 0.1 % nasal spray Place 2 sprays into both nostrils 2 (two) times daily. Use in each nostril as directed    [provider]  esomeprazole (NEXIUM) 10 MG packet Take 40 mg by mouth daily before breakfast.    [provider]  fluticasone (FLONASE) 50 MCG/ACT nasal spray Place 1 spray into both nostrils daily.    [provider]  levothyroxine  (SYNTHROID ) 150 MCG tablet Take 150 mcg by mouth daily before breakfast.    [provider]  omeprazole (PRILOSEC) 40 MG capsule Take 40 mg by mouth daily. Patient not taking: Reported on 08/30/2023    [provider]  pantoprazole  (PROTONIX ) 40 MG tablet Take 1 tablet (40 mg total) by mouth daily. Patient not taking: Reported on 08/30/2023  12/25/22 12/25/23  Suellyn Emory, MD  telmisartan (MICARDIS) 80 MG tablet Take 80 mg by mouth daily.    [provider]  telmisartan-hydrochlorothiazide (MICARDIS HCT) 80-12.5 MG tablet Take 1 tablet by mouth daily.    [provider]      Allergies    Statins and Iohexol     Review of Systems   Review of Systems  Physical Exam Updated Vital Signs BP 124/69   Pulse 70   Temp 98.5 F (36.9 C) (Oral)   Resp 16   Ht 6\' 4"  (1.93 m)   Wt 108.4 kg   SpO2 99%   BMI 29.09 kg/m  Physical Exam Vitals and nursing note reviewed.  Constitutional:      General: He is not in acute distress.    Appearance: He is well-developed.  HENT:     Head: Normocephalic and atraumatic.  Eyes:     Conjunctiva/sclera: Conjunctivae normal.  Cardiovascular:     Rate and Rhythm: Normal rate and regular rhythm.  Pulmonary:     Effort: Pulmonary effort is normal. No respiratory distress.     Breath sounds: No stridor.  Abdominal:     General: There is no distension.  Musculoskeletal:       Arms:  Skin:    General: Skin is warm and dry.  Neurological:     Mental Status: He is alert and oriented  to person, place, and time.     ED Results / Procedures / Treatments   Labs (all labs ordered are listed, but only abnormal results are displayed) Labs Reviewed - No data to display  EKG None  Radiology CT Hip Right Wo Contrast Result Date: 12/28/2023 CLINICAL DATA:  Hip trauma.  Fracture suspected. EXAM: CT OF THE RIGHT HIP WITHOUT CONTRAST TECHNIQUE: Multidetector CT imaging of the right hip was performed according to the standard protocol. Multiplanar CT image reconstructions were also generated. RADIATION DOSE REDUCTION: This exam was performed according to the departmental dose-optimization program which includes automated exposure control, adjustment of the mA and/or kV according to patient size and/or use of iterative reconstruction technique. COMPARISON:  Pelvis and right hip  radiographs 12/28/2023, CT abdomen pelvis 12/25/2022 FINDINGS: Bones/Joint/Cartilage Mildly decreased bone mineralization is similar to prior. No acute fracture is seen. Mild to moderate superior femoroacetabular joint space narrowing and superolateral acetabular degenerative osteophytosis. Minimal pubic symphysis joint space narrowing, subchondral sclerosis, superior osteophytosis. There is anterior bridging osteophytosis of the visualized portion of the right sacroiliac joint. A 12 mm sclerotic focus within the right ilium is unchanged from 12/25/2022 CT, compatible with a bone island. Ligaments Suboptimally assessed by CT. Muscles and Tendons Normal size and density of the regional musculature. No gross tendon tear is seen. Soft tissues Surgical clips are seen within the prostate bed status post prostatectomy. IMPRESSION: 1. No acute fracture is seen. 2. 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 Electronically Signed   By: Bertina Broccoli M.D.   On: 12/28/2023 09:38   DG Knee Complete 4 Views Right Result Date: 12/28/2023 CLINICAL DATA:  fall. EXAM: RIGHT KNEE - COMPLETE 4+ VIEW COMPARISON:  None Available. FINDINGS: No acute fracture or dislocation. No aggressive osseous lesion. There are degenerative changes of the knee joint in the form of moderately reduced tibiofemoral compartment joint space, along with tibial spiking and tricompartmental osteophytosis. No knee effusion or focal soft tissue swelling. No radiopaque foreign bodies. IMPRESSION: 1. No acute osseous abnormality of the right knee joint. 2. Moderate tricompartmental degenerative joint disease, as described above. Electronically Signed   By: Beula Brunswick M.D.   On: 12/28/2023 08:22   DG Hip Unilat With Pelvis 2-3 Views Right Result Date: 12/28/2023 CLINICAL DATA:  Fall. EXAM: DG HIP (WITH OR WITHOUT PELVIS) 2-3V RIGHT COMPARISON:  None Available. FINDINGS: Pelvis is intact with normal and symmetric sacroiliac joints. No acute  fracture or dislocation. No aggressive osseous lesion. Visualized sacral arcuate lines are unremarkable. Unremarkable symphysis pubis. There are mild degenerative changes of bilateral hip joints without significant joint space narrowing. Osteophytosis of the superior acetabulum. Multiple surgical staples noted overlying the lower midline pelvis. No radiopaque foreign bodies. IMPRESSION: No acute osseous abnormality of the pelvis or right hip joint. Electronically Signed   By: Beula Brunswick M.D.   On: 12/28/2023 08:21    Procedures Procedures    Medications Ordered in ED Medications  ketorolac  (TORADOL ) 15 MG/ML injection 7.5 mg (has no administration in time range)  predniSONE  (DELTASONE ) tablet 60 mg (has no administration in time range)    ED Course/ Medical Decision Making/ A&P                                 Medical Decision Making Adult male with acknowledged arthritic changes in his knee presents with hip and knee pain following a fall that occurred after giving out sensation.  No other injuries, complaints or evidence for trauma is reassuring.  Concern for soft tissue injury versus fracture, hip, knee. Distal neurovascular status is preserved which is reassuring, x-rays ordered,  Amount and/or Complexity of Data Reviewed Independent Historian: EMS External Data Reviewed: notes. Radiology: ordered and independent interpretation performed. Decision-making details documented in ED Course.  Risk Decision regarding hospitalization.  Patient accompanied by his wife 10:55 AM On repeat exam patient in no distress, awake, alert, hemodynamically unremarkable.  CT performed after x-ray not show obvious fracture, but the patient continued pain was also reassuring, no evidence for fracture.  He, his wife and I had a lengthy conversation with today's evaluation, findings, no evidence for fracture, suspicion for musculoskeletal pain, and we discussed his ongoing sensation of right knee giving  out.  He has discussed this with his physician previously as well.  Patient is scheduled to go to [place] for a cruise in 3 days.  Had lengthy conversation about that, patient will take cane, will start anti-inflammatories will follow-up with orthopedics on return.  Absent fracture, compromise distal neurovascular status, without complaints superior to the right leg patient appropriate for discharge with his wife.        Final Clinical Impression(s) / ED Diagnoses Final diagnoses:  Fall, initial encounter  Right hip pain    Rx / DC Orders ED Discharge Orders          Ordered    predniSONE  (DELTASONE ) 20 MG tablet  Daily with breakfast        12/28/23 1054              Dorenda Gandy, MD 12/28/23 1055

## 2023-12-28 NOTE — Discharge Instructions (Addendum)
 As discussed, your evaluation today has been largely reassuring.  But, it is important that you monitor your condition carefully, and do not hesitate to return to the ED if you develop new, or concerning changes in your condition.  Have a great trip to [place].  Please continue your prescribed steroids to completion.  Please use a cane for additional support on the trip.  Be sure to follow-up with your orthopedist when you return.

## 2024-01-13 ENCOUNTER — Ambulatory Visit (INDEPENDENT_AMBULATORY_CARE_PROVIDER_SITE_OTHER)
Admit: 2024-01-13 | Discharge: 2024-01-13 | Disposition: A | Discharge status: Home / Self Care | Attending: Family Medicine | Admitting: Family Medicine

## 2024-01-13 ENCOUNTER — Encounter (HOSPITAL_BASED_OUTPATIENT_CLINIC_OR_DEPARTMENT_OTHER): Payer: Self-pay

## 2024-01-13 ENCOUNTER — Ambulatory Visit (HOSPITAL_BASED_OUTPATIENT_CLINIC_OR_DEPARTMENT_OTHER)
Admission: RE | Admit: 2024-01-13 | Discharge: 2024-01-13 | Disposition: A | Discharge status: Home / Self Care | Source: Ambulatory Visit

## 2024-01-13 VITALS — BP 172/99 | HR 72 | Temp 98.3°F | Resp 20

## 2024-01-13 DIAGNOSIS — M79651 Pain in right thigh: Secondary | ICD-10-CM | POA: Diagnosis not present

## 2024-01-13 DIAGNOSIS — R609 Edema, unspecified: Secondary | ICD-10-CM | POA: Diagnosis not present

## 2024-01-13 DIAGNOSIS — M25551 Pain in right hip: Secondary | ICD-10-CM

## 2024-01-13 MED ORDER — DICLOFENAC SODIUM 75 MG PO TBEC: DELAYED_RELEASE_TABLET | ORAL | 0 refills | Status: AC

## 2024-01-13 MED ORDER — TRIAMCINOLONE ACETONIDE 40 MG/ML IJ SUSP
40.0000 mg | Freq: Once | INTRAMUSCULAR | Status: AC
  Administered 2024-01-13: 40 mg via INTRAMUSCULAR

## 2024-01-13 NOTE — ED Provider Notes (Signed)
 [facility]    CSN: [id number] Arrival date & time: 01/13/24  1156      History   Chief Complaint No chief complaint on file.   HPI Robert Clarke is a 83 y.o. male.   Patient fell on 12/28/23.  He went to the emergency room at [facility] and had actually an x-ray and a CT scan.  On 12/28/2023 he had a ketorolac  injection in the emergency room.  He was provided prednisone , 20 mg, 2 pills daily for 4 days which was 40 mg a day for 4 days.  Those medications helped initially but his pain never completely resolved.  He had to walk a lot on his cruise and he has had persistent right hip pain that radiates down the right thigh he has some pain in the buttock and posterior thigh but he has a moderate amount of pain mid thigh.  He has trouble lifting his right leg when he is sitting and has to manually lift his leg up sometimes.  Due to flights they did not get back from [place] until today, 01/13/2024 at 2 AM.  He is having trouble walking and using a cane and wanted his hip and thigh reevaluated.     Past Medical History:  Diagnosis Date   Acid reflux    Basal cell carcinoma    under left eye   Basal cell carcinoma of nose    Cancer (HCC)    prostate   Diverticulosis    Hemorrhoids    History of adenomatous polyp of colon    Hypercholesteremia    Hyperlipidemia    Hypertension    Hypothyroidism    Pre-diabetes    SUNCT (short unilateral neuralgiform headache, conjunctival inj/tear) 09/12/2018   Thyroid  disease    goiter   TIA (transient ischemic attack) 2022    Patient Active Problem List   Diagnosis Date Noted   TIA (transient ischemic attack) 01/16/2019   SUNCT (short unilateral neuralgiform headache, conjunctival inj/tear) 09/12/2018   Basal cell carcinoma of nose 08/06/2014    Past Surgical History:  Procedure Laterality Date   BIOPSY  02/16/2023   Procedure: BIOPSY;  Surgeon: Quintin Buckle, DO;  Location: [facility] ENDOSCOPY;  Service: Gastroenterology;;    CARTILAGE SURGERY Right    EAR   CATARACT EXTRACTION, BILATERAL Bilateral 2019   COLONOSCOPY WITH PROPOFOL  N/A 04/13/2015   Procedure: COLONOSCOPY WITH PROPOFOL ;  Surgeon: Garrett Kallman, MD;  Location: [facility] ENDOSCOPY;  Service: Endoscopy;  Laterality: N/A;   COLONOSCOPY WITH PROPOFOL  N/A 08/30/2023   Procedure: COLONOSCOPY WITH PROPOFOL ;  Surgeon: Quintin Buckle, DO;  Location: [facility] ENDOSCOPY;  Service: Gastroenterology;  Laterality: N/A;   ESOPHAGOGASTRODUODENOSCOPY (EGD) WITH PROPOFOL  N/A 02/16/2023   Procedure: ESOPHAGOGASTRODUODENOSCOPY (EGD) WITH PROPOFOL ;  Surgeon: Quintin Buckle, DO;  Location: [facility] ENDOSCOPY;  Service: Gastroenterology;  Laterality: N/A;   EYE SURGERY     HEMORRHOID SURGERY N/A 10/18/2022   Procedure: HEMORRHOIDECTOMY;  Surgeon: Eldred Grego, MD;  Location: [facility] ORS;  Service: General;  Laterality: N/A;   HERNIA REPAIR     INGUINAL HERNIA REPAIR Bilateral    POLYPECTOMY  08/30/2023   Procedure: POLYPECTOMY;  Surgeon: Quintin Buckle, DO;  Location: [facility] ENDOSCOPY;  Service: Gastroenterology;;   PROSTATECTOMY  08/29/1999   SKIN CANCER EXCISION     TOTAL THYROIDECTOMY  1999       Home Medications    Prior to Admission medications   Medication Sig Start Date End Date Taking? Authorizing Provider  levothyroxine  (  SYNTHROID ) 150 MCG tablet Take 150 mcg by mouth daily before breakfast.   Yes [provider]  omeprazole (PRILOSEC) 40 MG capsule Take 40 mg by mouth daily.   Yes [provider]  telmisartan-hydrochlorothiazide (MICARDIS HCT) 80-12.5 MG tablet Take 1 tablet by mouth daily.   Yes [provider]  ASPIRIN  81 PO Take 81 mg by mouth daily.    [provider]  azelastine (ASTELIN) 0.1 % nasal spray Place 2 sprays into both nostrils 2 (two) times daily. Use in each nostril as directed    [provider]  diclofenac  (VOLTAREN ) 75 MG EC tablet 1 pill every 12 hours with food if needed for  hip thigh or knee pain. 01/13/24   Guss Legacy, FNP  predniSONE  (DELTASONE ) 20 MG tablet Take 2 tablets (40 mg total) by mouth daily with breakfast. For the next four days 12/28/23   Dorenda Gandy, MD    Family History Family History  Problem Relation Age of Onset   Cancer - Colon Father     Social History Social History   Tobacco Use   Smoking status: Never   Smokeless tobacco: Never  Vaping Use   Vaping status: Never Used  Substance Use Topics   Alcohol use: Yes    Comment: glass wine daily   Drug use: No     Allergies   Statins and Iohexol    Review of Systems Review of Systems  Constitutional:  Negative for fever.  Respiratory:  Negative for cough.   Cardiovascular:  Negative for chest pain.  Gastrointestinal:  Negative for abdominal pain, constipation, diarrhea, nausea and vomiting.  Musculoskeletal:  Positive for arthralgias (Right hip and right leg or thigh pain) and joint swelling (Right hip). Negative for back pain.  Skin:  Negative for color change and rash.  Neurological:  Negative for syncope.  All other systems reviewed and are negative.    Physical Exam Triage Vital Signs ED Triage Vitals  Encounter Vitals Group     BP 01/13/24 1209 (!) 172/99     Systolic BP Percentile --      Diastolic BP Percentile --      Pulse Rate 01/13/24 1209 72     Resp 01/13/24 1209 20     Temp 01/13/24 1209 98.3 F (36.8 C)     Temp Source 01/13/24 1209 Oral     SpO2 01/13/24 1209 95 %     Weight --      Height --      Head Circumference --      Peak Flow --      Pain Score 01/13/24 1207 8     Pain Loc --      Pain Education --      Exclude from Growth Chart --    No data found.  Updated Vital Signs BP (!) 172/99 (BP Location: Right Arm)   Pulse 72   Temp 98.3 F (36.8 C) (Oral)   Resp 20   SpO2 95%   Visual Acuity Right Eye Distance:   Left Eye Distance:   Bilateral Distance:    Right Eye Near:   Left Eye Near:    Bilateral Near:      Physical Exam Vitals and nursing note reviewed.  Constitutional:      General: He is not in acute distress.    Appearance: He is well-developed. He is not ill-appearing or toxic-appearing.  HENT:     Head: Normocephalic and atraumatic.     Right Ear:  External ear normal.     Left Ear: External ear normal.     Nose: Nose normal.     Mouth/Throat:     Lips: Pink.     Mouth: Mucous membranes are moist.  Eyes:     Conjunctiva/sclera: Conjunctivae normal.     Pupils: Pupils are equal, round, and reactive to light.  Cardiovascular:     Rate and Rhythm: Normal rate and regular rhythm.     Heart sounds: S1 normal and S2 normal. No murmur heard. Pulmonary:     Effort: Pulmonary effort is normal. No respiratory distress.     Breath sounds: Normal breath sounds. No decreased breath sounds, wheezing, rhonchi or rales.  Musculoskeletal:        General: No swelling.     Right hip: Tenderness (Along the hip and posterior buttock) present. Decreased range of motion (Due to pain).     Left hip: Normal.     Right upper leg: Tenderness (Moderate thigh tenderness with movement of the leg especially leg extension.) present.     Left upper leg: Normal.     Right knee: Normal.     Left knee: Normal.     Right lower leg: No tenderness. 2+ Edema present.     Left lower leg: No tenderness. 2+ Edema present.     Right ankle: Swelling present.     Left ankle: Swelling present.     Comments: Wife reports the patient has had chronic swelling of his ankles and lower legs on the trip.  She does not know if it is a change in diet (a lot more salt in his diet) or if it is due to what ever is going on with his right hip and thigh or if it is due to how much he has had to be on his feet and his exercise during the trip.  Skin:    General: Skin is warm and dry.     Capillary Refill: Capillary refill takes less than 2 seconds.     Findings: No rash.  Neurological:     Mental Status: He is alert and oriented to  person, place, and time.  Psychiatric:        Mood and Affect: Mood normal.      UC Treatments / Results  Labs (all labs ordered are listed, but only abnormal results are displayed) Comprehensive Metabolic Panel (CMP): 10/25/23:  Order: [id number] Component Ref Range & Units 2 mo ago  Glucose 70 - 110 mg/dL 80  Sodium 409 - 811 mmol/L 134 Low   Potassium 3.6 - 5.1 mmol/L 4.3  Chloride 97 - 109 mmol/L 96 Low   Carbon Dioxide (CO2) 22.0 - 32.0 mmol/L 32.2 High   Urea Nitrogen (BUN) 7 - 25 mg/dL 12  Creatinine 0.7 - 1.3 mg/dL 1  Glomerular Filtration Rate (eGFR) >60 mL/min/1.73sq m 76  Comment: CKD-EPI (2021) does not include patient's race in the calculation of eGFR.  Monitoring changes of plasma creatinine and eGFR over time is useful for monitoring kidney function.  Interpretive Ranges for eGFR (CKD-EPI 2021):  eGFR:       >60 mL/min/1.73 sq. m - Normal eGFR:       30-59 mL/min/1.73 sq. m - Moderately Decreased eGFR:       15-29 mL/min/1.73 sq. m  - Severely Decreased eGFR:       < 15 mL/min/1.73 sq. m  - Kidney Failure   Note: These eGFR calculations do not apply in acute situations when  eGFR is changing rapidly or patients on dialysis.  Calcium  8.7 - 10.3 mg/dL 9.5  AST 8 - 39 U/L 19  ALT 6 - 57 U/L 11  Alk Phos (alkaline Phosphatase) 34 - 104 U/L 87  Albumin 3.5 - 4.8 g/dL 4.2  Bilirubin, Total 0.3 - 1.2 mg/dL 1.1  Protein, Total 6.1 - 7.9 g/dL 6.4  A/G Ratio 1.0 - 5.0 gm/dL 1.9  Resulting Agency [facility]    EKG   Radiology DG Hip Unilat With Pelvis 2-3 Views Right Result Date: 01/13/2024 CLINICAL DATA:  Acute right hip pain. Right thigh pain. Recent fall. EXAM: DG HIP (WITH OR WITHOUT PELVIS) 2-3V RIGHT; RIGHT FEMUR 2 VIEWS COMPARISON:  Hip radiograph and CT 12/28/2023 FINDINGS: Pelvis and right hip: The iliac crests are not entirely included in the field of view. There is no evidence of acute fracture of the pelvis or right hip. No  hip dislocation. Mild right hip osteoarthritis with subchondral cysts and acetabular spurring. The pubic rami are intact. Pubic symphysis and sacroiliac joints are congruent. Stable sclerotic focus within the ischium. Surgical clips in the pelvis. Femur: No acute femur fracture. No erosive or bony destructive change. Knee alignment is maintained with mild degenerative change. No focal bone lesion. No focal soft tissue abnormalities. IMPRESSION: 1. No acute fracture or subluxation of the pelvis, right hip, or femur. 2. Mild right hip osteoarthritis. Electronically Signed   By: Chadwick Colonel M.D.   On: 01/13/2024 13:48   DG Femur Min 2 Views Right Result Date: 01/13/2024 CLINICAL DATA:  Acute right hip pain. Right thigh pain. Recent fall. EXAM: DG HIP (WITH OR WITHOUT PELVIS) 2-3V RIGHT; RIGHT FEMUR 2 VIEWS COMPARISON:  Hip radiograph and CT 12/28/2023 FINDINGS: Pelvis and right hip: The iliac crests are not entirely included in the field of view. There is no evidence of acute fracture of the pelvis or right hip. No hip dislocation. Mild right hip osteoarthritis with subchondral cysts and acetabular spurring. The pubic rami are intact. Pubic symphysis and sacroiliac joints are congruent. Stable sclerotic focus within the ischium. Surgical clips in the pelvis. Femur: No acute femur fracture. No erosive or bony destructive change. Knee alignment is maintained with mild degenerative change. No focal bone lesion. No focal soft tissue abnormalities. IMPRESSION: 1. No acute fracture or subluxation of the pelvis, right hip, or femur. 2. Mild right hip osteoarthritis. Electronically Signed   By: Chadwick Colonel M.D.   On: 01/13/2024 13:48    12/28/23: EXAM:  CT OF THE RIGHT HIP WITHOUT CONTRAST TECHNIQUE:  Multidetector CT imaging of the right hip was performed according to the standard protocol. Multiplanar CT image reconstructions were also generated. RADIATION DOSE REDUCTION: This exam was performed according to  the departmental dose-optimization program which includes automated exposure control, adjustment of the mA and/or kV according to patient size and/or use of iterative reconstruction technique. COMPARISON:  Pelvis and right hip radiographs 12/28/2023, CT abdomen pelvis 12/25/2022 FINDINGS:  Bones/Joint/Cartilage Mildly decreased bone mineralization is similar to prior. No acute fracture is seen. Mild to moderate superior femoroacetabular joint space narrowing and superolateral acetabular degenerative osteophytosis. Minimal pubic symphysis joint space narrowing, subchondral sclerosis, superior osteophytosis. There is anterior bridging osteophytosis of the visualized portion of the right sacroiliac joint. A 12 mm sclerotic focus within the right ilium is unchanged from 12/25/2022 CT, compatible with a bone island. Ligaments Suboptimally assessed by CT. Muscles and Tendons: Normal size and density of the regional musculature. No gross tendon  tear is seen. Soft tissues Surgical clips are seen within the prostate bed status post prostatectomy. IMPRESSION:  1. No acute fracture is seen.  2. 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 Electronically Signed    By: Bertina Broccoli M.D.    On: 12/28/2023 09:38  12/28/23: EXAM:  RIGHT KNEE - COMPLETE 4+ VIEW COMPARISON:  None Available. FINDINGS:  No acute fracture or dislocation. No aggressive osseous lesion. There are degenerative changes of the knee joint in the form of moderately reduced tibiofemoral compartment joint space, along with tibial spiking and tricompartmental osteophytosis. No knee effusion or focal soft tissue swelling. No radiopaque foreign bodies. IMPRESSION:  1. No acute osseous abnormality of the right knee joint.  2. Moderate tricompartmental degenerative joint disease, as described above. Electronically Signed     By: Beula Brunswick M.D.     On: 12/28/2023 08:22  12/28/23: EXAM:  DG HIP (WITH OR WITHOUT PELVIS) 2-3V  RIGHT COMPARISON:  None Available. FINDINGS: Pelvis is intact with normal and symmetric sacroiliac joints. No acute fracture or dislocation. No aggressive osseous lesion. Visualized sacral arcuate lines are unremarkable. Unremarkable symphysis pubis. There are mild degenerative changes of bilateral hip joints without significant joint space narrowing. Osteophytosis of the superior acetabulum. Multiple surgical staples noted overlying the lower midline pelvis. No radiopaque foreign bodies. IMPRESSION:  No acute osseous abnormality of the pelvis or right hip joint. Electronically Signed    By: Beula Brunswick M.D.    On: 12/28/2023 08:21  Procedures Procedures (including critical care time)  Medications Ordered in UC Medications  triamcinolone  acetonide (KENALOG -40) injection 40 mg (40 mg Intramuscular Given 01/13/24 1351)    Initial Impression / Assessment and Plan / UC Course  I have reviewed the triage vital signs and the nursing notes.  Pertinent labs & imaging results that were available during my care of the patient were reviewed by me and considered in my medical decision making (see chart for details).  Clinical Course as of 01/13/24 1454  Sun Jan 13, 2024  1337 DG Hip Unilat With Pelvis 2-3 Views Right [SJ]  1338 DG Hip Unilat With Pelvis 2-3 Views Right [SJ]  1447 DG Femur Min 2 Views Right [SJ]    Clinical Course User Index [SJ] Guss Legacy, FNP   Plan of Care: Right hip pain, right thigh pain: I reviewed his x-rays done today and with the exception of saying arthritis in the right hip they were normal.  I personally reviewed the films and reports of his x-rays done on 12/28/2023.  I also reviewed lab work done on 10/25/2023.  His blood work showed good kidney function.  Will treat him today with Kenalog  40 mg injection.  Provided diclofenac  75 mg 1 pill twice daily with food as needed for pain.  Patient needs to see family doctor or orthopedist as he had I think is really  aggravated his hip with the fall and then the activity on his cruise.  Follow-up here if symptoms do not improve, worsen or new symptoms occur but best follow-up would be with family practice or orthopedics.  I reviewed the plan of care with the patient and/or the patient's guardian.  The patient and/or guardian had time to ask questions and acknowledged that the questions were answered.  I provided instruction on symptoms or reasons to return here or to go to an ER, if symptoms/condition did not improve, worsened or if new symptoms occurred.   I spent 45 minutes (or longer) on patient care,  including face to face time and care planning/patient management.  Final Clinical Impressions(s) / UC Diagnoses   Final diagnoses:  Right thigh pain  Right hip pain  Dependent edema     Discharge Instructions      Right thigh and hip pain: Reviewed previous hip knee and CT scan.  Reviewed films today.  No acute fractures noted moderate arthritis of right hip.  I believe he irritated his hip and thigh when he fell on 12/28/23 and all of his activity on his cruise vacation has just exacerbated it.  Encouraged ice packs to the hip and thigh, half an hour on, half an hour off as needed for the next 2 or 3 days.  Kenalog  40 mg injection now.  Reviewed blood work from February and his kidney function was good.  We used diclofenac  75 mg 1 pill twice daily with food as needed for pain.  I believe he needs to see orthopedics and get further evaluated in the long-term as I see a lot of arthritis on the right hip.  Follow-up here as needed.   ED Prescriptions     Medication Sig Dispense Auth. Provider   diclofenac  (VOLTAREN ) 75 MG EC tablet 1 pill every 12 hours with food if needed for hip thigh or knee pain. 30 tablet Kaoru Rezendes, FNP      PDMP not reviewed this encounter.   Guss Legacy, FNP 01/13/24 1454

## 2024-01-13 NOTE — Discharge Instructions (Addendum)
 Right thigh and hip pain: Reviewed previous hip knee and CT scan.  Reviewed films today.  No acute fractures noted moderate arthritis of right hip.  I believe he irritated his hip and thigh when he fell on 12/28/23 and all of his activity on his cruise vacation has just exacerbated it.  Encouraged ice packs to the hip and thigh, half an hour on, half an hour off as needed for the next 2 or 3 days.  Kenalog  40 mg injection now.  Reviewed blood work from February and his kidney function was good.  We used diclofenac  75 mg 1 pill twice daily with food as needed for pain.  I believe he needs to see orthopedics and get further evaluated in the long-term as I see a lot of arthritis on the right hip.  Follow-up here as needed.

## 2024-01-13 NOTE — ED Triage Notes (Signed)
 Pt fell on 05/02 went to [facility] ER the xray was clear was given prednisone  and feeling better went on a cruise to [place] walked a lot and he seen the cruise doctor gave him Diclofenac  but did not help much. Pt just returned this morning at 2am. Pt reports pain from his right hip that runs down his leg .

## 2024-01-18 ENCOUNTER — Encounter (HOSPITAL_COMMUNITY): Payer: Self-pay

## 2024-01-18 ENCOUNTER — Inpatient Hospital Stay (HOSPITAL_COMMUNITY)
Admission: AD | Admit: 2024-01-18 | Discharge: 2024-01-22 | DRG: 522 | Disposition: A | Discharge status: Home Health | Source: Ambulatory Visit | Attending: Internal Medicine | Admitting: Internal Medicine

## 2024-01-18 DIAGNOSIS — Z9841 Cataract extraction status, right eye: Secondary | ICD-10-CM | POA: Diagnosis not present

## 2024-01-18 DIAGNOSIS — Z96641 Presence of right artificial hip joint: Secondary | ICD-10-CM | POA: Diagnosis not present

## 2024-01-18 DIAGNOSIS — S72001A Fracture of unspecified part of neck of right femur, initial encounter for closed fracture: Secondary | ICD-10-CM | POA: Diagnosis present

## 2024-01-18 DIAGNOSIS — E871 Hypo-osmolality and hyponatremia: Secondary | ICD-10-CM | POA: Diagnosis present

## 2024-01-18 DIAGNOSIS — Z85828 Personal history of other malignant neoplasm of skin: Secondary | ICD-10-CM

## 2024-01-18 DIAGNOSIS — N390 Urinary tract infection, site not specified: Secondary | ICD-10-CM | POA: Diagnosis present

## 2024-01-18 DIAGNOSIS — Z8673 Personal history of transient ischemic attack (TIA), and cerebral infarction without residual deficits: Secondary | ICD-10-CM | POA: Diagnosis not present

## 2024-01-18 DIAGNOSIS — E7800 Pure hypercholesterolemia, unspecified: Secondary | ICD-10-CM | POA: Diagnosis present

## 2024-01-18 DIAGNOSIS — I10 Essential (primary) hypertension: Secondary | ICD-10-CM | POA: Diagnosis present

## 2024-01-18 DIAGNOSIS — K219 Gastro-esophageal reflux disease without esophagitis: Secondary | ICD-10-CM | POA: Diagnosis present

## 2024-01-18 DIAGNOSIS — N3000 Acute cystitis without hematuria: Secondary | ICD-10-CM | POA: Diagnosis not present

## 2024-01-18 DIAGNOSIS — Z7982 Long term (current) use of aspirin: Secondary | ICD-10-CM | POA: Diagnosis not present

## 2024-01-18 DIAGNOSIS — W010XXA Fall on same level from slipping, tripping and stumbling without subsequent striking against object, initial encounter: Secondary | ICD-10-CM | POA: Diagnosis present

## 2024-01-18 DIAGNOSIS — D72829 Elevated white blood cell count, unspecified: Secondary | ICD-10-CM | POA: Diagnosis not present

## 2024-01-18 DIAGNOSIS — Z9842 Cataract extraction status, left eye: Secondary | ICD-10-CM

## 2024-01-18 DIAGNOSIS — Z79890 Hormone replacement therapy (postmenopausal): Secondary | ICD-10-CM | POA: Diagnosis not present

## 2024-01-18 DIAGNOSIS — R8271 Bacteriuria: Secondary | ICD-10-CM | POA: Diagnosis not present

## 2024-01-18 DIAGNOSIS — E039 Hypothyroidism, unspecified: Secondary | ICD-10-CM | POA: Diagnosis present

## 2024-01-18 DIAGNOSIS — Y92007 Garden or yard of unspecified non-institutional (private) residence as the place of occurrence of the external cause: Secondary | ICD-10-CM | POA: Diagnosis not present

## 2024-01-18 MED ORDER — ACETAMINOPHEN 650 MG RE SUPP: 650.0000 mg | Freq: Four times a day (QID) | RECTAL | Status: DC | PRN

## 2024-01-18 MED ORDER — LEVOTHYROXINE SODIUM 75 MCG PO TABS
150.0000 ug | ORAL_TABLET | Freq: Every day | ORAL | Status: DC
  Administered 2024-01-19 – 2024-01-22 (×3): 150 ug via ORAL
  Filled 2024-01-18 (×5): qty 2

## 2024-01-18 MED ORDER — ALBUTEROL SULFATE (2.5 MG/3ML) 0.083% IN NEBU: 2.5000 mg | INHALATION_SOLUTION | RESPIRATORY_TRACT | Status: DC | PRN

## 2024-01-18 MED ORDER — OXYCODONE HCL 5 MG PO TABS
5.0000 mg | ORAL_TABLET | ORAL | Status: DC | PRN
  Administered 2024-01-18 – 2024-01-20 (×6): 5 mg via ORAL
  Filled 2024-01-18 (×6): qty 1

## 2024-01-18 MED ORDER — ONDANSETRON HCL 4 MG/2ML IJ SOLN: 4.0000 mg | Freq: Four times a day (QID) | INTRAMUSCULAR | Status: DC | PRN

## 2024-01-18 MED ORDER — ENOXAPARIN SODIUM 40 MG/0.4ML IJ SOSY
40.0000 mg | PREFILLED_SYRINGE | INTRAMUSCULAR | Status: DC
  Administered 2024-01-18 – 2024-01-19 (×2): 40 mg via SUBCUTANEOUS
  Filled 2024-01-18 (×2): qty 0.4

## 2024-01-18 MED ORDER — PANTOPRAZOLE SODIUM 40 MG PO TBEC
40.0000 mg | DELAYED_RELEASE_TABLET | Freq: Every day | ORAL | Status: DC
  Administered 2024-01-19 – 2024-01-22 (×4): 40 mg via ORAL
  Filled 2024-01-18 (×4): qty 1

## 2024-01-18 MED ORDER — HYDROCHLOROTHIAZIDE 12.5 MG PO TABS
12.5000 mg | ORAL_TABLET | Freq: Every day | ORAL | Status: DC
  Administered 2024-01-19: 12.5 mg via ORAL
  Filled 2024-01-18: qty 1

## 2024-01-18 MED ORDER — IRBESARTAN 150 MG PO TABS
300.0000 mg | ORAL_TABLET | Freq: Every day | ORAL | Status: DC
  Administered 2024-01-19 – 2024-01-22 (×3): 300 mg via ORAL
  Filled 2024-01-18 (×3): qty 2

## 2024-01-18 MED ORDER — ONDANSETRON HCL 4 MG PO TABS: 4.0000 mg | ORAL_TABLET | Freq: Four times a day (QID) | ORAL | Status: DC | PRN

## 2024-01-18 MED ORDER — ACETAMINOPHEN 325 MG PO TABS
650.0000 mg | ORAL_TABLET | Freq: Four times a day (QID) | ORAL | Status: DC | PRN
Start: 2024-01-18 — End: 2024-01-20

## 2024-01-18 MED ORDER — TELMISARTAN-HCTZ 80-12.5 MG PO TABS: 1.0000 | ORAL_TABLET | Freq: Every day | ORAL | Status: DC

## 2024-01-18 MED ORDER — TRAZODONE HCL 50 MG PO TABS
25.0000 mg | ORAL_TABLET | Freq: Every evening | ORAL | Status: DC | PRN
  Administered 2024-01-22: 25 mg via ORAL
  Filled 2024-01-18: qty 1

## 2024-01-18 MED ORDER — MUPIROCIN 2 % EX OINT
1.0000 | TOPICAL_OINTMENT | Freq: Two times a day (BID) | CUTANEOUS | Status: DC
  Administered 2024-01-18 – 2024-01-22 (×5): 1 via NASAL
  Filled 2024-01-18 (×2): qty 22

## 2024-01-18 MED ORDER — POLYETHYLENE GLYCOL 3350 17 G PO PACK
17.0000 g | PACK | Freq: Every day | ORAL | Status: DC | PRN
  Administered 2024-01-19: 17 g via ORAL
  Filled 2024-01-18: qty 1

## 2024-01-18 NOTE — Plan of Care (Signed)
   Problem: Activity: Goal: Risk for activity intolerance will decrease Outcome: Progressing   Problem: Nutrition: Goal: Adequate nutrition will be maintained Outcome: Progressing   Problem: Coping: Goal: Level of anxiety will decrease Outcome: Progressing

## 2024-01-18 NOTE — Plan of Care (Signed)
   Problem: Education: Goal: Knowledge of General Education information will improve Description Including pain rating scale, medication(s)/side effects and non-pharmacologic comfort measures Outcome: Progressing

## 2024-01-18 NOTE — H&P (Signed)
 History and Physical  Shivan Nipp ZOX:[id number] DOB: [birth year] DOA: 01/18/2024  PCP: Jimmy Moulding, Clarke   Chief Complaint: Right hip pain  HPI: Robert Clarke is a 83 y.o. male with medical history significant for hypertension, GERD, hypothyroidism being admitted to the hospital with a right hip fracture.  Patient is quite healthy and active, he tripped over a hose in his yard on 12/27/2023 and hurt his right hip.  He went to the ER that day, also follow-up later with outpatient urgent care and was told that he did not have any fracture.  He actually went on a cruise to [place] in the middle of May, he was able to ambulate better, but continued to have pain so he fully went to Ortho urgent care today, where x-ray imaging revealed what appears to be a right femur fracture.  He was seen by orthopedics Dr. Bernard Brick, and directly admitted to the hospitalist service for planned surgical repair on 5/25.  Review of Systems: Please see HPI for pertinent positives and negatives. A complete 10 system review of systems are otherwise negative.  Past Medical History:  Diagnosis Date   Acid reflux    Basal cell carcinoma    under left eye   Basal cell carcinoma of nose    Cancer (HCC)    prostate   Diverticulosis    Hemorrhoids    History of adenomatous polyp of colon    Hypercholesteremia    Hyperlipidemia    Hypertension    Hypothyroidism    Pre-diabetes    SUNCT (short unilateral neuralgiform headache, conjunctival inj/tear) 09/12/2018   Thyroid  disease    goiter   TIA (transient ischemic attack) 2022   Past Surgical History:  Procedure Laterality Date   BIOPSY  02/16/2023   Procedure: BIOPSY;  Surgeon: Quintin Buckle, DO;  Location: [facility] ENDOSCOPY;  Service: Gastroenterology;;   CARTILAGE SURGERY Right    EAR   CATARACT EXTRACTION, BILATERAL Bilateral 2019   COLONOSCOPY WITH PROPOFOL  N/A 04/13/2015   Procedure: COLONOSCOPY WITH PROPOFOL ;  Surgeon: Garrett Kallman, Clarke;  Location:  [facility] ENDOSCOPY;  Service: Endoscopy;  Laterality: N/A;   COLONOSCOPY WITH PROPOFOL  N/A 08/30/2023   Procedure: COLONOSCOPY WITH PROPOFOL ;  Surgeon: Quintin Buckle, DO;  Location: [facility] ENDOSCOPY;  Service: Gastroenterology;  Laterality: N/A;   ESOPHAGOGASTRODUODENOSCOPY (EGD) WITH PROPOFOL  N/A 02/16/2023   Procedure: ESOPHAGOGASTRODUODENOSCOPY (EGD) WITH PROPOFOL ;  Surgeon: Quintin Buckle, DO;  Location: [facility] ENDOSCOPY;  Service: Gastroenterology;  Laterality: N/A;   EYE SURGERY     HEMORRHOID SURGERY N/A 10/18/2022   Procedure: HEMORRHOIDECTOMY;  Surgeon: Eldred Grego, Clarke;  Location: [facility] ORS;  Service: General;  Laterality: N/A;   HERNIA REPAIR     INGUINAL HERNIA REPAIR Bilateral    POLYPECTOMY  08/30/2023   Procedure: POLYPECTOMY;  Surgeon: Quintin Buckle, DO;  Location: [facility] ENDOSCOPY;  Service: Gastroenterology;;   PROSTATECTOMY  08/29/1999   SKIN CANCER EXCISION     TOTAL THYROIDECTOMY  1999   Social History:  reports that he has never smoked. He has never used smokeless tobacco. He reports current alcohol use. He reports that he does not use drugs.  Allergies  Allergen Reactions   Statins Other (See Comments)    Myalgia, Atorvastatin , Pravastatin   Iohexol  Hives    Iodine contrast dye     Family History  Problem Relation Age of Onset   Cancer - Colon Father      Prior to Admission medications   Medication Sig Start Date End  Date Taking? Authorizing Provider  HYDROcodone-acetaminophen  (NORCO/VICODIN) 5-325 MG tablet Take 1 tablet by mouth every 6 (six) hours as needed. 01/15/24  Yes Provider, Historical, Clarke  ASPIRIN  81 PO Take 81 mg by mouth daily.    Provider, Historical, Clarke  azelastine (ASTELIN) 0.1 % nasal spray Place 2 sprays into both nostrils 2 (two) times daily. Use in each nostril as directed    Provider, Historical, Clarke  diclofenac  (VOLTAREN ) 75 MG EC tablet 1 pill every 12 hours with food if needed for hip thigh or knee pain. 01/13/24   Guss Legacy, FNP  levothyroxine  (SYNTHROID ) 150 MCG tablet Take 150 mcg by mouth daily before breakfast.    Provider, Historical, Clarke  omeprazole (PRILOSEC) 40 MG capsule Take 40 mg by mouth daily.    Provider, Historical, Clarke  predniSONE  (DELTASONE ) 20 MG tablet Take 2 tablets (40 mg total) by mouth daily with breakfast. For the next four days 12/28/23   Dorenda Gandy, Clarke  telmisartan-hydrochlorothiazide (MICARDIS HCT) 80-12.5 MG tablet Take 1 tablet by mouth daily.    Provider, Historical, Clarke    Physical Exam: BP 139/78   Pulse 63   Temp 98.3 F (36.8 C) (Oral)   Resp 18   SpO2 100%  General:  Alert, oriented, calm, in no acute distress  Eyes: EOMI, clear conjuctivae, white sclerea Neck: supple, no masses, trachea mildline  Cardiovascular: RRR, no murmurs or rubs, no peripheral edema  Respiratory: clear to auscultation bilaterally, no wheezes, no crackles  Abdomen: soft, nontender, nondistended, normal bowel tones heard  Skin: dry, no rashes  Musculoskeletal: no joint effusions, right hip slightly tender to palpation, with pain on range of motion          Labs on Admission:  Basic Metabolic Panel: No results for input(s): "NA", "K", "CL", "CO2", "GLUCOSE", "BUN", "CREATININE", "CALCIUM ", "MG", "PHOS" in the last 168 hours. Liver Function Tests: No results for input(s): "AST", "ALT", "ALKPHOS", "BILITOT", "PROT", "ALBUMIN" in the last 168 hours. No results for input(s): "LIPASE", "AMYLASE" in the last 168 hours. No results for input(s): "AMMONIA" in the last 168 hours. CBC: No results for input(s): "WBC", "NEUTROABS", "HGB", "HCT", "MCV", "PLT" in the last 168 hours. Cardiac Enzymes: No results for input(s): "CKTOTAL", "CKMB", "CKMBINDEX", "TROPONINI" in the last 168 hours. BNP (last 3 results) No results for input(s): "BNP" in the last 8760 hours.  ProBNP (last 3 results) No results for input(s): "PROBNP" in the last 8760 hours.  CBG: No results for input(s): "GLUCAP" in the  last 168 hours.  Radiological Exams on Admission: No results found.  Assessment/Plan Robert Clarke is a 83 y.o. male with medical history significant for hypertension, GERD, hypothyroidism being admitted to the hospital with a right hip fracture.  Right hip fracture-seen on outpatient x-rays today with orthopedic surgery Dr. Bernard Brick -Inpatient mission -Pain control -Patient will be followed by orthopedic surgery in house, they plan surgical repair 5/25 -Will check baseline labs in the morning  Hypertension-continue Micardis  GERD-omeprazole  Hypothyroidism-Synthroid   DVT prophylaxis: Lovenox      Code Status: Full Code  Consults called: Orthopedic surgery is following  Admission status: The appropriate patient status for this patient is INPATIENT. Inpatient status is judged to be reasonable and necessary in order to provide the required intensity of service to ensure the patient's safety. The patient's presenting symptoms, physical exam findings, and initial radiographic and laboratory data in the context of their chronic comorbidities is felt to place them at high risk for further clinical deterioration.  Furthermore, it is not anticipated that the patient will be medically stable for discharge from the hospital within 2 midnights of admission.    I certify that at the point of admission it is my clinical judgment that the patient will require inpatient hospital care spanning beyond 2 midnights from the point of admission due to high intensity of service, high risk for further deterioration and high frequency of surveillance required  Time spent: 49 minutes  Robert Clarke [facility] Pager [phone]  If 7PM-7AM, please contact night-coverage [email or web address] Password [email or web address]  01/18/2024, 2:05 PM

## 2024-01-19 ENCOUNTER — Encounter (HOSPITAL_COMMUNITY): Payer: Self-pay | Admitting: Internal Medicine

## 2024-01-19 ENCOUNTER — Other Ambulatory Visit: Payer: Self-pay

## 2024-01-19 DIAGNOSIS — I10 Essential (primary) hypertension: Secondary | ICD-10-CM | POA: Diagnosis not present

## 2024-01-19 DIAGNOSIS — E039 Hypothyroidism, unspecified: Secondary | ICD-10-CM

## 2024-01-19 DIAGNOSIS — K219 Gastro-esophageal reflux disease without esophagitis: Secondary | ICD-10-CM | POA: Diagnosis not present

## 2024-01-19 DIAGNOSIS — S72001A Fracture of unspecified part of neck of right femur, initial encounter for closed fracture: Secondary | ICD-10-CM | POA: Diagnosis not present

## 2024-01-19 LAB — CBC
HCT: 44.6 % (ref 39.0–52.0)
Hemoglobin: 14 g/dL (ref 13.0–17.0)
MCH: 28.7 pg (ref 26.0–34.0)
MCHC: 31.4 g/dL (ref 30.0–36.0)
MCV: 91.4 fL (ref 80.0–100.0)
Platelets: 253 10*3/uL (ref 150–400)
RBC: 4.88 MIL/uL (ref 4.22–5.81)
RDW: 13.2 % (ref 11.5–15.5)
WBC: 6.7 10*3/uL (ref 4.0–10.5)
nRBC: 0 % (ref 0.0–0.2)

## 2024-01-19 LAB — BASIC METABOLIC PANEL WITH GFR
Anion gap: 7 (ref 5–15)
BUN: 22 mg/dL (ref 8–23)
CO2: 28 mmol/L (ref 22–32)
Calcium: 9.2 mg/dL (ref 8.9–10.3)
Chloride: 96 mmol/L — ABNORMAL LOW (ref 98–111)
Creatinine, Ser: 1 mg/dL (ref 0.61–1.24)
GFR, Estimated: >60 mL/min (ref >60)
Glucose, Bld: 93 mg/dL (ref 70–99)
Potassium: 4.6 mmol/L (ref 3.5–5.1)
Sodium: 131 mmol/L — ABNORMAL LOW (ref 135–145)

## 2024-01-19 LAB — SURGICAL PCR SCREEN
MRSA, PCR: NEGATIVE
Staphylococcus aureus: NEGATIVE

## 2024-01-19 MED ORDER — POLYETHYLENE GLYCOL 3350 17 G PO PACK: 17.0000 g | PACK | Freq: Every day | ORAL | Status: DC

## 2024-01-19 MED ORDER — AZELASTINE HCL 0.1 % NA SOLN: 2.0000 | Freq: Two times a day (BID) | NASAL | Status: DC | PRN

## 2024-01-19 MED ORDER — ASPIRIN 81 MG PO TBEC
81.0000 mg | DELAYED_RELEASE_TABLET | Freq: Every day | ORAL | Status: DC
  Administered 2024-01-19: 81 mg via ORAL
  Filled 2024-01-19: qty 1

## 2024-01-19 MED ORDER — ENSURE PRE-SURGERY PO LIQD
296.0000 mL | Freq: Once | ORAL | Status: AC
  Administered 2024-01-20: 296 mL via ORAL
  Filled 2024-01-19: qty 296

## 2024-01-19 MED ORDER — SENNOSIDES-DOCUSATE SODIUM 8.6-50 MG PO TABS
1.0000 | ORAL_TABLET | Freq: Two times a day (BID) | ORAL | Status: DC
  Administered 2024-01-19 (×2): 1 via ORAL
  Filled 2024-01-19 (×2): qty 1

## 2024-01-19 NOTE — Plan of Care (Signed)

## 2024-01-19 NOTE — Progress Notes (Signed)
 PROGRESS NOTE    Robert Clarke  WUJ:[id number] DOB: Jul 31, 1941 DOA: 01/18/2024 PCP: Jimmy Moulding, MD    No chief complaint on file.   Brief Narrative:  Patient is a 83 year old gentleman history of hypertension, GERD, hypothyroidism who was admitted to the hospital with right hip fracture.  Patient noted to have tripped over a hose on 12/27/2023 and hurt his right hip.  Patient was seen in the ED and also on follow-up at the outpatient urgent care where imaging was done and told he did not have a fracture.  Patient went on a cruise to [place] in the middle of May and had significant right hip pain with difficulty ambulating which was worsening and as such presented to the orthopedics urgent care at the direction of his PCP where imaging done revealed a right femur fracture.  Patient seen by orthopedics, Dr. Bernard Brick and admitted directly to the hospitalist service for planned surgical repair on 01/20/2024.   Assessment & Plan:   Principal Problem:   Closed right hip fracture, initial encounter [facility]) Active Problems:   Essential hypertension   Hypothyroidism   GERD (gastroesophageal reflux disease)   #1 right hip femoral neck fracture -Secondary to mechanical fall. -Patient seen in consultation by orthopedics and patient for right total hip replacement on 01/20/2024. - Pain management, DVT prophylaxis per orthopedics.  2.  Hypertension -Continue Avapro. - Discontinue HCTZ.  3.  Hypothyroidism -Continue home regimen Synthroid .  4.  GERD -PPI.   DVT prophylaxis: Lovenox  Code Status: Full Family Communication: Updated patient and wife at bedside. Disposition: TBD  Status is: Inpatient Remains inpatient appropriate because: Severity of illness   Consultants:  Orthopedics: Dr. Bernard Brick 01/19/2024  Procedures:  None  Antimicrobials:  Anti-infectives (From admission, onward)    None         Subjective: Laying in bed.  Wife at bedside.  Patient still complains of right  hip pain when he tries to raise his right lower extremity.  Unable to raise right lower extremity without using his arms.  Denies any chest pain or shortness of breath.   Objective: Vitals:   01/19/24 0146 01/19/24 0632 01/19/24 0940 01/19/24 1353  BP: (!) 112/59 (!) 146/78 (!) 116/56 (!) 113/53  Pulse: (!) 50 (!) 51 64 64  Resp: 16 17 15 16   Temp: 98.6 F (37 C) 98 F (36.7 C) 98.3 F (36.8 C) 98.5 F (36.9 C)  TempSrc:  Oral Oral Oral  SpO2: 99% 100% 100% 99%  Weight:      Height:        Intake/Output Summary (Last 24 hours) at 01/19/2024 1601 Last data filed at 01/19/2024 1400 Gross per 24 hour  Intake 840 ml  Output 750 ml  Net 90 ml   Filed Weights   01/18/24 1404 01/18/24 1410  Weight: 108 kg 108 kg    Examination:  General exam: Appears calm and comfortable  Respiratory system: Clear to auscultation. Respiratory effort normal. Cardiovascular system: S1 & S2 heard, RRR. No JVD, murmurs, rubs, gallops or clicks. No pedal edema. Gastrointestinal system: Abdomen is nondistended, soft and nontender. No organomegaly or masses felt. Normal bowel sounds heard. Central nervous system: Alert and oriented. No focal neurological deficits. Extremities: Right hip tender to palpation.   Skin: No rashes, lesions or ulcers Psychiatry: Judgement and insight appear normal. Mood & affect appropriate.     Data Reviewed: I have personally reviewed following labs and imaging studies  CBC: Recent Labs  Lab 01/19/24 0342  WBC  6.7  HGB 14.0  HCT 44.6  MCV 91.4  PLT 253    Basic Metabolic Panel: Recent Labs  Lab 01/19/24 0342  NA 131*  K 4.6  CL 96*  CO2 28  GLUCOSE 93  BUN 22  CREATININE 1.00  CALCIUM  9.2    GFR: Estimated Creatinine Clearance: 76.8 mL/min (by C-G formula based on SCr of 1 mg/dL).  Liver Function Tests: No results for input(s): "AST", "ALT", "ALKPHOS", "BILITOT", "PROT", "ALBUMIN" in the last 168 hours.  CBG: No results for input(s): "GLUCAP"  in the last 168 hours.   Recent Results (from the past 240 hours)  Surgical PCR screen     Status: None   Collection Time: 01/18/24  1:50 AM   Specimen: Nasal Mucosa; Nasal Swab  Result Value Ref Range Status   MRSA, PCR NEGATIVE NEGATIVE Final   Staphylococcus aureus NEGATIVE NEGATIVE Final    Comment: (NOTE) The Xpert SA Assay (FDA approved for NASAL specimens in patients 89 years of age and older), is one component of a comprehensive surveillance program. It is not intended to diagnose infection nor to guide or monitor treatment. Performed at [facility], [address], [place], [place] [address]          Radiology Studies: No results found.      Scheduled Meds:  aspirin  EC  81 mg Oral Daily   enoxaparin  (LOVENOX ) injection  40 mg Subcutaneous Q24H   irbesartan  300 mg Oral Daily   And   hydrochlorothiazide  12.5 mg Oral Daily   levothyroxine   150 mcg Oral Q0600   mupirocin ointment  1 Application Nasal BID   pantoprazole   40 mg Oral Daily   [START ON 01/20/2024] polyethylene glycol  17 g Oral Daily   senna-docusate  1 tablet Oral BID   Continuous Infusions:   LOS: 1 day    Time spent: 40 minutes    Hilda Lovings, MD [facility]   To contact the attending provider between 7A-7P or the covering provider during after hours 7P-7A, please log into the web site [email or web address] and access using universal [place] password for that web site. If you do not have the password, please call the hospital operator.  01/19/2024, 4:01 PM

## 2024-01-19 NOTE — Consult Note (Signed)
 Reason for Consult: right hip fracture Referring Physician: Hildy Lowers, MD (Hospitalist)  Robert Clarke is an 83 y.o. male.  HPI: Robert Clarke is a 83 y.o. male with medical history significant for hypertension, GERD, hypothyroidism being admitted to the hospital with a right hip fracture.  Patient is quite healthy and active, he tripped over a hose in his yard on 12/27/2023 and hurt his right hip.  He went to the ER that day, also follow-up later with outpatient urgent care and was told that he did not have any fracture.  He actually went on a cruise to [place] in the middle of May, he was able to ambulate better, but continued to have pain so he fully went to Ortho urgent care today, where x-ray/MRI imaging revealed what appears to be a right femur fracture.   I saw him in the office Friday the 23rd and reviewed options for management.  He opted for current admission to avoid further trauma and to be scheduled for a planned right THR on Sunday the 25th.  Past Medical History:  Diagnosis Date   Acid reflux    Basal cell carcinoma    under left eye   Basal cell carcinoma of nose    Cancer (HCC)    prostate   Diverticulosis    Hemorrhoids    History of adenomatous polyp of colon    Hypercholesteremia    Hyperlipidemia    Hypertension    Hypothyroidism    Pre-diabetes    SUNCT (short unilateral neuralgiform headache, conjunctival inj/tear) 09/12/2018   Thyroid  disease    goiter   TIA (transient ischemic attack) 2022    Past Surgical History:  Procedure Laterality Date   BIOPSY  02/16/2023   Procedure: BIOPSY;  Surgeon: Quintin Buckle, DO;  Location: [facility] ENDOSCOPY;  Service: Gastroenterology;;   CARTILAGE SURGERY Right    EAR   CATARACT EXTRACTION, BILATERAL Bilateral 2019   COLONOSCOPY WITH PROPOFOL  N/A 04/13/2015   Procedure: COLONOSCOPY WITH PROPOFOL ;  Surgeon: Garrett Kallman, MD;  Location: [facility] ENDOSCOPY;  Service: Endoscopy;  Laterality: N/A;   COLONOSCOPY WITH PROPOFOL  N/A  08/30/2023   Procedure: COLONOSCOPY WITH PROPOFOL ;  Surgeon: Quintin Buckle, DO;  Location: [facility] ENDOSCOPY;  Service: Gastroenterology;  Laterality: N/A;   ESOPHAGOGASTRODUODENOSCOPY (EGD) WITH PROPOFOL  N/A 02/16/2023   Procedure: ESOPHAGOGASTRODUODENOSCOPY (EGD) WITH PROPOFOL ;  Surgeon: Quintin Buckle, DO;  Location: [facility] ENDOSCOPY;  Service: Gastroenterology;  Laterality: N/A;   EYE SURGERY     HEMORRHOID SURGERY N/A 10/18/2022   Procedure: HEMORRHOIDECTOMY;  Surgeon: Eldred Grego, MD;  Location: [facility] ORS;  Service: General;  Laterality: N/A;   HERNIA REPAIR     INGUINAL HERNIA REPAIR Bilateral    POLYPECTOMY  08/30/2023   Procedure: POLYPECTOMY;  Surgeon: Quintin Buckle, DO;  Location: [facility] ENDOSCOPY;  Service: Gastroenterology;;   PROSTATECTOMY  08/29/1999   SKIN CANCER EXCISION     TOTAL THYROIDECTOMY  1999    Family History  Problem Relation Age of Onset   Cancer - Colon Father     Social History:  reports that he has never smoked. He has never used smokeless tobacco. He reports current alcohol use. He reports that he does not use drugs.  Allergies:  Allergies  Allergen Reactions   Statins Other (See Comments)    Myalgia, Atorvastatin , Pravastatin   Iohexol  Hives    Iodine contrast dye     Medications: I have reviewed the patient's current medications. Scheduled:  aspirin  EC  81 mg Oral Daily  enoxaparin  (LOVENOX ) injection  40 mg Subcutaneous Q24H   irbesartan  300 mg Oral Daily   And   hydrochlorothiazide  12.5 mg Oral Daily   levothyroxine   150 mcg Oral Q0600   mupirocin ointment  1 Application Nasal BID   pantoprazole   40 mg Oral Daily   [START ON 01/20/2024] polyethylene glycol  17 g Oral Daily   senna-docusate  1 tablet Oral BID    Results for orders placed or performed during the hospital encounter of 01/18/24 (from the past 24 hours)  Basic metabolic panel     Status: Abnormal   Collection Time: 01/19/24  3:42 AM  Result Value  Ref Range   Sodium 131 (L) 135 - 145 mmol/L   Potassium 4.6 3.5 - 5.1 mmol/L   Chloride 96 (L) 98 - 111 mmol/L   CO2 28 22 - 32 mmol/L   Glucose, Bld 93 70 - 99 mg/dL   BUN 22 8 - 23 mg/dL   Creatinine, Ser 1.61 0.61 - 1.24 mg/dL   Calcium  9.2 8.9 - 10.3 mg/dL   GFR, Estimated >09 >60 mL/min   Anion gap 7 5 - 15  CBC     Status: None   Collection Time: 01/19/24  3:42 AM  Result Value Ref Range   WBC 6.7 4.0 - 10.5 K/uL   RBC 4.88 4.22 - 5.81 MIL/uL   Hemoglobin 14.0 13.0 - 17.0 g/dL   HCT 45.4 09.8 - 11.9 %   MCV 91.4 80.0 - 100.0 fL   MCH 28.7 26.0 - 34.0 pg   MCHC 31.4 30.0 - 36.0 g/dL   RDW 14.7 82.9 - 56.2 %   Platelets 253 150 - 400 K/uL   nRBC 0.0 0.0 - 0.2 %     X-ray: MRI reveals non displaced right femoral neck fracture  ROS: As per HPI  Blood pressure (!) 116/56, pulse 64, temperature 98.3 F (36.8 C), temperature source Oral, resp. rate 15, height 6' 3.98" (1.93 m), weight 108 kg, SpO2 100%.  Physical Exam: General:  Alert, oriented, calm, in no acute distress  Eyes: EOMI, clear conjuctivae, white sclerea Neck: supple, no masses, trachea mildline  Cardiovascular: RRR, no murmurs or rubs, no peripheral edema  Respiratory: clear to auscultation bilaterally, no wheezes, no crackles  Abdomen: soft, nontender, nondistended, normal bowel tones heard  Skin: dry, no rashes  Musculoskeletal: no joint effusions, right hip slightly tender to palpation, with pain on range of motion  Assessment/Plan: Right hip femoral neck fracture  Plan: AS per our discussion in the office he has elected to proceed with a right total hip replacement to manage his femoral neck fracture.  Risks and benefits, pros and cons of options reviewed.  Post operative course reviewed as well as expectations Orders placed including consent for planned procedure on Sunday  Robert Clarke 01/19/2024, 12:32 PM

## 2024-01-20 ENCOUNTER — Ambulatory Visit (HOSPITAL_COMMUNITY): Admission: RE | Admit: 2024-01-20 | Source: Home / Self Care | Admitting: Orthopedic Surgery

## 2024-01-20 ENCOUNTER — Inpatient Hospital Stay (HOSPITAL_COMMUNITY)

## 2024-01-20 ENCOUNTER — Inpatient Hospital Stay (HOSPITAL_COMMUNITY): Admitting: Anesthesiology

## 2024-01-20 ENCOUNTER — Encounter (HOSPITAL_COMMUNITY): Payer: Self-pay | Admitting: Internal Medicine

## 2024-01-20 ENCOUNTER — Encounter (HOSPITAL_COMMUNITY)
Admission: AD | Disposition: A | Discharge status: Home Health | Payer: Self-pay | Source: Ambulatory Visit | Attending: Internal Medicine

## 2024-01-20 DIAGNOSIS — K219 Gastro-esophageal reflux disease without esophagitis: Secondary | ICD-10-CM

## 2024-01-20 DIAGNOSIS — S72001A Fracture of unspecified part of neck of right femur, initial encounter for closed fracture: Secondary | ICD-10-CM | POA: Diagnosis not present

## 2024-01-20 DIAGNOSIS — I10 Essential (primary) hypertension: Secondary | ICD-10-CM | POA: Diagnosis not present

## 2024-01-20 DIAGNOSIS — Z96641 Presence of right artificial hip joint: Secondary | ICD-10-CM

## 2024-01-20 DIAGNOSIS — E039 Hypothyroidism, unspecified: Secondary | ICD-10-CM

## 2024-01-20 DIAGNOSIS — E871 Hypo-osmolality and hyponatremia: Secondary | ICD-10-CM | POA: Insufficient documentation

## 2024-01-20 HISTORY — PX: TOTAL HIP ARTHROPLASTY: SHX124

## 2024-01-20 LAB — RENAL FUNCTION PANEL
Albumin: 3.4 g/dL — ABNORMAL LOW (ref 3.5–5.0)
Anion gap: 8 (ref 5–15)
BUN: 24 mg/dL — ABNORMAL HIGH (ref 8–23)
CO2: 27 mmol/L (ref 22–32)
Calcium: 9.2 mg/dL (ref 8.9–10.3)
Chloride: 95 mmol/L — ABNORMAL LOW (ref 98–111)
Creatinine, Ser: 1.08 mg/dL (ref 0.61–1.24)
GFR, Estimated: >60 mL/min (ref >60)
Glucose, Bld: 65 mg/dL — ABNORMAL LOW (ref 70–99)
Phosphorus: 3.2 mg/dL (ref 2.5–4.6)
Potassium: 4 mmol/L (ref 3.5–5.1)
Sodium: 130 mmol/L — ABNORMAL LOW (ref 135–145)

## 2024-01-20 LAB — CBC WITH DIFFERENTIAL/PLATELET
Abs Immature Granulocytes: 0.05 10*3/uL (ref 0.00–0.07)
Basophils Absolute: 0.1 10*3/uL (ref 0.0–0.1)
Basophils Relative: 1 %
Eosinophils Absolute: 0.3 10*3/uL (ref 0.0–0.5)
Eosinophils Relative: 3 %
HCT: 44.4 % (ref 39.0–52.0)
Hemoglobin: 14.4 g/dL (ref 13.0–17.0)
Immature Granulocytes: 1 %
Lymphocytes Relative: 19 %
Lymphs Abs: 1.8 10*3/uL (ref 0.7–4.0)
MCH: 29.1 pg (ref 26.0–34.0)
MCHC: 32.4 g/dL (ref 30.0–36.0)
MCV: 89.7 fL (ref 80.0–100.0)
Monocytes Absolute: 0.8 10*3/uL (ref 0.1–1.0)
Monocytes Relative: 9 %
Neutro Abs: 6.4 10*3/uL (ref 1.7–7.7)
Neutrophils Relative %: 67 %
Platelets: 291 10*3/uL (ref 150–400)
RBC: 4.95 MIL/uL (ref 4.22–5.81)
RDW: 13.2 % (ref 11.5–15.5)
WBC: 9.5 10*3/uL (ref 4.0–10.5)
nRBC: 0 % (ref 0.0–0.2)

## 2024-01-20 LAB — MAGNESIUM: Magnesium: 2.2 mg/dL (ref 1.7–2.4)

## 2024-01-20 SURGERY — ARTHROPLASTY, HIP, TOTAL, ANTERIOR APPROACH
Anesthesia: Monitor Anesthesia Care | Site: Hip | Laterality: Right

## 2024-01-20 MED ORDER — PROPOFOL 10 MG/ML IV BOLUS
INTRAVENOUS | Status: AC
Start: 2024-01-20 — End: ?
  Filled 2024-01-20: qty 20

## 2024-01-20 MED ORDER — CEFAZOLIN SODIUM-DEXTROSE 2-4 GM/100ML-% IV SOLN
2.0000 g | INTRAVENOUS | Status: AC
  Administered 2024-01-20: 2 g via INTRAVENOUS

## 2024-01-20 MED ORDER — ACETAMINOPHEN 500 MG PO TABS
1000.0000 mg | ORAL_TABLET | Freq: Four times a day (QID) | ORAL | Status: DC
  Administered 2024-01-20 – 2024-01-22 (×7): 1000 mg via ORAL
  Filled 2024-01-20 (×8): qty 2

## 2024-01-20 MED ORDER — ASPIRIN 81 MG PO CHEW
81.0000 mg | CHEWABLE_TABLET | Freq: Two times a day (BID) | ORAL | Status: DC
  Administered 2024-01-20 – 2024-01-22 (×4): 81 mg via ORAL
  Filled 2024-01-20 (×4): qty 1

## 2024-01-20 MED ORDER — LIDOCAINE HCL (PF) 2 % IJ SOLN
INTRAMUSCULAR | Status: AC
  Filled 2024-01-20: qty 10

## 2024-01-20 MED ORDER — TRANEXAMIC ACID-NACL 1000-0.7 MG/100ML-% IV SOLN
INTRAVENOUS | Status: AC
  Filled 2024-01-20: qty 100

## 2024-01-20 MED ORDER — SODIUM CHLORIDE 0.9% FLUSH: 3.0000 mL | Freq: Two times a day (BID) | INTRAVENOUS | Status: DC

## 2024-01-20 MED ORDER — DEXAMETHASONE SODIUM PHOSPHATE 10 MG/ML IJ SOLN
INTRAMUSCULAR | Status: DC | PRN
  Administered 2024-01-20: 8 mg via INTRAVENOUS

## 2024-01-20 MED ORDER — ONDANSETRON HCL 4 MG PO TABS: 4.0000 mg | ORAL_TABLET | Freq: Four times a day (QID) | ORAL | Status: DC | PRN

## 2024-01-20 MED ORDER — ACETAMINOPHEN 10 MG/ML IV SOLN
INTRAVENOUS | Status: DC | PRN
Start: 2024-01-20 — End: 2024-01-20
  Administered 2024-01-20: 1000 mg via INTRAVENOUS

## 2024-01-20 MED ORDER — METOCLOPRAMIDE HCL 5 MG PO TABS: 5.0000 mg | ORAL_TABLET | Freq: Three times a day (TID) | ORAL | Status: DC | PRN

## 2024-01-20 MED ORDER — ALBUMIN HUMAN 5 % IV SOLN
INTRAVENOUS | Status: AC
  Filled 2024-01-20: qty 250

## 2024-01-20 MED ORDER — ACETAMINOPHEN 10 MG/ML IV SOLN
INTRAVENOUS | Status: AC
  Filled 2024-01-20: qty 100

## 2024-01-20 MED ORDER — SODIUM CHLORIDE 0.9 % IV SOLN: INTRAVENOUS | Status: AC

## 2024-01-20 MED ORDER — SODIUM CHLORIDE 0.9% FLUSH: 3.0000 mL | INTRAVENOUS | Status: DC | PRN

## 2024-01-20 MED ORDER — MENTHOL 3 MG MT LOZG: 1.0000 | LOZENGE | OROMUCOSAL | Status: DC | PRN

## 2024-01-20 MED ORDER — METOCLOPRAMIDE HCL 5 MG/ML IJ SOLN: 5.0000 mg | Freq: Three times a day (TID) | INTRAMUSCULAR | Status: DC | PRN

## 2024-01-20 MED ORDER — HYDROMORPHONE HCL 1 MG/ML IJ SOLN
0.5000 mg | INTRAMUSCULAR | Status: DC | PRN
  Filled 2024-01-20: qty 1

## 2024-01-20 MED ORDER — METHOCARBAMOL 1000 MG/10ML IJ SOLN: 500.0000 mg | Freq: Four times a day (QID) | INTRAMUSCULAR | Status: DC | PRN

## 2024-01-20 MED ORDER — TRANEXAMIC ACID-NACL 1000-0.7 MG/100ML-% IV SOLN
1000.0000 mg | INTRAVENOUS | Status: AC
  Administered 2024-01-20: 1000 mg via INTRAVENOUS

## 2024-01-20 MED ORDER — 0.9 % SODIUM CHLORIDE (POUR BTL) OPTIME
TOPICAL | Status: DC | PRN
  Administered 2024-01-20: 1000 mL

## 2024-01-20 MED ORDER — TRANEXAMIC ACID-NACL 1000-0.7 MG/100ML-% IV SOLN: 1000.0000 mg | Freq: Once | INTRAVENOUS | Status: DC

## 2024-01-20 MED ORDER — SODIUM CHLORIDE (PF) 0.9 % IJ SOLN
INTRAMUSCULAR | Status: DC | PRN
  Administered 2024-01-20: 61 mL via INTRAMUSCULAR

## 2024-01-20 MED ORDER — SENNA 8.6 MG PO TABS
2.0000 | ORAL_TABLET | Freq: Every day | ORAL | Status: DC
  Administered 2024-01-20: 17.2 mg via ORAL
  Filled 2024-01-20: qty 2

## 2024-01-20 MED ORDER — DEXAMETHASONE SODIUM PHOSPHATE 10 MG/ML IJ SOLN
10.0000 mg | Freq: Once | INTRAMUSCULAR | Status: AC
  Administered 2024-01-21: 10 mg via INTRAVENOUS
  Filled 2024-01-20: qty 1

## 2024-01-20 MED ORDER — FENTANYL CITRATE PF 50 MCG/ML IJ SOSY: 25.0000 ug | PREFILLED_SYRINGE | INTRAMUSCULAR | Status: DC | PRN

## 2024-01-20 MED ORDER — CHLORHEXIDINE GLUCONATE 4 % EX SOLN: 60.0000 mL | Freq: Once | CUTANEOUS | Status: DC

## 2024-01-20 MED ORDER — ONDANSETRON HCL 4 MG/2ML IJ SOLN
INTRAMUSCULAR | Status: AC
  Filled 2024-01-20: qty 4

## 2024-01-20 MED ORDER — POLYETHYLENE GLYCOL 3350 17 G PO PACK
17.0000 g | PACK | Freq: Two times a day (BID) | ORAL | Status: DC
  Administered 2024-01-20 – 2024-01-21 (×4): 17 g via ORAL
  Filled 2024-01-20 (×5): qty 1

## 2024-01-20 MED ORDER — ALUM & MAG HYDROXIDE-SIMETH 200-200-20 MG/5ML PO SUSP
30.0000 mL | ORAL | Status: DC | PRN
  Administered 2024-01-20: 30 mL via ORAL
  Filled 2024-01-20: qty 30

## 2024-01-20 MED ORDER — PHENOL 1.4 % MT LIQD: 1.0000 | OROMUCOSAL | Status: DC | PRN

## 2024-01-20 MED ORDER — TRANEXAMIC ACID-NACL 1000-0.7 MG/100ML-% IV SOLN
1000.0000 mg | Freq: Once | INTRAVENOUS | Status: AC
  Administered 2024-01-20: 1000 mg via INTRAVENOUS
  Filled 2024-01-20: qty 100

## 2024-01-20 MED ORDER — PROPOFOL 10 MG/ML IV BOLUS
INTRAVENOUS | Status: AC
  Filled 2024-01-20: qty 20

## 2024-01-20 MED ORDER — ACETAMINOPHEN 10 MG/ML IV SOLN: 1000.0000 mg | Freq: Once | INTRAVENOUS | Status: DC | PRN

## 2024-01-20 MED ORDER — BUPIVACAINE IN DEXTROSE 0.75-8.25 % IT SOLN
INTRATHECAL | Status: DC | PRN
  Administered 2024-01-20: 2 mL via INTRATHECAL

## 2024-01-20 MED ORDER — PROPOFOL 500 MG/50ML IV EMUL
INTRAVENOUS | Status: DC | PRN
  Administered 2024-01-20: 75 ug/kg/min via INTRAVENOUS

## 2024-01-20 MED ORDER — FENTANYL CITRATE (PF) 100 MCG/2ML IJ SOLN
INTRAMUSCULAR | Status: DC | PRN
  Administered 2024-01-20: 50 ug via INTRAVENOUS

## 2024-01-20 MED ORDER — DIPHENHYDRAMINE HCL 12.5 MG/5ML PO ELIX: 12.5000 mg | ORAL_SOLUTION | ORAL | Status: DC | PRN

## 2024-01-20 MED ORDER — DEXAMETHASONE SODIUM PHOSPHATE 10 MG/ML IJ SOLN
INTRAMUSCULAR | Status: AC
Start: 2024-01-20 — End: ?
  Filled 2024-01-20: qty 2

## 2024-01-20 MED ORDER — EPHEDRINE SULFATE-NACL 50-0.9 MG/10ML-% IV SOSY
PREFILLED_SYRINGE | INTRAVENOUS | Status: DC | PRN
  Administered 2024-01-20: 5 mg via INTRAVENOUS
  Administered 2024-01-20: 10 mg via INTRAVENOUS

## 2024-01-20 MED ORDER — KETOROLAC TROMETHAMINE 30 MG/ML IJ SOLN
INTRAMUSCULAR | Status: AC
  Filled 2024-01-20: qty 1

## 2024-01-20 MED ORDER — POVIDONE-IODINE 10 % EX SWAB: 2.0000 | Freq: Once | CUTANEOUS | Status: DC

## 2024-01-20 MED ORDER — METHOCARBAMOL 500 MG PO TABS
500.0000 mg | ORAL_TABLET | Freq: Four times a day (QID) | ORAL | Status: DC | PRN
  Administered 2024-01-20 – 2024-01-22 (×4): 500 mg via ORAL
  Filled 2024-01-20 (×4): qty 1

## 2024-01-20 MED ORDER — PHENYLEPHRINE HCL-NACL 20-0.9 MG/250ML-% IV SOLN
INTRAVENOUS | Status: DC | PRN
Start: 2024-01-20 — End: 2024-01-20
  Administered 2024-01-20: 25 ug/min via INTRAVENOUS

## 2024-01-20 MED ORDER — FENTANYL CITRATE (PF) 100 MCG/2ML IJ SOLN
INTRAMUSCULAR | Status: AC
  Filled 2024-01-20: qty 2

## 2024-01-20 MED ORDER — STERILE WATER FOR IRRIGATION IR SOLN
Status: DC | PRN
  Administered 2024-01-20: 2000 mL

## 2024-01-20 MED ORDER — CEFAZOLIN SODIUM-DEXTROSE 2-4 GM/100ML-% IV SOLN
2.0000 g | Freq: Four times a day (QID) | INTRAVENOUS | Status: AC
  Administered 2024-01-20 (×2): 2 g via INTRAVENOUS
  Filled 2024-01-20 (×2): qty 100

## 2024-01-20 MED ORDER — CEFAZOLIN SODIUM-DEXTROSE 2-4 GM/100ML-% IV SOLN
INTRAVENOUS | Status: AC
  Filled 2024-01-20: qty 100

## 2024-01-20 MED ORDER — PROPOFOL 1000 MG/100ML IV EMUL
INTRAVENOUS | Status: AC
  Filled 2024-01-20: qty 200

## 2024-01-20 MED ORDER — OXYCODONE HCL 5 MG PO TABS
2.5000 mg | ORAL_TABLET | ORAL | Status: DC | PRN
Start: 2024-01-20 — End: 2024-01-22
  Administered 2024-01-21: 5 mg via ORAL
  Filled 2024-01-20 (×3): qty 1

## 2024-01-20 MED ORDER — LIDOCAINE 2% (20 MG/ML) 5 ML SYRINGE
INTRAMUSCULAR | Status: DC | PRN
  Administered 2024-01-20: 60 mg via INTRAVENOUS

## 2024-01-20 MED ORDER — ONDANSETRON HCL 4 MG/2ML IJ SOLN
INTRAMUSCULAR | Status: DC | PRN
  Administered 2024-01-20: 4 mg via INTRAVENOUS

## 2024-01-20 MED ORDER — ONDANSETRON HCL 4 MG/2ML IJ SOLN: 4.0000 mg | Freq: Four times a day (QID) | INTRAMUSCULAR | Status: DC | PRN

## 2024-01-20 MED ORDER — ALBUMIN HUMAN 5 % IV SOLN
12.5000 g | Freq: Once | INTRAVENOUS | Status: AC
  Administered 2024-01-20: 12.5 g via INTRAVENOUS

## 2024-01-20 MED ORDER — SODIUM CHLORIDE (PF) 0.9 % IJ SOLN
INTRAMUSCULAR | Status: AC
  Filled 2024-01-20: qty 50

## 2024-01-20 MED ORDER — BISACODYL 10 MG RE SUPP: 10.0000 mg | Freq: Every day | RECTAL | Status: DC | PRN

## 2024-01-20 MED ORDER — BUPIVACAINE-EPINEPHRINE (PF) 0.25% -1:200000 IJ SOLN
INTRAMUSCULAR | Status: AC
  Filled 2024-01-20: qty 30

## 2024-01-20 MED ORDER — LACTATED RINGERS IV SOLN: INTRAVENOUS | Status: DC | PRN

## 2024-01-20 MED ORDER — OXYCODONE HCL 5 MG PO TABS
5.0000 mg | ORAL_TABLET | ORAL | Status: DC | PRN
  Administered 2024-01-20 – 2024-01-22 (×4): 5 mg via ORAL
  Filled 2024-01-20 (×3): qty 1

## 2024-01-20 MED ORDER — PROPOFOL 10 MG/ML IV BOLUS
INTRAVENOUS | Status: DC | PRN
  Administered 2024-01-20: 20 mg via INTRAVENOUS

## 2024-01-20 SURGICAL SUPPLY — 38 items
BAG COUNTER SPONGE SURGICOUNT (BAG) IMPLANT
BAG ZIPLOCK 12X15 (MISCELLANEOUS) IMPLANT
BLADE SAG 18X100X1.27 (BLADE) ×1 IMPLANT
COVER PERINEAL POST (MISCELLANEOUS) ×1 IMPLANT
COVER SURGICAL LIGHT HANDLE (MISCELLANEOUS) ×1 IMPLANT
CUP ACET PINNCLE SECTR II 60MM (Hips) IMPLANT
DERMABOND ADVANCED .7 DNX12 (GAUZE/BANDAGES/DRESSINGS) ×1 IMPLANT
DRAPE FOOT SWITCH (DRAPES) ×1 IMPLANT
DRAPE STERI IOBAN 125X83 (DRAPES) ×1 IMPLANT
DRAPE U-SHAPE 47X51 STRL (DRAPES) ×2 IMPLANT
DRESSING AQUACEL AG SP 3.5X10 (GAUZE/BANDAGES/DRESSINGS) ×1 IMPLANT
DRSG AQUACEL AG ADV 3.5X10 (GAUZE/BANDAGES/DRESSINGS) IMPLANT
DURAPREP 26ML APPLICATOR (WOUND CARE) ×1 IMPLANT
ELECT REM PT RETURN 15FT ADLT (MISCELLANEOUS) ×1 IMPLANT
GLOVE BIO SURGEON STRL SZ 6 (GLOVE) ×1 IMPLANT
GLOVE BIOGEL PI IND STRL 6.5 (GLOVE) ×1 IMPLANT
GLOVE BIOGEL PI IND STRL 7.5 (GLOVE) ×1 IMPLANT
GLOVE ORTHO TXT STRL SZ7.5 (GLOVE) ×2 IMPLANT
GOWN STRL REUS W/ TWL LRG LVL3 (GOWN DISPOSABLE) ×2 IMPLANT
HEAD ARTICULEZE (Hips) IMPLANT
HOLDER FOLEY CATH W/STRAP (MISCELLANEOUS) ×1 IMPLANT
KIT TURNOVER KIT A (KITS) IMPLANT
LINER NEUTRAL 58X36MM PLUS4 IMPLANT
MANIFOLD NEPTUNE II (INSTRUMENTS) ×1 IMPLANT
NDL SAFETY ECLIPSE 18X1.5 (NEEDLE) IMPLANT
PACK ANTERIOR HIP CUSTOM (KITS) ×1 IMPLANT
PENCIL SMOKE EVACUATOR (MISCELLANEOUS) ×1 IMPLANT
SCREW 6.5MMX35MM (Screw) IMPLANT
STEM FEM ACTIS HIGH SZ10 (Stem) IMPLANT
SUT MNCRL AB 4-0 PS2 18 (SUTURE) ×1 IMPLANT
SUT VIC AB 1 CT1 36 (SUTURE) ×3 IMPLANT
SUT VIC AB 2-0 CT1 TAPERPNT 27 (SUTURE) ×2 IMPLANT
SUTURE STRATFX 0 PDS 27 VIOLET (SUTURE) ×1 IMPLANT
SYR 3ML LL SCALE MARK (SYRINGE) IMPLANT
TOWEL GREEN STERILE FF (TOWEL DISPOSABLE) ×1 IMPLANT
TRAY FOLEY MTR SLVR 16FR STAT (SET/KITS/TRAYS/PACK) ×1 IMPLANT
TUBE SUCTION HIGH CAP CLEAR NV (SUCTIONS) ×1 IMPLANT
WATER STERILE IRR 1000ML POUR (IV SOLUTION) ×1 IMPLANT

## 2024-01-20 NOTE — Anesthesia Postprocedure Evaluation (Signed)
 Anesthesia Post Note  Patient: Robert Clarke  Procedure(s) Performed: ARTHROPLASTY, HIP, TOTAL, ANTERIOR APPROACH (Right: Hip)     Patient location during evaluation: PACU Anesthesia Type: MAC and Spinal Level of consciousness: awake and alert Pain management: pain level controlled Vital Signs Assessment: post-procedure vital signs reviewed and stable Respiratory status: spontaneous breathing, nonlabored ventilation, respiratory function stable and patient connected to nasal cannula oxygen Cardiovascular status: stable and blood pressure returned to baseline Postop Assessment: no apparent nausea or vomiting Anesthetic complications: no   No notable events documented.  Last Vitals:  Vitals:   01/20/24 1015 01/20/24 1035  BP: (!) 123/94 132/79  Pulse: 67 63  Resp: 13 16  Temp:  (!) 36.1 C  SpO2: 96% 96%    Last Pain:  Vitals:   01/20/24 1035  TempSrc:   PainSc: 0-No pain                 Theotis Flake P Balbina Depace

## 2024-01-20 NOTE — Op Note (Signed)
 NAME:  Robert Clarke                ACCOUNT NO.: [id number]      MEDICAL RECORD NO.: [id number]      FACILITY:  [facility]      PHYSICIAN:  Bevin Bucks  DATE OF BIRTH:  December 16, 1940     DATE OF PROCEDURE:  01/20/2024                                 OPERATIVE REPORT         PREOPERATIVE DIAGNOSIS: Right hip femoral neck fracture     POSTOPERATIVE DIAGNOSIS:  Right hip femoral neck fracture     PROCEDURE:  Right total hip replacement through an anterior approach   utilizing DePuy THR system, component size 60 mm pinnacle cup, a size 36+4 neutral   Altrex liner, a size 10 Hi Actis stem with a 36+5 Articuleze metal head ball      SURGEON:  Azalea Lento. Bernard Brick, M.D.      ASSISTANT:  Kim Pen, PA-C     ANESTHESIA:  Spinal.      SPECIMENS:  None.      COMPLICATIONS:  None.      BLOOD LOSS:  250 cc     DRAINS:  None.      INDICATION OF THE PROCEDURE:  Robert Clarke is a 83 y.o. male who presented to our office for evaluation of a right femoral neck fracture.  This apparently occurred about 3 weeks prior to admission with a fall.  Plain radiographs did not indicate concern for fracture however based on persistent pain with ambulation MRI was ordered.  MRI revealed nondisplaced femoral neck fracture.  He was admitted to the hospital.  We had discussion regarding the pros and cons of percutaneous screw fixation versus total hip replacement.  Based off our discussion he opted to proceed with a right total hip replacement.  Risks of infection DVT dislocation neurovascular injury and the need for future surgeries reviewed.  Consent was obtained for management of his hip fracture and pain relief.     PROCEDURE IN DETAIL:  The patient was brought to operative theater.   Once adequate anesthesia, preoperative antibiotics, 2 gm of Ancef , 1 gm of Tranexamic Acid, and 10 mg of Decadron  were administered, the patient was positioned supine on the [employer] table.  Once the patient  was safely positioned with adequate padding of boney prominences we predraped out the hip, and used fluoroscopy to confirm orientation of the pelvis.      The right hip was then prepped and draped from proximal iliac crest to   mid thigh with a shower curtain technique.      Time-out was performed identifying the patient, planned procedure, and the appropriate extremity.     An incision was then made 2 cm lateral to the   anterior superior iliac spine extending over the orientation of the   tensor fascia lata muscle and sharp dissection was carried down to the   fascia of the muscle.      The fascia was then incised.  The muscle belly was identified and swept   laterally and retractor placed along the superior neck.  Following   cauterization of the circumflex vessels and removing some pericapsular   fat, a second cobra retractor was placed on the inferior neck.  A T-capsulotomy was made along the  line of the   superior neck to the trochanteric fossa, then extended proximally and   distally.  Tag sutures were placed and the retractors were then placed   intracapsular.  We then identified the trochanteric fossa and   orientation of my neck cut and then made a neck osteotomy with the femur on traction.  The fractured femoral neck segment and femoral head were removed without difficulty or complication.  Traction was let   off and retractors were placed posterior and anterior around the   acetabulum.      The labrum and foveal tissue were debrided.  I began reaming with a 50 mm   reamer and reamed up to 59 mm reamer with good bony bed preparation and a 60 mm  cup was chosen.  The final 60 mm Pinnacle cup was then impacted under fluoroscopy to confirm the depth of penetration and orientation with respect to   Abduction and forward flexion.  A screw was placed into the ilium followed by the hole eliminator.  The final   36+4 neutral Altrex liner was impacted with good visualized rim fit.  The  cup was positioned anatomically within the acetabular portion of the pelvis.      At this point, the femur was rolled to 100 degrees.  Further capsule was   released off the inferior aspect of the femoral neck.  I then   released the superior capsule proximally.  With the leg in a neutral position the hook was placed laterally   along the femur under the vastus lateralis origin and elevated manually and then held in position using the hook attachment on the bed.  The leg was then extended and adducted with the leg rolled to 100   degrees of external rotation.  Retractors were placed along the medial calcar and posteriorly over the greater trochanter.  Once the proximal femur was fully   exposed, I used a box osteotome to set orientation.  I then began   broaching with the starting chili pepper broach and passed this by hand and then broached up to 10.  With the 10 broach in place I chose a high offset neck and did several trial reductions.  The offset was appropriate, leg lengths   appeared to be equal best matched with the +5 head ball trial confirmed radiographically.   Given these findings, I went ahead and dislocated the hip, repositioned all   retractors and positioned the right hip in the extended and abducted position.  The final 10 Hi Actis stem was   chosen and it was impacted down to the level of neck cut.  Based on this   and the trial reductions, a final 36+5 Articuleze metal head ball was chosen and   impacted onto a clean and dry trunnion, and the hip was reduced.  The   hip had been irrigated throughout the case again at this point.  I did   reapproximate the superior capsular leaflet to the anterior leaflet   using #1 Vicryl.  The fascia of the   tensor fascia lata muscle was then reapproximated using #1 Vicryl and #0 Stratafix sutures.  The   remaining wound was closed with 2-0 Vicryl and running 4-0 Monocryl.   The hip was cleaned, dried, and dressed sterilely using Dermabond  and   Aquacel dressing.  The patient was then brought   to recovery room in stable condition tolerating the procedure well.   Kim Pen, PA-C was present for  the entirety of the case involved from   preoperative positioning, perioperative retractor management, general   facilitation of the case, as well as primary wound closure as assistant.            Azalea Lento Bernard Brick, M.D.        01/20/2024 8:48 AM

## 2024-01-20 NOTE — Progress Notes (Signed)
 Patient ID: Robert Clarke, male   DOB: 1940-09-24, 83 y.o.   MRN: [id number] Relatively comfortable, no events Ready for OR today  To OR this am for right THR as reviewed Consent on chart Post op plan based on functional capabilities with PT but likely home Monday or Tuesday

## 2024-01-20 NOTE — Progress Notes (Signed)
   01/20/24 0844  TOC Brief Assessment  Insurance and Status Reviewed ([facility]- Notification done)  Patient has primary care physician Yes Alva Jewels, [place] W)  Home environment has been reviewed From home with spouse  Prior level of function: Independent  Prior/Current Home Services No current home services  Social Drivers of Health Review SDOH reviewed no interventions necessary  Readmission risk has been reviewed Yes  Transition of care needs transition of care needs identified, TOC will continue to follow (Surgery 01/20/24 scheduled- may have post op needs.)

## 2024-01-20 NOTE — Plan of Care (Signed)
  Problem: Clinical Measurements: Goal: Ability to maintain clinical measurements within normal limits will improve Outcome: Progressing   Problem: Coping: Goal: Level of anxiety will decrease Outcome: Progressing   Problem: Elimination: Goal: Will not experience complications related to urinary retention Outcome: Progressing   Problem: Pain Managment: Goal: General experience of comfort will improve and/or be controlled Outcome: Progressing   Problem: Safety: Goal: Ability to remain free from injury will improve Outcome: Progressing

## 2024-01-20 NOTE — Progress Notes (Signed)
 PROGRESS NOTE    Robert Clarke  HQI:[id number] DOB: [birth year] DOA: 01/18/2024 PCP: Jimmy Moulding, MD    No chief complaint on file.   Brief Narrative:  Patient is a 83 year old gentleman history of hypertension, GERD, hypothyroidism who was admitted to the hospital with right hip fracture.  Patient noted to have tripped over a hose on 12/27/2023 and hurt his right hip.  Patient was seen in the ED and also on follow-up at the outpatient urgent care where imaging was done and told he did not have a fracture.  Patient went on a cruise to [place] in the middle of May and had significant right hip pain with difficulty ambulating which was worsening and as such presented to the orthopedics urgent care at the direction of his PCP where imaging done revealed a right femur fracture.  Patient seen by orthopedics, Dr. Bernard Brick and admitted directly to the hospitalist service for planned surgical repair on 01/20/2024.   Assessment & Plan:   Principal Problem:   Closed right hip fracture, initial encounter [facility]) Active Problems:   Essential hypertension   Hypothyroidism   GERD (gastroesophageal reflux disease)   S/P total right hip arthroplasty   Hyponatremia   #1 right hip femoral neck fracture status post right total hip replacement. -Secondary to mechanical fall. -Patient seen in consultation by orthopedics and patient status post right total hip replacement through anterior approach per Dr. Bernard Brick, 01/20/2024.  - Pain management, DVT prophylaxis per orthopedics.  2.  Hypertension -Continue Avapro. - HCTZ discontinued.   3.  Hypothyroidism -Continue home regimen Synthroid .  4.  GERD - Continue PPI.  5.  Hyponatremia -Patient noted to be on HCTZ prior to admission which has been discontinued. - Gentle hydration. - Repeat labs in the AM.   DVT prophylaxis: Lovenox  Code Status: Full Family Communication: Updated patient.  No family at bedside.  Disposition: TBD  Status is:  Inpatient Remains inpatient appropriate because: Severity of illness   Consultants:  Orthopedics: Dr. Bernard Brick 01/19/2024  Procedures:  None  Antimicrobials:  Anti-infectives (From admission, onward)    Start     Dose/Rate Route Frequency Ordered Stop   01/20/24 1400  ceFAZolin  (ANCEF ) IVPB 2g/100 mL premix        2 g 200 mL/hr over 30 Minutes Intravenous Every 6 hours 01/20/24 1038 01/21/24 0159   01/20/24 0745  ceFAZolin  (ANCEF ) IVPB 2g/100 mL premix        2 g 200 mL/hr over 30 Minutes Intravenous On call to O.R. 01/20/24 0739 01/20/24 0739         Subjective: Patient seen in the PACU.  Just came out of the OR.  Denies any chest pain or shortness of breath.  No abdominal pain.  Objective: Vitals:   01/20/24 1000 01/20/24 1015 01/20/24 1035 01/20/24 1155  BP: 124/73 (!) 123/94 132/79 (!) 141/69  Pulse:  67 63 73  Resp: 17 13 16 16   Temp:   (!) 97 F (36.1 C) 97.8 F (36.6 C)  TempSrc:    Oral  SpO2: 96% 96% 96% 98%  Weight:      Height:        Intake/Output Summary (Last 24 hours) at 01/20/2024 1540 Last data filed at 01/20/2024 1527 Gross per 24 hour  Intake 2356 ml  Output 450 ml  Net 1906 ml   Filed Weights   01/18/24 1404 01/18/24 1410 01/20/24 0517  Weight: 108 kg 108 kg 104 kg    Examination:  General exam: NAD.  Respiratory system: Lungs clear to auscultation bilaterally anterior lung fields.  No wheezes, no crackles, no rhonchi.  Fair air movement.  Speaking in full sentences.   Cardiovascular system: Regular rate rhythm no murmurs rubs or gallops.  No JVD.  No lower extremity edema.   Gastrointestinal system: Abdomen is soft, nontender, nondistended, soft positive bowel sounds.  No rebound.  No guarding.  Central nervous system: Alert and oriented. No focal neurological deficits. Extremities: Right hip postop bandage in place.  Skin: No rashes, lesions or ulcers Psychiatry: Judgement and insight appear normal. Mood & affect appropriate.     Data  Reviewed: I have personally reviewed following labs and imaging studies  CBC: Recent Labs  Lab 01/19/24 0342 01/20/24 0342  WBC 6.7 9.5  NEUTROABS  --  6.4  HGB 14.0 14.4  HCT 44.6 44.4  MCV 91.4 89.7  PLT 253 291    Basic Metabolic Panel: Recent Labs  Lab 01/19/24 0342 01/20/24 0342  NA 131* 130*  K 4.6 4.0  CL 96* 95*  CO2 28 27  GLUCOSE 93 65*  BUN 22 24*  CREATININE 1.00 1.08  CALCIUM  9.2 9.2  MG  --  2.2  PHOS  --  3.2    GFR: Estimated Creatinine Clearance: 64.7 mL/min (by C-G formula based on SCr of 1.08 mg/dL).  Liver Function Tests: Recent Labs  Lab 01/20/24 0342  ALBUMIN 3.4*    CBG: No results for input(s): "GLUCAP" in the last 168 hours.   Recent Results (from the past 240 hours)  Surgical PCR screen     Status: None   Collection Time: 01/18/24  1:50 AM   Specimen: Nasal Mucosa; Nasal Swab  Result Value Ref Range Status   MRSA, PCR NEGATIVE NEGATIVE Final   Staphylococcus aureus NEGATIVE NEGATIVE Final    Comment: (NOTE) The Xpert SA Assay (FDA approved for NASAL specimens in patients 49 years of age and older), is one component of a comprehensive surveillance program. It is not intended to diagnose infection nor to guide or monitor treatment. Performed at [facility], [address] [place], [place] [address]          Radiology Studies: DG Pelvis Portable Result Date: 01/20/2024 CLINICAL DATA:  Status post hip arthroplasty. EXAM: PORTABLE PELVIS 1-2 VIEWS COMPARISON:  Preoperative imaging FINDINGS: Right hip arthroplasty in expected alignment. No periprosthetic lucency or fracture. Recent postsurgical change includes air and edema in the soft tissues. IMPRESSION: Right hip arthroplasty without immediate postoperative complication. Electronically Signed   By: Chadwick Colonel M.D.   On: 01/20/2024 11:31   DG HIP UNILAT WITH PELVIS 1V RIGHT Result Date: 01/20/2024 CLINICAL DATA:  Elective surgery. EXAM: DG HIP  (WITH OR WITHOUT PELVIS) 1V RIGHT COMPARISON:  Preoperative imaging FINDINGS: Eight fluoroscopic spot views of the pelvis and right hip obtained in the operating room. Sequential images during hip arthroplasty. Fluoroscopy time 12 seconds. Dose 2.03 mGy. IMPRESSION: Intraoperative fluoroscopy during right hip arthroplasty. Electronically Signed   By: Chadwick Colonel M.D.   On: 01/20/2024 09:57   DG C-Arm 1-60 Min-No Report Result Date: 01/20/2024 Fluoroscopy was utilized by the requesting physician.  No radiographic interpretation.        Scheduled Meds:  acetaminophen   1,000 mg Oral Q6H   aspirin   81 mg Oral BID   [START ON 01/21/2024] dexamethasone  (DECADRON ) injection  10 mg Intravenous Once   irbesartan  300 mg Oral Daily   levothyroxine   150 mcg Oral Q0600   mupirocin  ointment  1 Application Nasal BID   pantoprazole   40 mg Oral Daily   polyethylene glycol  17 g Oral BID   senna  2 tablet Oral QHS   sodium chloride  flush  3-10 mL Intravenous Q12H   Continuous Infusions:  sodium chloride  100 mL/hr at 01/20/24 1045    ceFAZolin  (ANCEF ) IV 2 g (01/20/24 1422)     LOS: 2 days    Time spent: 35 minutes    Hilda Lovings, MD [facility]   To contact the attending provider between 7A-7P or the covering provider during after hours 7P-7A, please log into the web site [email or web address] and access using universal [address] password for that web site. If you do not have the password, please call the hospital operator.  01/20/2024, 3:40 PM

## 2024-01-20 NOTE — Transfer of Care (Signed)
 Immediate Anesthesia Transfer of Care Note  Patient: Robert Clarke  Procedure(s) Performed: Procedure(s): ARTHROPLASTY, HIP, TOTAL, ANTERIOR APPROACH (Right)  Patient Location: PACU  Anesthesia Type:Spinal  Level of Consciousness:  sedated, patient cooperative and responds to stimulation  Airway & Oxygen Therapy:Patient Spontanous Breathing and Patient connected to face mask oxgen  Post-op Assessment:  Report given to PACU RN and Post -op Vital signs reviewed and stable  Post vital signs:  Reviewed and stable  Last Vitals:  Vitals:   01/20/24 0519 01/20/24 0655  BP: 118/64 119/69  Pulse: 65 (!) 59  Resp: 18 15  Temp: 36.7 C 36.6 C  SpO2: 96% 97%    Complications: No apparent anesthesia complications

## 2024-01-20 NOTE — Progress Notes (Signed)
 Orthopedic Tech Progress Note Patient Details:  Robert Clarke 1941/03/14 [id number]  Patient ID: Robert Clarke, male   DOB: 18-Dec-1940, 83 y.o.   MRN: [id number] No OHF. Age restricted. Naylea Wigington K Kendale Rembold 01/20/2024, 10:45 AM

## 2024-01-20 NOTE — Anesthesia Preprocedure Evaluation (Signed)
 Anesthesia Evaluation  Patient identified by MRN, date of birth, ID band Patient awake    Reviewed: Allergy & Precautions, NPO status , Patient's Chart, lab work & pertinent test results  Airway Mallampati: II  TM Distance: >3 FB Neck ROM: Full    Dental no notable dental hx.    Pulmonary neg pulmonary ROS   Pulmonary exam normal        Cardiovascular hypertension,  Rhythm:Regular Rate:Normal     Neuro/Psych  Headaches TIA negative psych ROS   GI/Hepatic Neg liver ROS,GERD  Medicated,,  Endo/Other  Hypothyroidism    Renal/GU negative Renal ROS  negative genitourinary   Musculoskeletal Hip fracture 2/2 fall   Abdominal Normal abdominal exam  (+)   Peds  Hematology Lab Results      Component                Value               Date                      WBC                      9.5                 01/20/2024                HGB                      14.4                01/20/2024                HCT                      44.4                01/20/2024                MCV                      89.7                01/20/2024                PLT                      291                 01/20/2024              Anesthesia Other Findings   Reproductive/Obstetrics                             Anesthesia Physical Anesthesia Plan  ASA: 2  Anesthesia Plan: MAC and Spinal   Post-op Pain Management:    Induction: Intravenous  PONV Risk Score and Plan: 1 and Ondansetron , Dexamethasone , Propofol  infusion and Treatment may vary due to age or medical condition  Airway Management Planned: Simple Face Mask and Nasal Cannula  Additional Equipment: None  Intra-op Plan:   Post-operative Plan:   Informed Consent: I have reviewed the patients History and Physical, chart, labs and discussed the procedure including the risks, benefits and alternatives for the proposed anesthesia with the patient or authorized  representative who has indicated his/her understanding and  acceptance.     Dental advisory given  Plan Discussed with: CRNA  Anesthesia Plan Comments:        Anesthesia Quick Evaluation

## 2024-01-20 NOTE — Plan of Care (Signed)

## 2024-01-20 NOTE — Anesthesia Procedure Notes (Signed)
 Spinal  Patient location during procedure: OR Start time: 01/20/2024 7:38 AM End time: 01/20/2024 7:40 AM Staffing Performed: anesthesiologist  Anesthesiologist: Micheal Agent, DO Performed by: Micheal Agent, DO Authorized by: Micheal Agent, DO   Preanesthetic Checklist Completed: patient identified, IV checked, site marked, risks and benefits discussed, surgical consent, monitors and equipment checked, pre-op evaluation and timeout performed Spinal Block Patient position: sitting Prep: DuraPrep Patient monitoring: heart rate, cardiac monitor, continuous pulse ox and blood pressure Approach: midline Location: L4-5 Injection technique: single-shot Needle Needle type: Pencan  Needle gauge: 24 G Needle length: 10 cm Assessment Events: CSF return Additional Notes Patient identified. Risks/Benefits/Options discussed with patient including but not limited to bleeding, infection, nerve damage, paralysis, failed block, incomplete pain control, headache, blood pressure changes, nausea, vomiting, reactions to medications, itching and postpartum back pain. Confirmed with bedside nurse the patient's most recent platelet count. Confirmed with patient that they are not currently taking any anticoagulation, have any bleeding history or any family history of bleeding disorders. Patient expressed understanding and wished to proceed. All questions were answered. Sterile technique was used throughout the entire procedure. Please see nursing notes for vital signs. Warning signs of high block given to the patient including shortness of breath, tingling/numbness in hands, complete motor block, or any concerning symptoms with instructions to call for help. Patient was given instructions on fall risk and not to get out of bed. All questions and concerns addressed with instructions to call with any issues or inadequate analgesia.

## 2024-01-20 NOTE — Discharge Instructions (Signed)

## 2024-01-21 ENCOUNTER — Inpatient Hospital Stay (HOSPITAL_COMMUNITY)

## 2024-01-21 DIAGNOSIS — N390 Urinary tract infection, site not specified: Secondary | ICD-10-CM | POA: Clinically undetermined

## 2024-01-21 DIAGNOSIS — D72829 Elevated white blood cell count, unspecified: Secondary | ICD-10-CM | POA: Diagnosis not present

## 2024-01-21 DIAGNOSIS — S72001A Fracture of unspecified part of neck of right femur, initial encounter for closed fracture: Secondary | ICD-10-CM | POA: Diagnosis not present

## 2024-01-21 DIAGNOSIS — E871 Hypo-osmolality and hyponatremia: Secondary | ICD-10-CM | POA: Diagnosis not present

## 2024-01-21 DIAGNOSIS — N3000 Acute cystitis without hematuria: Secondary | ICD-10-CM

## 2024-01-21 DIAGNOSIS — E039 Hypothyroidism, unspecified: Secondary | ICD-10-CM | POA: Diagnosis not present

## 2024-01-21 DIAGNOSIS — I10 Essential (primary) hypertension: Secondary | ICD-10-CM | POA: Diagnosis not present

## 2024-01-21 LAB — URINALYSIS, COMPLETE (UACMP) WITH MICROSCOPIC
Bacteria, UA: NONE SEEN
Bilirubin Urine: NEGATIVE
Glucose, UA: NEGATIVE mg/dL
Ketones, ur: NEGATIVE mg/dL
Nitrite: NEGATIVE
Protein, ur: NEGATIVE mg/dL
Specific Gravity, Urine: 1.026 (ref 1.005–1.030)
pH: 5 (ref 5.0–8.0)

## 2024-01-21 LAB — BASIC METABOLIC PANEL WITH GFR
Anion gap: 9 (ref 5–15)
BUN: 25 mg/dL — ABNORMAL HIGH (ref 8–23)
CO2: 25 mmol/L (ref 22–32)
Calcium: 8.7 mg/dL — ABNORMAL LOW (ref 8.9–10.3)
Chloride: 94 mmol/L — ABNORMAL LOW (ref 98–111)
Creatinine, Ser: 1.11 mg/dL (ref 0.61–1.24)
GFR, Estimated: >60 mL/min (ref >60)
Glucose, Bld: 117 mg/dL — ABNORMAL HIGH (ref 70–99)
Potassium: 4.5 mmol/L (ref 3.5–5.1)
Sodium: 128 mmol/L — ABNORMAL LOW (ref 135–145)

## 2024-01-21 LAB — CBC
HCT: 38.1 % — ABNORMAL LOW (ref 39.0–52.0)
Hemoglobin: 12.1 g/dL — ABNORMAL LOW (ref 13.0–17.0)
MCH: 29 pg (ref 26.0–34.0)
MCHC: 31.8 g/dL (ref 30.0–36.0)
MCV: 91.4 fL (ref 80.0–100.0)
Platelets: 235 10*3/uL (ref 150–400)
RBC: 4.17 MIL/uL — ABNORMAL LOW (ref 4.22–5.81)
RDW: 13.2 % (ref 11.5–15.5)
WBC: 14 10*3/uL — ABNORMAL HIGH (ref 4.0–10.5)
nRBC: 0 % (ref 0.0–0.2)

## 2024-01-21 MED ORDER — SENNOSIDES-DOCUSATE SODIUM 8.6-50 MG PO TABS
1.0000 | ORAL_TABLET | Freq: Two times a day (BID) | ORAL | Status: DC
  Administered 2024-01-21 – 2024-01-22 (×3): 1 via ORAL
  Filled 2024-01-21 (×3): qty 1

## 2024-01-21 MED ORDER — SODIUM CHLORIDE 0.9 % IV SOLN: INTRAVENOUS | Status: DC

## 2024-01-21 MED ORDER — SODIUM CHLORIDE 0.9 % IV SOLN
2.0000 g | INTRAVENOUS | Status: DC
  Administered 2024-01-21 – 2024-01-22 (×2): 2 g via INTRAVENOUS
  Filled 2024-01-21 (×2): qty 20

## 2024-01-21 NOTE — Progress Notes (Signed)
 Patient ID: Robert Clarke, male   DOB: [birth year], 83 y.o.   MRN: [id number] Subjective: 1 Day Post-Op Procedure(s) (LRB): ARTHROPLASTY, HIP, TOTAL, ANTERIOR APPROACH (Right)    Patient reports pain as moderate.  He has been challenged with thigh pain and cramping sensation with movement.  He was not seen by physical therapy yesterday and has yet to be out of bed.  Objective:   VITALS:   Vitals:   01/21/24 0058 01/21/24 0520  BP: 121/71 134/73  Pulse: 71 71  Resp: 18 18  Temp: 98.2 F (36.8 C) (!) 97.5 F (36.4 C)  SpO2: 95% 96%    Neurovascular intact Incision: dressing C/D/I -right hip  LABS Recent Labs    01/19/24 0342 01/20/24 0342 01/21/24 0354  HGB 14.0 14.4 12.1*  HCT 44.6 44.4 38.1*  WBC 6.7 9.5 14.0*  PLT 253 291 235    Recent Labs    01/19/24 0342 01/20/24 0342 01/21/24 0354  NA 131* 130* 128*  K 4.6 4.0 4.5  BUN 22 24* 25*  CREATININE 1.00 1.08 1.11  GLUCOSE 93 65* 117*    No results for input(s): "LABPT", "INR" in the last 72 hours.   Assessment/Plan: 1 Day Post-Op Procedure(s) (LRB): ARTHROPLASTY, HIP, TOTAL, ANTERIOR APPROACH (Right)   Advance diet Up with therapy - WBAT RTC in 2 weeks ASA for DVT prophylaxis twice daily Should be able to tolerate oxycodone  for pain control as well as Robaxin as supplemental muscle relaxant.  MiraLAX and Senokot should be sent for constipation prophylaxis.

## 2024-01-21 NOTE — TOC Initial Note (Signed)
 Transition of Care [facility]) - Initial/Assessment Note    Patient Details  Name: Robert Clarke MRN: [id number] Date of Birth: 30-Jun-1941  Transition of Care [facility]) CM/SW Contact:    Bari Leys, RN Phone Number: 01/21/2024, 10:03 AM  Clinical Narrative:   Met with patient at bedside to introduce role of TOC/NCM and review for dc planning, pt confirmed he has an established PCP, no current home care services, has a standard walker ( no RW), reports he resides with his spouse and feels safe returning home. PT eval, await recommendation. TOC will continue to follow.                 Expected Discharge Plan: Home w Home Health Services Barriers to Discharge: Continued Medical Work up   Patient Goals and CMS Choice Patient states their goals for this hospitalization and ongoing recovery are:: return home          Expected Discharge Plan and Services       Living arrangements for the past 2 months: Single Family Home                                      Prior Living Arrangements/Services Living arrangements for the past 2 months: Single Family Home Lives with:: Spouse Patient language and need for interpreter reviewed:: Yes Do you feel safe going back to the place where you live?: Yes      Need for Family Participation in Patient Care: Yes (Comment) Care giver support system in place?: Yes (comment) Current home services: DME (standard walker) Criminal Activity/Legal Involvement Pertinent to Current Situation/Hospitalization: No - Comment as needed  Activities of Daily Living   ADL Screening (condition at time of admission) Independently performs ADLs?: Yes (appropriate for developmental age) Is the patient deaf or have difficulty hearing?: No Does the patient have difficulty seeing, even when wearing glasses/contacts?: No Does the patient have difficulty concentrating, remembering, or making decisions?: No  Permission Sought/Granted                  Emotional  Assessment Appearance:: Appears stated age Attitude/Demeanor/Rapport: Engaged Affect (typically observed): Accepting Orientation: : Oriented to Self, Oriented to Place, Oriented to  Time, Oriented to Situation Alcohol / Substance Use: Not Applicable Psych Involvement: No (comment)  Admission diagnosis:  Hip fx, right, closed, initial encounter (HCC) [S72.001A] Closed right hip fracture, initial encounter (HCC) [S72.001A] Patient Active Problem List   Diagnosis Date Noted   S/P total right hip arthroplasty 01/20/2024   Hyponatremia 01/20/2024   Essential hypertension 01/19/2024   Hypothyroidism 01/19/2024   GERD (gastroesophageal reflux disease) 01/19/2024   Closed right hip fracture, initial encounter (HCC) 01/18/2024   TIA (transient ischemic attack) 01/16/2019   SUNCT (short unilateral neuralgiform headache, conjunctival inj/tear) 09/12/2018   Basal cell carcinoma of nose 08/06/2014   PCP:  Jimmy Moulding, MD Pharmacy:   [facility] [phone] - [place], [place] - [address] [place] [address] Phone: [phone] Fax: [phone]     Social Drivers of Health (SDOH) Social History: SDOH Screenings   Food Insecurity: No Food Insecurity (01/18/2024)  Housing: Low Risk  (01/18/2024)  Transportation Needs: No Transportation Needs (01/18/2024)  Utilities: Not At Risk (01/18/2024)  Financial Resource Strain: Low Risk  (01/15/2024)   Received from [facility]  Social Connections: Socially Integrated (01/18/2024)  Tobacco Use: Low Risk  (01/20/2024)  SDOH Interventions:     Readmission Risk Interventions    01/21/2024   10:02 AM  Readmission Risk Prevention Plan  Post Dischage Appt Complete  Medication Screening Complete  Transportation Screening Complete

## 2024-01-21 NOTE — Progress Notes (Signed)
 Physical Therapy Treatment Patient Details Name: Robert Clarke MRN: [id number] DOB: 26-Jul-1941 Today's Date: 01/21/2024   History of Present Illness 83 y.o. male tripped over a hose in his yard on 12/27/2023 and hurt his right hip.  He went to the ER that day, also follow-up later with outpatient urgent care and was told that he did not have any fracture.  He actually went on a cruise to [place] in the middle of May, he was able to ambulate better, but continued to have pain so he fully went to Ortho urgent care today, where x-ray imaging revealed what appears to be a right femur fracture. pt is now s/p R DA THA on 01/20/24.  PMH: hypertension, GERD, hypothyroidism    PT Comments  Pt continues to progress well, improving fluidity of gait, pain controlled, HEP initiated. Will continue to follow in acute setting   If plan is discharge home, recommend the following: Help with stairs or ramp for entrance   Can travel by private vehicle        Equipment Recommendations  Rolling walker (2 wheels)    Recommendations for Other Services       Precautions / Restrictions Precautions Precautions: Fall Recall of Precautions/Restrictions: Intact Precaution/Restrictions Comments: WBAT Restrictions Weight Bearing Restrictions Per Provider Order: No     Mobility  Bed Mobility Overal bed mobility: Needs Assistance Bed Mobility: Supine to Sit     Supine to sit: Supervision, HOB elevated     General bed mobility comments: for safety    Transfers Overall transfer level: Needs assistance Equipment used: Rolling walker (2 wheels) Transfers: Sit to/from Stand Sit to Stand: Contact guard assist, Supervision           General transfer comment: cues for hand placement    Ambulation/Gait Ambulation/Gait assistance: Contact guard assist, Supervision Gait Distance (Feet): 160 Feet Assistive device: Rolling walker (2 wheels) Gait Pattern/deviations: Step-to pattern, Step-through pattern        General Gait Details: cues for RW position, step length   Stairs             Wheelchair Mobility     Tilt Bed    Modified Rankin (Stroke Patients Only)       Balance Overall balance assessment: No apparent balance deficits (not formally assessed)                                          Communication Communication Communication: No apparent difficulties  Cognition Arousal: Alert Behavior During Therapy: WFL for tasks assessed/performed   PT - Cognitive impairments: No apparent impairments                         Following commands: Intact      Cueing Cueing Techniques: Verbal cues  Exercises Total Joint Exercises Long Arc Quad: AROM, Strengthening, Right, 10 reps    General Comments        Pertinent Vitals/Pain Pain Assessment Pain Assessment: No/denies pain    Home Living Family/patient expects to be discharged to:: Private residence Living Arrangements: Spouse/significant other Available Help at Discharge: Family;Available 24 hours/day Type of Home: House Home Access: Stairs to enter Entrance Stairs-Rails: Right Entrance Stairs-Number of Steps: 2   Home Layout: Two level;Able to live on main level with bedroom/bathroom Home Equipment: Standard Walker;Cane - single point      Prior Function  PT Goals (current goals can now be found in the care plan section) Acute Rehab PT Goals PT Goal Formulation: With patient Time For Goal Achievement: 01/28/24 Potential to Achieve Goals: Good Progress towards PT goals: Progressing toward goals    Frequency    7X/week      PT Plan      Co-evaluation              AM-PAC PT "6 Clicks" Mobility   Outcome Measure  Help needed turning from your back to your side while in a flat bed without using bedrails?: None Help needed moving from lying on your back to sitting on the side of a flat bed without using bedrails?: None Help needed moving to and from  a bed to a chair (including a wheelchair)?: A Little Help needed standing up from a chair using your arms (e.g., wheelchair or bedside chair)?: A Little Help needed to walk in hospital room?: A Little Help needed climbing 3-5 steps with a railing? : A Little 6 Click Score: 20    End of Session Equipment Utilized During Treatment: Gait belt Activity Tolerance: Patient tolerated treatment well Patient left: with call bell/phone within reach;in chair;with chair alarm set;with family/visitor present Nurse Communication: Mobility status PT Visit Diagnosis: Other abnormalities of gait and mobility (R26.89)     Time: 1340-1402 PT Time Calculation (min) (ACUTE ONLY): 22 min  Charges:    $Gait Training: 8-22 mins PT General Charges $$ ACUTE PT VISIT: 1 Visit                     Megin Consalvo, PT  Acute Rehab Dept ([facility]) [phone]  01/21/2024    [facility] 01/21/2024, 2:06 PM

## 2024-01-21 NOTE — Progress Notes (Signed)
 PROGRESS NOTE    Robert Clarke  ZOX:[id number] DOB: [birth year] DOA: 01/18/2024 PCP: Jimmy Moulding, MD    No chief complaint on file.   Brief Narrative:  Patient is a 83 year old gentleman history of hypertension, GERD, hypothyroidism who was admitted to the hospital with right hip fracture.  Patient noted to have tripped over a hose on 12/27/2023 and hurt his right hip.  Patient was seen in the ED and also on follow-up at the outpatient urgent care where imaging was done and told he did not have a fracture.  Patient went on a cruise to [place] in the middle of May and had significant right hip pain with difficulty ambulating which was worsening and as such presented to the orthopedics urgent care at the direction of his PCP where imaging done revealed a right femur fracture.  Patient seen by orthopedics, Dr. Bernard Brick and admitted directly to the hospitalist service for planned surgical repair on 01/20/2024.   Assessment & Plan:   Principal Problem:   Closed right hip fracture, initial encounter [facility]) Active Problems:   Essential hypertension   Hypothyroidism   GERD (gastroesophageal reflux disease)   S/P total right hip arthroplasty   Hyponatremia   UTI (urinary tract infection)   Leukocytosis   #1 right hip femoral neck fracture status post right total hip replacement. -Secondary to mechanical fall. -Patient seen in consultation by orthopedics and patient status post right total hip replacement through anterior approach per Dr. Bernard Brick, 01/20/2024.  - Pain management, DVT prophylaxis per orthopedics.  2.  Hypertension -Continue Avapro. - HCTZ discontinued.   3.  Hypothyroidism - Synthroid .  4.  GERD - PPI.  5.  Hyponatremia -Patient noted to be on HCTZ prior to admission which has been discontinued. - Gentle hydration. -Will likely not resume HCTZ on discharge. - Repeat labs in the AM.  6.  Probable UTI -Urinalysis with moderate leukocytes, nitrite negative, 21-50 WBCs. -  Urine cultures pending. - IV Rocephin.  7.  Leukocytosis -Questionable etiology. -Patient noted to have received a dose of IV Lasix yesterday perioperatively. - Chest x-ray negative for any acute infiltrates. - Urinalysis concerning for UTI. - Urine cultures pending. - Place on IV Rocephin.   DVT prophylaxis: Lovenox  Code Status: Full Family Communication: Updated patient and wife at bedside..  No family at bedside.  Disposition: Home with home health  Status is: Inpatient Remains inpatient appropriate because: Severity of illness   Consultants:  Orthopedics: Dr. Bernard Brick 01/19/2024  Procedures:  Right total hip replacement through anterior approach per orthopedics: Dr. Olin 01/20/2024  Antimicrobials:  Anti-infectives (From admission, onward)    Start     Dose/Rate Route Frequency Ordered Stop   01/21/24 1400  cefTRIAXone (ROCEPHIN) 2 g in sodium chloride  0.9 % 100 mL IVPB        2 g 200 mL/hr over 30 Minutes Intravenous Every 24 hours 01/21/24 1232     01/20/24 1400  ceFAZolin  (ANCEF ) IVPB 2g/100 mL premix        2 g 200 mL/hr over 30 Minutes Intravenous Every 6 hours 01/20/24 1038 01/21/24 1007   01/20/24 0745  ceFAZolin  (ANCEF ) IVPB 2g/100 mL premix        2 g 200 mL/hr over 30 Minutes Intravenous On call to O.R. 01/20/24 0739 01/20/24 0739         Subjective: Patient sitting up in bed.  Overall feeling better.  Denies any chest pain or shortness of breath.  No abdominal pain.  Denies any dysuria.  Feels right hip pain improved.  States able to move his right lower extremity now and was able to work with physical therapy early on.  Wife at bedside.    Objective: Vitals:   01/21/24 0058 01/21/24 0520 01/21/24 1010 01/21/24 1434  BP: 121/71 134/73 (!) 142/54 125/70  Pulse: 71 71 72 73  Resp: 18 18 17 17   Temp: 98.2 F (36.8 C) (!) 97.5 F (36.4 C) 97.8 F (36.6 C) 98.1 F (36.7 C)  TempSrc: Oral Oral  Oral  SpO2: 95% 96% 98% 98%  Weight:      Height:         Intake/Output Summary (Last 24 hours) at 01/21/2024 1702 Last data filed at 01/21/2024 1400 Gross per 24 hour  Intake 2460 ml  Output 1050 ml  Net 1410 ml   Filed Weights   01/18/24 1404 01/18/24 1410 01/20/24 0517  Weight: 108 kg 108 kg 104 kg    Examination:  General exam: NAD. Respiratory system: CTAB.  No wheezes, no crackles, no rhonchi.  Fair air movement.  Speaking in full sentences.    Cardiovascular system: RRR no murmurs rubs or gallops.  No JVD.  No pitting lower extremity edema.   Gastrointestinal system: Abdomen is soft, nontender, nondistended, positive bowel sounds.  No rebound.  No guarding.  Central nervous system: Alert and oriented. No focal neurological deficits. Extremities: Right hip postop bandage in place.  Skin: No rashes, lesions or ulcers Psychiatry: Judgement and insight appear normal. Mood & affect appropriate.     Data Reviewed: I have personally reviewed following labs and imaging studies  CBC: Recent Labs  Lab 01/19/24 0342 01/20/24 0342 01/21/24 0354  WBC 6.7 9.5 14.0*  NEUTROABS  --  6.4  --   HGB 14.0 14.4 12.1*  HCT 44.6 44.4 38.1*  MCV 91.4 89.7 91.4  PLT 253 291 235    Basic Metabolic Panel: Recent Labs  Lab 01/19/24 0342 01/20/24 0342 01/21/24 0354  NA 131* 130* 128*  K 4.6 4.0 4.5  CL 96* 95* 94*  CO2 28 27 25   GLUCOSE 93 65* 117*  BUN 22 24* 25*  CREATININE 1.00 1.08 1.11  CALCIUM  9.2 9.2 8.7*  MG  --  2.2  --   PHOS  --  3.2  --     GFR: Estimated Creatinine Clearance: 63 mL/min (by C-G formula based on SCr of 1.11 mg/dL).  Liver Function Tests: Recent Labs  Lab 01/20/24 0342  ALBUMIN 3.4*    CBG: No results for input(s): "GLUCAP" in the last 168 hours.   Recent Results (from the past 240 hours)  Surgical PCR screen     Status: None   Collection Time: 01/18/24  1:50 AM   Specimen: Nasal Mucosa; Nasal Swab  Result Value Ref Range Status   MRSA, PCR NEGATIVE NEGATIVE Final   Staphylococcus  aureus NEGATIVE NEGATIVE Final    Comment: (NOTE) The Xpert SA Assay (FDA approved for NASAL specimens in patients 77 years of age and older), is one component of a comprehensive surveillance program. It is not intended to diagnose infection nor to guide or monitor treatment. Performed at [facility], [address], [place], [place] [address]          Radiology Studies: DG CHEST PORT 1 VIEW Result Date: 01/21/2024 CLINICAL DATA:  100030 Leukocytosis 100030 EXAM: PORTABLE CHEST 1 VIEW COMPARISON:  April twenty-ninth 2024 FINDINGS: The cardiomediastinal silhouette is unchanged in contour.Atherosclerotic calcifications of the tortuous thoracic aorta.  No pleural effusion. No pneumothorax. No acute pleuroparenchymal abnormality. IMPRESSION: No acute cardiopulmonary abnormality. Electronically Signed   By: Clancy Crimes M.D.   On: 01/21/2024 11:20   DG Pelvis Portable Result Date: 01/20/2024 CLINICAL DATA:  Status post hip arthroplasty. EXAM: PORTABLE PELVIS 1-2 VIEWS COMPARISON:  Preoperative imaging FINDINGS: Right hip arthroplasty in expected alignment. No periprosthetic lucency or fracture. Recent postsurgical change includes air and edema in the soft tissues. IMPRESSION: Right hip arthroplasty without immediate postoperative complication. Electronically Signed   By: Chadwick Colonel M.D.   On: 01/20/2024 11:31   DG HIP UNILAT WITH PELVIS 1V RIGHT Result Date: 01/20/2024 CLINICAL DATA:  Elective surgery. EXAM: DG HIP (WITH OR WITHOUT PELVIS) 1V RIGHT COMPARISON:  Preoperative imaging FINDINGS: Eight fluoroscopic spot views of the pelvis and right hip obtained in the operating room. Sequential images during hip arthroplasty. Fluoroscopy time 12 seconds. Dose 2.03 mGy. IMPRESSION: Intraoperative fluoroscopy during right hip arthroplasty. Electronically Signed   By: Chadwick Colonel M.D.   On: 01/20/2024 09:57   DG C-Arm 1-60 Min-No Report Result Date:  01/20/2024 Fluoroscopy was utilized by the requesting physician.  No radiographic interpretation.        Scheduled Meds:  acetaminophen   1,000 mg Oral Q6H   aspirin   81 mg Oral BID   irbesartan   300 mg Oral Daily   levothyroxine   150 mcg Oral Q0600   mupirocin  ointment  1 Application Nasal BID   pantoprazole   40 mg Oral Daily   polyethylene glycol  17 g Oral BID   senna-docusate  1 tablet Oral BID   sodium chloride  flush  3-10 mL Intravenous Q12H   Continuous Infusions:  sodium chloride  100 mL/hr at 01/21/24 1610   cefTRIAXone  (ROCEPHIN )  IV 2 g (01/21/24 1436)     LOS: 3 days    Time spent: 40 minutes    Hilda Lovings, MD [facility]   To contact the attending provider between 7A-7P or the covering provider during after hours 7P-7A, please log into the web site [email or web address] and access using universal [place] password for that web site. If you do not have the password, please call the hospital operator.  01/21/2024, 5:02 PM

## 2024-01-21 NOTE — Evaluation (Signed)
 Physical Therapy Evaluation Patient Details Name: Robert Clarke MRN: [id number] DOB: 01/20/1941 Today's Date: 01/21/2024  History of Present Illness  83 y.o. male tripped over a hose in his yard on 12/27/2023 and hurt his right hip.  He went to the ER that day, also follow-up later with outpatient urgent care and was told that he did not have any fracture.  He actually went on a cruise to [place] in the middle of May, he was able to ambulate better, but continued to have pain so he fully went to Ortho urgent care today, where x-ray imaging revealed what appears to be a right femur fracture. pt is now s/p R DA THA on 01/20/24.  PMH: hypertension, GERD, hypothyroidism  Clinical Impression  Pt admitted with above diagnosis.  Pt is doing well at time of PT eval, pain controlled; amb ~ 200' with RW and CGA to supervision. Anticipate steady progress and will follow in acute setting.   Pt currently with functional limitations due to the deficits listed below (see PT Problem List). Pt will benefit from acute skilled PT to increase their independence and safety with mobility to allow discharge.           If plan is discharge home, recommend the following: Help with stairs or ramp for entrance   Can travel by private vehicle        Equipment Recommendations Rolling walker (2 wheels)  Recommendations for Other Services       Functional Status Assessment Patient has had a recent decline in their functional status and demonstrates the ability to make significant improvements in function in a reasonable and predictable amount of time.     Precautions / Restrictions Precautions Precautions: Fall Precaution/Restrictions Comments: WBAT Restrictions Weight Bearing Restrictions Per Provider Order: No      Mobility  Bed Mobility Overal bed mobility: Needs Assistance Bed Mobility: Supine to Sit     Supine to sit: Supervision, HOB elevated     General bed mobility comments: for safety     Transfers Overall transfer level: Needs assistance Equipment used: Rolling walker (2 wheels) Transfers: Sit to/from Stand Sit to Stand: Contact guard assist, Supervision           General transfer comment: cues for hand placement    Ambulation/Gait Ambulation/Gait assistance: Contact guard assist, Supervision Gait Distance (Feet): 200 Feet Assistive device: Rolling walker (2 wheels) Gait Pattern/deviations: Step-to pattern, Step-through pattern       General Gait Details: cues for RW use, progression to step through pattern. good stability, no LOB, wt shift to RLE improved with distance  Stairs            Wheelchair Mobility     Tilt Bed    Modified Rankin (Stroke Patients Only)       Balance Overall balance assessment: No apparent balance deficits (not formally assessed)                                           Pertinent Vitals/Pain Pain Assessment Pain Assessment: No/denies pain    Home Living Family/patient expects to be discharged to:: Private residence Living Arrangements: Spouse/significant other Available Help at Discharge: Family;Available 24 hours/day Type of Home: House Home Access: Stairs to enter Entrance Stairs-Rails: Right Entrance Stairs-Number of Steps: 2   Home Layout: Two level;Able to live on main level with bedroom/bathroom Home Equipment: Standard Walker;Cane - single  point      Prior Function Prior Level of Function : Independent/Modified Independent                     Extremity/Trunk Assessment   Upper Extremity Assessment Upper Extremity Assessment: Overall WFL for tasks assessed    Lower Extremity Assessment Lower Extremity Assessment: RLE deficits/detail RLE Deficits / Details: grossly 3 to 3+/5 hip, knee 4+/5. ankle WFL; AAROM WFL; anticipated post op changes       Communication        Cognition Arousal: Alert Behavior During Therapy: WFL for tasks assessed/performed   PT -  Cognitive impairments: No apparent impairments                         Following commands: Intact       Cueing       General Comments      Exercises     Assessment/Plan    PT Assessment Patient needs continued PT services  PT Problem List Decreased strength;Decreased activity tolerance;Decreased balance;Decreased knowledge of use of DME;Decreased mobility       PT Treatment Interventions DME instruction;Gait training;Functional mobility training;Stair training;Therapeutic activities;Patient/family education;Therapeutic exercise    PT Goals (Current goals can be found in the Care Plan section)  Acute Rehab PT Goals PT Goal Formulation: With patient Time For Goal Achievement: 01/28/24 Potential to Achieve Goals: Good    Frequency 7X/week     Co-evaluation               AM-PAC PT "6 Clicks" Mobility  Outcome Measure Help needed turning from your back to your side while in a flat bed without using bedrails?: None Help needed moving from lying on your back to sitting on the side of a flat bed without using bedrails?: None Help needed moving to and from a bed to a chair (including a wheelchair)?: A Little Help needed standing up from a chair using your arms (e.g., wheelchair or bedside chair)?: A Little Help needed to walk in hospital room?: A Little Help needed climbing 3-5 steps with a railing? : A Little 6 Click Score: 20    End of Session Equipment Utilized During Treatment: Gait belt Activity Tolerance: Patient tolerated treatment well Patient left: with call bell/phone within reach;in chair;with chair alarm set Nurse Communication: Mobility status PT Visit Diagnosis: Other abnormalities of gait and mobility (R26.89)    Time: 1040-1105 PT Time Calculation (min) (ACUTE ONLY): 25 min   Charges:     PT Treatments $Gait Training: 8-22 mins PT General Charges $$ ACUTE PT VISIT: 1 Visit         Alexandria Ida, PT  Acute Rehab Dept [facility])  [phone]  01/21/2024   [facility] 01/21/2024, 11:17 AM

## 2024-01-22 ENCOUNTER — Other Ambulatory Visit (HOSPITAL_COMMUNITY): Payer: Self-pay

## 2024-01-22 ENCOUNTER — Encounter (HOSPITAL_COMMUNITY): Payer: Self-pay | Admitting: Orthopedic Surgery

## 2024-01-22 DIAGNOSIS — I10 Essential (primary) hypertension: Secondary | ICD-10-CM | POA: Diagnosis not present

## 2024-01-22 DIAGNOSIS — E871 Hypo-osmolality and hyponatremia: Secondary | ICD-10-CM | POA: Diagnosis not present

## 2024-01-22 DIAGNOSIS — Z96641 Presence of right artificial hip joint: Secondary | ICD-10-CM | POA: Diagnosis not present

## 2024-01-22 DIAGNOSIS — R8271 Bacteriuria: Secondary | ICD-10-CM

## 2024-01-22 DIAGNOSIS — S72001A Fracture of unspecified part of neck of right femur, initial encounter for closed fracture: Secondary | ICD-10-CM | POA: Diagnosis not present

## 2024-01-22 LAB — BASIC METABOLIC PANEL WITH GFR
Anion gap: 5 (ref 5–15)
Anion gap: 8 (ref 5–15)
BUN: 18 mg/dL (ref 8–23)
BUN: 17 mg/dL (ref 8–23)
CO2: 25 mmol/L (ref 22–32)
CO2: 26 mmol/L (ref 22–32)
Calcium: 8.2 mg/dL — ABNORMAL LOW (ref 8.9–10.3)
Calcium: 8.9 mg/dL (ref 8.9–10.3)
Chloride: 96 mmol/L — ABNORMAL LOW (ref 98–111)
Chloride: 98 mmol/L (ref 98–111)
Creatinine, Ser: 0.88 mg/dL (ref 0.61–1.24)
Creatinine, Ser: 1.06 mg/dL (ref 0.61–1.24)
GFR, Estimated: >60 mL/min (ref >60)
GFR, Estimated: >60 mL/min (ref >60)
Glucose, Bld: 106 mg/dL — ABNORMAL HIGH (ref 70–99)
Glucose, Bld: 108 mg/dL — ABNORMAL HIGH (ref 70–99)
Potassium: 4.1 mmol/L (ref 3.5–5.1)
Potassium: 3.9 mmol/L (ref 3.5–5.1)
Sodium: 126 mmol/L — ABNORMAL LOW (ref 135–145)
Sodium: 132 mmol/L — ABNORMAL LOW (ref 135–145)

## 2024-01-22 LAB — OSMOLALITY, URINE: Osmolality, Ur: 327 mosm/kg (ref 300–900)

## 2024-01-22 LAB — CBC
HCT: 35.8 % — ABNORMAL LOW (ref 39.0–52.0)
Hemoglobin: 11.6 g/dL — ABNORMAL LOW (ref 13.0–17.0)
MCH: 29.4 pg (ref 26.0–34.0)
MCHC: 32.4 g/dL (ref 30.0–36.0)
MCV: 90.9 fL (ref 80.0–100.0)
Platelets: 210 10*3/uL (ref 150–400)
RBC: 3.94 MIL/uL — ABNORMAL LOW (ref 4.22–5.81)
RDW: 13.2 % (ref 11.5–15.5)
WBC: 11.4 10*3/uL — ABNORMAL HIGH (ref 4.0–10.5)
nRBC: 0 % (ref 0.0–0.2)

## 2024-01-22 LAB — SODIUM, URINE, RANDOM: Sodium, Ur: 77 mmol/L

## 2024-01-22 LAB — TSH: TSH: 1.107 u[IU]/mL (ref 0.350–4.500)

## 2024-01-22 LAB — OSMOLALITY: Osmolality: 279 mosm/kg (ref 275–295)

## 2024-01-22 MED ORDER — OXYCODONE HCL 5 MG PO TABS
5.0000 mg | ORAL_TABLET | ORAL | 0 refills | Status: AC | PRN
  Filled 2024-01-22: qty 15, 3d supply, fill #0

## 2024-01-22 MED ORDER — SODIUM CHLORIDE 1 G PO TABS
1.0000 g | ORAL_TABLET | Freq: Two times a day (BID) | ORAL | 0 refills | Status: AC
  Filled 2024-01-22: qty 6, 3d supply, fill #0

## 2024-01-22 MED ORDER — ASPIRIN 81 MG PO TBEC: 81.0000 mg | DELAYED_RELEASE_TABLET | Freq: Two times a day (BID) | ORAL | Status: AC

## 2024-01-22 MED ORDER — SODIUM CHLORIDE 1 G PO TABS
2.0000 g | ORAL_TABLET | Freq: Three times a day (TID) | ORAL | Status: DC
  Administered 2024-01-22 (×2): 2 g via ORAL
  Filled 2024-01-22 (×3): qty 2

## 2024-01-22 MED ORDER — POLYETHYLENE GLYCOL 3350 17 GM/SCOOP PO POWD
17.0000 g | Freq: Two times a day (BID) | ORAL | 0 refills | Status: AC
  Filled 2024-01-22: qty 238, 7d supply, fill #0

## 2024-01-22 MED ORDER — SENNOSIDES-DOCUSATE SODIUM 8.6-50 MG PO TABS: 1.0000 | ORAL_TABLET | Freq: Two times a day (BID) | ORAL | Status: AC

## 2024-01-22 MED ORDER — ACETAMINOPHEN 500 MG PO TABS: 1000.0000 mg | ORAL_TABLET | Freq: Four times a day (QID) | ORAL | Status: AC

## 2024-01-22 MED ORDER — IRBESARTAN 300 MG PO TABS
300.0000 mg | ORAL_TABLET | Freq: Every day | ORAL | 0 refills | Status: AC
  Filled 2024-01-22: qty 30, 30d supply, fill #0

## 2024-01-22 MED ORDER — METHOCARBAMOL 500 MG PO TABS
500.0000 mg | ORAL_TABLET | Freq: Four times a day (QID) | ORAL | 0 refills | Status: AC | PRN
  Filled 2024-01-22: qty 20, 5d supply, fill #0

## 2024-01-22 MED ORDER — FUROSEMIDE 20 MG PO TABS
20.0000 mg | ORAL_TABLET | Freq: Once | ORAL | Status: AC
  Administered 2024-01-22: 20 mg via ORAL
  Filled 2024-01-22: qty 1

## 2024-01-22 NOTE — Progress Notes (Signed)
 Physical Therapy Treatment Patient Details Name: Robert Clarke MRN: [id number] DOB: Apr 26, 1941 Today's Date: 01/22/2024   History of Present Illness 83 y.o. male tripped over a hose in his yard on 12/27/2023 and hurt his right hip.  He went to the ER that day, also follow-up later with outpatient urgent care and was told that he did not have any fracture.  He actually went on a cruise to [place] in the middle of May, he was able to ambulate better, but continued to have pain so he fully went to [facility] urgent care today, where x-ray imaging revealed what appears to be a right femur fracture. pt is now s/p R DA THA on 01/20/24.  PMH: hypertension, GERD, hypothyroidism    PT Comments  Pt progressing very well, meeting goals and is ready to d/c home with family assist as needed from PT standpoint; see below for mobility; THA therapeutic ex program reviewed/reinforced with pt. Dicussed proper use of cane when pt is ready--allowing stability and pain to be his guide for transition, typically 7-10 days with RW for safety. Pt verbalizes understanding   If plan is discharge home, recommend the following: Help with stairs or ramp for entrance   Can travel by private vehicle        Equipment Recommendations  Rolling walker (2 wheels)    Recommendations for Other Services       Precautions / Restrictions Precautions Precautions: Fall Recall of Precautions/Restrictions: Intact Precaution/Restrictions Comments: WBAT Restrictions Weight Bearing Restrictions Per Provider Order: No     Mobility  Bed Mobility Overal bed mobility: Needs Assistance Bed Mobility: Supine to Sit     Supine to sit: Modified independent (Device/Increase time)          Transfers Overall transfer level: Needs assistance Equipment used: Rolling walker (2 wheels) Transfers: Sit to/from Stand Sit to Stand: Supervision, Modified independent (Device/Increase time)           General transfer comment: cues for hand  placement; pt self cues end of session    Ambulation/Gait Ambulation/Gait assistance: Supervision, Modified independent (Device/Increase time) Gait Distance (Feet): 200 Feet Assistive device: Rolling walker (2 wheels) Gait Pattern/deviations: Step-to pattern, Step-through pattern       General Gait Details: cues for RW position, step length   Stairs Stairs: Yes Stairs assistance: Contact guard assist, Supervision Stair Management: Two rails Number of Stairs: 3 General stair comments: cues for sequence, pt is able to perform reciprocal pattern to ascend, step to for descent. no LOB, supervision for safety only   Wheelchair Mobility     Tilt Bed    Modified Rankin (Stroke Patients Only)       Balance                                            Communication Communication Communication: No apparent difficulties  Cognition Arousal: Alert Behavior During Therapy: WFL for tasks assessed/performed   PT - Cognitive impairments: No apparent impairments                         Following commands: Intact      Cueing Cueing Techniques: Verbal cues  Exercises Total Joint Exercises Long Arc Quad: AROM, Strengthening, Right, 10 reps    General Comments General comments (skin integrity, edema, etc.): reviewed standing THA HEP (Dr Bernard Brick reviewed with pt earlier and pt  is able to verbalize; discussed progression of HEP and activity at home      Pertinent Vitals/Pain Pain Assessment Pain Assessment: No/denies pain    Home Living Family/patient expects to be discharged to:: Private residence Living Arrangements: Spouse/significant other Available Help at Discharge: Family;Available 24 hours/day Type of Home: House Home Access: Stairs to enter Entrance Stairs-Rails: Right Entrance Stairs-Number of Steps: 2   Home Layout: Two level;Able to live on main level with bedroom/bathroom Home Equipment: Standard Walker;Cane - single point       Prior Function            PT Goals (current goals can now be found in the care plan section) Acute Rehab PT Goals PT Goal Formulation: With patient Time For Goal Achievement: 01/28/24 Potential to Achieve Goals: Good Progress towards PT goals: Goals met/education completed, patient discharged from PT    Frequency    7X/week      PT Plan      Co-evaluation              AM-PAC PT "6 Clicks" Mobility   Outcome Measure  Help needed turning from your back to your side while in a flat bed without using bedrails?: None Help needed moving from lying on your back to sitting on the side of a flat bed without using bedrails?: None Help needed moving to and from a bed to a chair (including a wheelchair)?: A Little Help needed standing up from a chair using your arms (e.g., wheelchair or bedside chair)?: A Little Help needed to walk in hospital room?: A Little Help needed climbing 3-5 steps with a railing? : A Little 6 Click Score: 20    End of Session Equipment Utilized During Treatment: Gait belt Activity Tolerance: Patient tolerated treatment well Patient left: with call bell/phone within reach;in chair;with chair alarm set;with family/visitor present Nurse Communication: Mobility status PT Visit Diagnosis: Other abnormalities of gait and mobility (R26.89)     Time: 7829-5621 PT Time Calculation (min) (ACUTE ONLY): 13 min  Charges:    $Gait Training: 8-22 mins PT General Charges $$ ACUTE PT VISIT: 1 Visit                     Jolette Lana, PT  Acute Rehab Dept ([facility]) [phone]  01/22/2024    [facility] 01/22/2024, 12:01 PM

## 2024-01-22 NOTE — TOC Progression Note (Signed)
 Transition of Care [facility]) - Progression Note    Patient Details  Name: Tex Powless MRN: [id number] Date of Birth: 1940-09-24  [facility]) CM/SW Contact  Bari Leys, RN Phone Number: 01/22/2024, 9:41 AM  Clinical Narrative:   PT eval completed,  HEP per physicans recommendation, RW- [facility] primary insurance-pt would need to arrange on his own for auth after, has secondary [employer] plan, agreeable to bill Franco Isaac for RW-[employer] delivered RW to bedside. TOC will continue to follow.      Expected Discharge Plan: Home w Home Health Services Barriers to Discharge: Continued Medical Work up  Expected Discharge Plan and Services       Living arrangements for the past 2 months: Single Family Home                                       Social Determinants of Health (SDOH) Interventions SDOH Screenings   Food Insecurity: No Food Insecurity (01/18/2024)  Housing: Low Risk  (01/18/2024)  Transportation Needs: No Transportation Needs (01/18/2024)  Utilities: Not At Risk (01/18/2024)  Financial Resource Strain: Low Risk  (01/15/2024)   Received from [facility]  Social Connections: Socially Integrated (01/18/2024)  Tobacco Use: Low Risk  (01/20/2024)    Readmission Risk Interventions    01/21/2024   10:02 AM  Readmission Risk Prevention Plan  Post Dischage Appt Complete  Medication Screening Complete  Transportation Screening Complete

## 2024-01-22 NOTE — Evaluation (Signed)
 Occupational Therapy Evaluation Patient Details Name: Robert Clarke MRN: [id number] DOB: [birth year] Today's Date: 01/22/2024   History of Present Illness   Patient is a 83 y.o. male who tripped over a hose in his yard on 12/27/2023 and hurt his right hip.  He went to the ER that day, also follow-up later with outpatient urgent care and was told that he did not have any fracture.  He actually went on a cruise to [place] in the middle of May, he was able to ambulate better, but continued to have pain so he fully went to [facility] urgent care today, where x-ray imaging revealed what appears to be a right femur fracture. pt is now s/p R DA THA on 01/20/24.  PMH: hypertension, GERD, hypothyroidism     Clinical Impressions Patient evaluated by Occupational Therapy with no further acute OT needs identified. All education has been completed and the patient has no further questions. Patient reported being at baseline with no need for education on AD/AE. Patient reported having wife support at home as needed.  See below for any follow-up Occupational Therapy or equipment needs. OT is signing off. Thank you for this referral.      If plan is discharge home, recommend the following:   A little help with bathing/dressing/bathroom;Assist for transportation;Help with stairs or ramp for entrance;Assistance with cooking/housework     Functional Status Assessment   Patient has had a recent decline in their functional status and demonstrates the ability to make significant improvements in function in a reasonable and predictable amount of time.     Equipment Recommendations   None recommended by OT      Precautions/Restrictions   Precautions Precautions: Fall Recall of Precautions/Restrictions: Intact Precaution/Restrictions Comments: WBAT Restrictions Weight Bearing Restrictions Per Provider Order: No     Mobility Bed Mobility               General bed mobility comments: patient up in  recliner and returned to the same.       Balance Overall balance assessment: No apparent balance deficits (not formally assessed)               ADL either performed or assessed with clinical judgement   ADL Overall ADL's : At baseline         General ADL Comments: patient completed grooming at sink with supervision at [facility] level with no UE support for denture management. patient declining to wash up reporting he would complete this when wife gets here. attempted to engage patient in education on AE for LB Dressing/bathing when RLE was still hurting with patient demonstrating ability to reach foot on R side for sock adjustment. paitent reporting he does not wear socks at home and that wife would help if he had a bad day. patient declined practice for donning/doffing pants. patient declined needing education on AE at this time. patient endorsed not needing further OT.     Vision   Vision Assessment?: No apparent visual deficits            Pertinent Vitals/Pain Pain Assessment Pain Assessment: No/denies pain     Extremity/Trunk Assessment Upper Extremity Assessment Upper Extremity Assessment: Overall WFL for tasks assessed   Lower Extremity Assessment Lower Extremity Assessment: Defer to PT evaluation   Cervical / Trunk Assessment Cervical / Trunk Assessment: Normal   Communication     Cognition Arousal: Alert Behavior During Therapy: WFL for tasks assessed/performed Cognition: No apparent impairments  Following commands: Intact                  Home Living Family/patient expects to be discharged to:: Private residence Living Arrangements: Spouse/significant other Available Help at Discharge: Family;Available 24 hours/day Type of Home: House Home Access: Stairs to enter [employer] of Steps: 2 Entrance Stairs-Rails: Right Home Layout: Two level;Able to live on main level with bedroom/bathroom          Bathroom Toilet: Handicapped height     Home Equipment: Standard Walker;Cane - single point          Prior Functioning/Environment Prior Level of Function : Independent/Modified Independent                            OT Goals(Current goals can be found in the care plan section)   Acute Rehab OT Goals OT Goal Formulation: All assessment and education complete, DC therapy   OT Frequency:          AM-PAC OT "6 Clicks" Daily Activity     Outcome Measure Help from another person eating meals?: None Help from another person taking care of personal grooming?: None Help from another person toileting, which includes using toliet, bedpan, or urinal?: None Help from another person bathing (including washing, rinsing, drying)?: A Little Help from another person to put on and taking off regular upper body clothing?: None Help from another person to put on and taking off regular lower body clothing?: A Little 6 Click Score: 22   End of Session Equipment Utilized During Treatment: Rolling walker (2 wheels) Nurse Communication: Other (comment) (nurse in room at end of session)  Activity Tolerance: Patient tolerated treatment well Patient left: in chair;with call bell/phone within reach;Other (comment) (nurse in room)  OT Visit Diagnosis: Unsteadiness on feet (R26.81);Other abnormalities of gait and mobility (R26.89);Pain Pain - Right/Left: Right Pain - part of body: Hip                Time: 1610-9604 OT Time Calculation (min): 13 min Charges:  OT General Charges $OT Visit: 1 Visit OT Evaluation $OT Eval Low Complexity: 1 Low  Pieter Fooks OTR/L, MS Acute Rehabilitation Department Office# [phone]   Jame Maze 01/22/2024, 9:50 AM

## 2024-01-22 NOTE — Discharge Summary (Signed)
 Physician Discharge Summary  Robert Clarke QMV:[id number] DOB: June 13, 1941 DOA: 01/18/2024  PCP: Robert Moulding, MD  Admit date: 01/18/2024 Discharge date: 01/22/2024  Time spent: 60 minutes  Recommendations for Outpatient Follow-up:  Follow-up with Dr. Bernard Brick, orthopedics in 2 weeks. Follow-up with Robert Moulding, MD in 1 week.  On follow-up patient will need a basic metabolic profile done to follow-up on electrolytes and renal function.  Patient will need a CBC done to follow-up on counts.  Patient's blood pressure need to be reassessed as patient's HCTZ was discontinued during the hospitalization and recommended not to resume in light of patient's hyponatremia.  Patient discharged on Avapro for BP control.   Discharge Diagnoses:  Principal Problem:   Closed right hip fracture, initial encounter [facility]) Active Problems:   Essential hypertension   Hypothyroidism   GERD (gastroesophageal reflux disease)   S/P total right hip arthroplasty   Hyponatremia   Leukocytosis   Asymptomatic bacteriuria   Discharge Condition: Stable and improved.  Diet recommendation: Regular, 1200 cc fluid restriction x 3 days.  Filed Weights   01/18/24 1410 01/20/24 0517 01/22/24 0810  Weight: 108 kg 104 kg 109.7 kg    History of present illness:  HPI Per Dr. Teri Fergusson Clarke is a 83 y.o. male with medical history significant for hypertension, GERD, hypothyroidism being admitted to the hospital with a right hip fracture.  Patient is quite healthy and active, he tripped over a hose in his yard on 12/27/2023 and hurt his right hip.  He went to the ER that day, also follow-up later with outpatient urgent care and was told that he did not have any fracture.  He actually went on a cruise to [place] in the middle of May, he was able to ambulate better, but continued to have pain so he fully went to Ortho urgent care today, where x-ray imaging revealed what appears to be a right femur fracture.  He was  seen by orthopedics Dr. Bernard Brick, and directly admitted to the hospitalist service for planned surgical repair on 5/25.   Hospital Course:  #1 right hip femoral neck fracture status post right total hip replacement. -Secondary to mechanical fall. -Patient seen in consultation by orthopedics and patient status post right total hip replacement through anterior approach per Dr. Bernard Brick, 01/20/2024.  -Patient improved clinically, was seen by physical therapy who recommended home health therapies. -Patient with at home on aspirin  twice daily for DVT prophylaxis per orthopedics recommendation as well as oxycodone  as needed for pain and Robaxin as needed for muscle spasms. -Outpatient follow-up with orthopedics in 2 weeks.   2.  Hypertension - Patient maintained on Avapro during the hospitalization.   - HCTZ discontinued due to hyponatremia and will not be resumed on discharge.   - Outpatient follow-up with PCP.    3.  Hypothyroidism - Patient maintained on home regimen Synthroid .   4.  GERD - Patient maintained on PPI.   5.  Hyponatremia -Patient noted to be on HCTZ prior to admission which has been discontinued. - Patient initially hydrated gently with IV fluids with sodium level going down to 126.   - IV fluids discontinued, patient given Lasix 20 mg p.o. x 1, patient placed on fluid restriction 400 cc/day and started on salt tablets of 2 g 3 times daily.   - Repeat labs came back with a sodium of 132.   - Patient will be discharged home on salt tablets 1 g twice daily x 3 days, 3 days of  fluid restriction of 1200 cc/day, discontinuation of HCTZ and recommend not to resume.   - Discussed with nephrology.   - Outpatient follow-up with PCP for repeat labs in 1 week.   6.  Asymptomatic bacteriuria.  -Urinalysis with moderate leukocytes, nitrite negative, 21-50 WBCs. -Patient placed empirically on IV Rocephin pending urine cultures. -Urine cultures noted 20,000 colonies of Pseudomonas, felt likely  contaminant and IV antibiotics discontinued. -No further antibiotics needed. -Discussed with ID.   7.  Leukocytosis -Questionable etiology. -Patient noted to have received a dose of IV Decadron  perioperatively.  - Chest x-ray negative for any acute infiltrates. - Urinalysis concerning for UTI and patient placed on IV Rocephin however urine cultures with 20,000 colonies of Pseudomonas aeruginosa likely contaminant and asymptomatic bacteriuria and as such IV antibiotics discontinued. - Leukocytosis trended down. - Outpatient follow-up with PCP.  Procedures: Right total hip replacement through anterior approach per orthopedics: Dr. Bernard Brick 01/20/2024    Consultations: Orthopedics: Dr. Bernard Brick 01/19/2024   Discharge Exam: Vitals:   01/22/24 0628 01/22/24 1403  BP: (!) 148/83 124/65  Pulse: 67 64  Resp: 17 17  Temp: 98.3 F (36.8 C) 97.6 F (36.4 C)  SpO2: 97% 98%    General: NAD Cardiovascular: RRR no murmurs rubs or gallops.  No JVD.  No lower extremity edema. Respiratory: Clear to auscultation bilaterally.  No wheezes, no crackles, no rhonchi.  Fair air movement.  Speaking in full sentences.  Discharge Instructions   Discharge Instructions     Diet general   Complete by: As directed    Please do 1200 cc/day fluid restriction x 3 days.   Increase activity slowly   Complete by: As directed       Allergies as of 01/22/2024       Reactions   Statins Other (See Comments)   Myalgia, Atorvastatin , Pravastatin   Iohexol  Hives   Iodine contrast dye         Medication List     STOP taking these medications    HYDROcodone-acetaminophen  5-325 MG tablet Commonly known as: NORCO/VICODIN   predniSONE  20 MG tablet Commonly known as: DELTASONE    telmisartan-hydrochlorothiazide 80-12.5 MG tablet Commonly known as: MICARDIS HCT       TAKE these medications    acetaminophen  500 MG tablet Commonly known as: TYLENOL  Take 2 tablets (1,000 mg total) by mouth every 6 (six)  hours for 4 days.   aspirin  EC 81 MG tablet Commonly known as: Aspirin  81 Take 1 tablet (81 mg total) by mouth 2 (two) times daily. What changed:  medication strength when to take this   azelastine 0.1 % nasal spray Commonly known as: ASTELIN Place 2 sprays into both nostrils 2 (two) times daily as needed for allergies. Use in each nostril as directed   diclofenac  75 MG EC tablet Commonly known as: VOLTAREN  1 pill every 12 hours with food if needed for hip thigh or knee pain.   irbesartan 300 MG tablet Commonly known as: AVAPRO Take 1 tablet (300 mg total) by mouth daily. Start taking on: Jan 23, 2024   levothyroxine  150 MCG tablet Commonly known as: SYNTHROID  Take 150 mcg by mouth daily before breakfast.   methocarbamol 500 MG tablet Commonly known as: ROBAXIN Take 1 tablet (500 mg total) by mouth every 6 (six) hours as needed for muscle spasms.   omeprazole 40 MG capsule Commonly known as: PRILOSEC Take 40 mg by mouth daily.   oxyCODONE  5 MG immediate release tablet Commonly known as: Oxy IR/ROXICODONE   Take 1 tablet (5 mg total) by mouth every 4 (four) hours as needed for moderate pain (pain score 4-6) (pain score 4-6).   polyethylene glycol powder 17 GM/SCOOP powder Commonly known as: GLYCOLAX/MIRALAX Take 17 grams dissolved in liquid by mouth 2 (two) times daily.   senna-docusate 8.6-50 MG tablet Commonly known as: Senokot-S Take 1 tablet by mouth 2 (two) times daily.   sodium chloride  1 g tablet Take 1 tablet (1 g total) by mouth 2 (two) times daily with a meal for 3 days.               Durable Medical Equipment  (From admission, onward)           Start     Ordered   01/21/24 1652  For home use only DME Walker rolling  Once       Question Answer Comment  Walker: With 5 Inch Wheels   Patient needs a walker to treat with the following condition Hip fracture (HCC)      01/21/24 1651           Allergies  Allergen Reactions   Statins  Other (See Comments)    Myalgia, Atorvastatin , Pravastatin   Iohexol  Hives    Iodine contrast dye     Follow-up Information     Olin, Matthew, MD. Schedule an appointment as soon as possible for a visit in 2 week(s).   Specialty: Orthopedic Surgery Contact information: [address] [place] [address] [phone]         Robert Moulding, MD. Schedule an appointment as soon as possible for a visit in 1 week(s).   Specialty: Internal Medicine Why: Will need BMET and CBC (lab) done on follow up. Contact information: [address] [facility] [address] [place] [address] [phone]                  The results of significant diagnostics from this hospitalization (including imaging, microbiology, ancillary and laboratory) are listed below for reference.    Significant Diagnostic Studies: DG CHEST PORT 1 VIEW Result Date: 01/21/2024 CLINICAL DATA:  100030 Leukocytosis 100030 EXAM: PORTABLE CHEST 1 VIEW COMPARISON:  April twenty-ninth 2024 FINDINGS: The cardiomediastinal silhouette is unchanged in contour.Atherosclerotic calcifications of the tortuous thoracic aorta. No pleural effusion. No pneumothorax. No acute pleuroparenchymal abnormality. IMPRESSION: No acute cardiopulmonary abnormality. Electronically Signed   By: Clancy Crimes M.D.   On: 01/21/2024 11:20   DG Pelvis Portable Result Date: 01/20/2024 CLINICAL DATA:  Status post hip arthroplasty. EXAM: PORTABLE PELVIS 1-2 VIEWS COMPARISON:  Preoperative imaging FINDINGS: Right hip arthroplasty in expected alignment. No periprosthetic lucency or fracture. Recent postsurgical change includes air and edema in the soft tissues. IMPRESSION: Right hip arthroplasty without immediate postoperative complication. Electronically Signed   By: Chadwick Colonel M.D.   On: 01/20/2024 11:31   DG HIP UNILAT WITH PELVIS 1V RIGHT Result Date: 01/20/2024 CLINICAL DATA:  Elective surgery. EXAM: DG HIP  (WITH OR WITHOUT PELVIS) 1V RIGHT COMPARISON:  Preoperative imaging FINDINGS: Eight fluoroscopic spot views of the pelvis and right hip obtained in the operating room. Sequential images during hip arthroplasty. Fluoroscopy time 12 seconds. Dose 2.03 mGy. IMPRESSION: Intraoperative fluoroscopy during right hip arthroplasty. Electronically Signed   By: Chadwick Colonel M.D.   On: 01/20/2024 09:57   DG C-Arm 1-60 Min-No Report Result Date: 01/20/2024 Fluoroscopy was utilized by the requesting physician.  No radiographic interpretation.   DG Hip Unilat With Pelvis 2-3  Views Right Result Date: 01/13/2024 CLINICAL DATA:  Acute right hip pain. Right thigh pain. Recent fall. EXAM: DG HIP (WITH OR WITHOUT PELVIS) 2-3V RIGHT; RIGHT FEMUR 2 VIEWS COMPARISON:  Hip radiograph and CT 12/28/2023 FINDINGS: Pelvis and right hip: The iliac crests are not entirely included in the field of view. There is no evidence of acute fracture of the pelvis or right hip. No hip dislocation. Mild right hip osteoarthritis with subchondral cysts and acetabular spurring. The pubic rami are intact. Pubic symphysis and sacroiliac joints are congruent. Stable sclerotic focus within the ischium. Surgical clips in the pelvis. Femur: No acute femur fracture. No erosive or bony destructive change. Knee alignment is maintained with mild degenerative change. No focal bone lesion. No focal soft tissue abnormalities. IMPRESSION: 1. No acute fracture or subluxation of the pelvis, right hip, or femur. 2. Mild right hip osteoarthritis. Electronically Signed   By: Chadwick Colonel M.D.   On: 01/13/2024 13:48   DG Femur Min 2 Views Right Result Date: 01/13/2024 CLINICAL DATA:  Acute right hip pain. Right thigh pain. Recent fall. EXAM: DG HIP (WITH OR WITHOUT PELVIS) 2-3V RIGHT; RIGHT FEMUR 2 VIEWS COMPARISON:  Hip radiograph and CT 12/28/2023 FINDINGS: Pelvis and right hip: The iliac crests are not entirely included in the field of view. There is no  evidence of acute fracture of the pelvis or right hip. No hip dislocation. Mild right hip osteoarthritis with subchondral cysts and acetabular spurring. The pubic rami are intact. Pubic symphysis and sacroiliac joints are congruent. Stable sclerotic focus within the ischium. Surgical clips in the pelvis. Femur: No acute femur fracture. No erosive or bony destructive change. Knee alignment is maintained with mild degenerative change. No focal bone lesion. No focal soft tissue abnormalities. IMPRESSION: 1. No acute fracture or subluxation of the pelvis, right hip, or femur. 2. Mild right hip osteoarthritis. Electronically Signed   By: Chadwick Colonel M.D.   On: 01/13/2024 13:48   CT Hip Right Wo Contrast Result Date: 12/28/2023 CLINICAL DATA:  Hip trauma.  Fracture suspected. EXAM: CT OF THE RIGHT HIP WITHOUT CONTRAST TECHNIQUE: Multidetector CT imaging of the right hip was performed according to the standard protocol. Multiplanar CT image reconstructions were also generated. RADIATION DOSE REDUCTION: This exam was performed according to the departmental dose-optimization program which includes automated exposure control, adjustment of the mA and/or kV according to patient size and/or use of iterative reconstruction technique. COMPARISON:  Pelvis and right hip radiographs 12/28/2023, CT abdomen pelvis 12/25/2022 FINDINGS: Bones/Joint/Cartilage Mildly decreased bone mineralization is similar to prior. No acute fracture is seen. Mild to moderate superior femoroacetabular joint space narrowing and superolateral acetabular degenerative osteophytosis. Minimal pubic symphysis joint space narrowing, subchondral sclerosis, superior osteophytosis. There is anterior bridging osteophytosis of the visualized portion of the right sacroiliac joint. A 12 mm sclerotic focus within the right ilium is unchanged from 12/25/2022 CT, compatible with a bone island. Ligaments Suboptimally assessed by CT. Muscles and Tendons Normal size  and density of the regional musculature. No gross tendon tear is seen. Soft tissues Surgical clips are seen within the prostate bed status post prostatectomy. IMPRESSION: 1. No acute fracture is seen. 2. 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 Electronically Signed   By: Bertina Broccoli M.D.   On: 12/28/2023 09:38   DG Knee Complete 4 Views Right Result Date: 12/28/2023 CLINICAL DATA:  fall. EXAM: RIGHT KNEE - COMPLETE 4+ VIEW COMPARISON:  None Available. FINDINGS: No acute fracture or dislocation. No aggressive osseous lesion.  There are degenerative changes of the knee joint in the form of moderately reduced tibiofemoral compartment joint space, along with tibial spiking and tricompartmental osteophytosis. No knee effusion or focal soft tissue swelling. No radiopaque foreign bodies. IMPRESSION: 1. No acute osseous abnormality of the right knee joint. 2. Moderate tricompartmental degenerative joint disease, as described above. Electronically Signed   By: Beula Brunswick M.D.   On: 12/28/2023 08:22   DG Hip Unilat With Pelvis 2-3 Views Right Result Date: 12/28/2023 CLINICAL DATA:  Fall. EXAM: DG HIP (WITH OR WITHOUT PELVIS) 2-3V RIGHT COMPARISON:  None Available. FINDINGS: Pelvis is intact with normal and symmetric sacroiliac joints. No acute fracture or dislocation. No aggressive osseous lesion. Visualized sacral arcuate lines are unremarkable. Unremarkable symphysis pubis. There are mild degenerative changes of bilateral hip joints without significant joint space narrowing. Osteophytosis of the superior acetabulum. Multiple surgical staples noted overlying the lower midline pelvis. No radiopaque foreign bodies. IMPRESSION: No acute osseous abnormality of the pelvis or right hip joint. Electronically Signed   By: Beula Brunswick M.D.   On: 12/28/2023 08:21    Microbiology: Recent Results (from the past 240 hours)  Surgical PCR screen     Status: None   Collection Time: 01/18/24  1:50 AM    Specimen: Nasal Mucosa; Nasal Swab  Result Value Ref Range Status   MRSA, PCR NEGATIVE NEGATIVE Final   Staphylococcus aureus NEGATIVE NEGATIVE Final    Comment: (NOTE) The Xpert SA Assay (FDA approved for NASAL specimens in patients 81 years of age and older), is one component of a comprehensive surveillance program. It is not intended to diagnose infection nor to guide or monitor treatment. Performed at [facility], [address], [place], [place] [address]   Urine Culture     Status: Abnormal (Preliminary result)   Collection Time: 01/21/24  8:07 AM   Specimen: Urine, Clean Catch  Result Value Ref Range Status   Specimen Description   Final    URINE, CLEAN CATCH Performed at [facility], [address], [place], [place] [address]    Special Requests   Final    NONE Performed at [facility], [address], [place], [place] [address]    Culture (A)  Final    20,000 COLONIES/mL PSEUDOMONAS AERUGINOSA SUSCEPTIBILITIES TO FOLLOW Performed at [facility], [address] [place], [place] [address]    Report Status PENDING  Incomplete     Labs: Basic Metabolic Panel: Recent Labs  Lab 01/19/24 0342 01/20/24 0342 01/21/24 0354 01/22/24 0329 01/22/24 1419  NA 131* 130* 128* 126* 132*  K 4.6 4.0 4.5 4.1 3.9  CL 96* 95* 94* 96* 98  CO2 28 27 25 25 26   GLUCOSE 93 65* 117* 106* 108*  BUN 22 24* 25* 18 17  CREATININE 1.00 1.08 1.11 0.88 1.06  CALCIUM  9.2 9.2 8.7* 8.2* 8.9  MG  --  2.2  --   --   --   PHOS  --  3.2  --   --   --    Liver Function Tests: Recent Labs  Lab 01/20/24 0342  ALBUMIN 3.4*   No results for input(s): "LIPASE", "AMYLASE" in the last 168 hours. No results for input(s): "AMMONIA" in the last 168 hours. CBC: Recent Labs  Lab 01/19/24 0342 01/20/24 0342 01/21/24 0354 01/22/24 0329  WBC 6.7 9.5 14.0* 11.4*  NEUTROABS  --  6.4  --   --   HGB 14.0  14.4 12.1* 11.6*  HCT 44.6  44.4 38.1* 35.8*  MCV 91.4 89.7 91.4 90.9  PLT 253 291 235 210   Cardiac Enzymes: No results for input(s): "CKTOTAL", "CKMB", "CKMBINDEX", "TROPONINI" in the last 168 hours. BNP: BNP (last 3 results) No results for input(s): "BNP" in the last 8760 hours.  ProBNP (last 3 results) No results for input(s): "PROBNP" in the last 8760 hours.  CBG: No results for input(s): "GLUCAP" in the last 168 hours.     Signed:  Hilda Lovings MD.  [facility] 01/22/2024, 4:52 PM

## 2024-01-22 NOTE — Progress Notes (Signed)
 Discharge medication given to Baldomero Bone LPN D Kevan Peers

## 2024-01-22 NOTE — Progress Notes (Signed)
 Patient ID: Robert Clarke, male   DOB: October 14, 1940, 83 y.o.   MRN: [id number] Subjective: 2 Days Post-Op Procedure(s) (LRB): ARTHROPLASTY, HIP, TOTAL, ANTERIOR APPROACH (Right)    Patient reports pain as mild to moderate.  Was able to work with PT yesterday and did well.  Some lateral thigh pain. Work up for leukocytosis, likely reactive  Objective:   VITALS:   Vitals:   01/21/24 2148 01/22/24 0628  BP: (!) 110/58 (!) 148/83  Pulse: 60 67  Resp: 17 17  Temp: 98 F (36.7 C) 98.3 F (36.8 C)  SpO2: 96% 97%    Neurovascular intact Incision: dressing C/D/I  LABS Recent Labs    01/20/24 0342 01/21/24 0354 01/22/24 0329  HGB 14.4 12.1* 11.6*  HCT 44.4 38.1* 35.8*  WBC 9.5 14.0* 11.4*  PLT 291 235 210    Recent Labs    01/20/24 0342 01/21/24 0354 01/22/24 0329  NA 130* 128* 126*  K 4.0 4.5 4.1  BUN 24* 25* 18  CREATININE 1.08 1.11 0.88  GLUCOSE 65* 117* 106*    No results for input(s): "LABPT", "INR" in the last 72 hours.   Assessment/Plan: 2 Days Post-Op Procedure(s) (LRB): ARTHROPLASTY, HIP, TOTAL, ANTERIOR APPROACH (Right)   Up with therapy Likely home today after therapy RTC in 2 weeks, already has appointment set ASA for DVT prophylaxis Oxycodone  for pain

## 2024-01-23 LAB — URINE CULTURE: Culture: 20000 — AB

## 2024-01-23 NOTE — TOC Transition Note (Signed)
 Transition of Care [facility]) - Discharge Note   Patient Details  Name: Robert Clarke MRN: [id number] Date of Birth: Jan 03, 1941  Transition of [facility] CM/SW Contact:  Bari Leys, RN Phone Number: 01/23/2024, 10:52 AM   Clinical Narrative:  LATE ENTRY: NCM received call from bedside nurse 01/22/2024 at 4:58pm, reports new order for [facility] PT. NCM advised allow patient to dc and NCM will arrange [facility] PT in the AM and notify patient, bedside nurse voiced understanding.   -10:53am [facility] PT referral sent to [facility], rep-Amy. NCM received call from [place] with [facility], confirmed accepted for [facility] PT, added to AVS.    -10:58pm NCM called to patient, introduced self, updated on [facility] PT order, referral sent to [facility], will receive contact from [facility] to schedule home visit, pt voiced understanding, no further questions/concerns       Barriers to Discharge: Continued Medical Work up   Patient Goals and CMS Choice Patient states their goals for this hospitalization and ongoing recovery are:: return home          Discharge Placement                       Discharge Plan and Services Additional resources added to the After Visit Summary for                                       Social Drivers of Health (SDOH) Interventions SDOH Screenings   Food Insecurity: No Food Insecurity (01/18/2024)  Housing: Low Risk  (01/18/2024)  Transportation Needs: No Transportation Needs (01/18/2024)  Utilities: Not At Risk (01/18/2024)  Financial Resource Strain: Low Risk  (01/15/2024)   Received from [facility]  Social Connections: Socially Integrated (01/18/2024)  Tobacco Use: Low Risk  (01/20/2024)     Readmission Risk Interventions    01/21/2024   10:02 AM  Readmission Risk Prevention Plan  Post Dischage Appt Complete  Medication Screening Complete  Transportation Screening Complete

## 2024-04-16 ENCOUNTER — Emergency Department (HOSPITAL_COMMUNITY)

## 2024-04-16 ENCOUNTER — Emergency Department (HOSPITAL_COMMUNITY)
Admission: EM | Admit: 2024-04-16 | Discharge: 2024-04-17 | Disposition: A | Discharge status: Home / Self Care | Attending: Emergency Medicine | Admitting: Emergency Medicine

## 2024-04-16 ENCOUNTER — Other Ambulatory Visit: Payer: Self-pay

## 2024-04-16 ENCOUNTER — Encounter (HOSPITAL_COMMUNITY): Payer: Self-pay

## 2024-04-16 DIAGNOSIS — R079 Chest pain, unspecified: Secondary | ICD-10-CM | POA: Diagnosis not present

## 2024-04-16 DIAGNOSIS — M545 Low back pain, unspecified: Secondary | ICD-10-CM | POA: Diagnosis not present

## 2024-04-16 DIAGNOSIS — Z23 Encounter for immunization: Secondary | ICD-10-CM | POA: Insufficient documentation

## 2024-04-16 DIAGNOSIS — Z8673 Personal history of transient ischemic attack (TIA), and cerebral infarction without residual deficits: Secondary | ICD-10-CM | POA: Insufficient documentation

## 2024-04-16 DIAGNOSIS — M542 Cervicalgia: Secondary | ICD-10-CM | POA: Insufficient documentation

## 2024-04-16 DIAGNOSIS — Z85828 Personal history of other malignant neoplasm of skin: Secondary | ICD-10-CM | POA: Insufficient documentation

## 2024-04-16 DIAGNOSIS — E039 Hypothyroidism, unspecified: Secondary | ICD-10-CM | POA: Diagnosis not present

## 2024-04-16 DIAGNOSIS — Y92410 Unspecified street and highway as the place of occurrence of the external cause: Secondary | ICD-10-CM | POA: Diagnosis not present

## 2024-04-16 DIAGNOSIS — I10 Essential (primary) hypertension: Secondary | ICD-10-CM | POA: Insufficient documentation

## 2024-04-16 DIAGNOSIS — Z8546 Personal history of malignant neoplasm of prostate: Secondary | ICD-10-CM | POA: Diagnosis not present

## 2024-04-16 MED ORDER — ACETAMINOPHEN 325 MG PO TABS
650.0000 mg | ORAL_TABLET | Freq: Once | ORAL | Status: AC
  Administered 2024-04-16: 650 mg via ORAL
  Filled 2024-04-16: qty 2
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
## 2024-04-17 MED ORDER — TETANUS-DIPHTH-ACELL PERTUSSIS 5-2.5-18.5 LF-MCG/0.5 IM SUSY
0.5000 mL | PREFILLED_SYRINGE | Freq: Once | INTRAMUSCULAR | Status: AC
  Administered 2024-04-17: 0.5 mL via INTRAMUSCULAR
  Filled 2024-04-17: qty 0.5

## 2024-04-17 MED ORDER — IBUPROFEN 200 MG PO TABS
600.0000 mg | ORAL_TABLET | Freq: Once | ORAL | Status: AC
  Administered 2024-04-17: 600 mg via ORAL
  Filled 2024-04-17: qty 3

## 2024-04-17 NOTE — Discharge Instructions (Signed)
 You were evaluated in the Emergency Department and after careful evaluation, we did not find any emergent condition requiring admission or further testing in the hospital.  Your exam/testing today is overall reassuring.  Imaging here in the emergency department does not show any significant traumatic injuries.  You will be very sore for the next few days.  Use Tylenol  or Motrin  for discomfort.  Please return to the Emergency Department if you experience any worsening of your condition.   Thank you for allowing us  to be a part of your care.

## 2024-04-17 NOTE — ED Notes (Signed)
 Patient d/c with home care instructions. Wife at bedside
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
## 2024-07-17 ENCOUNTER — Other Ambulatory Visit
Admission: RE | Admit: 2024-07-17 | Discharge: 2024-07-17 | Disposition: A | Discharge status: Home / Self Care | Source: Ambulatory Visit | Attending: Internal Medicine | Admitting: Internal Medicine

## 2024-07-17 DIAGNOSIS — R0789 Other chest pain: Secondary | ICD-10-CM | POA: Diagnosis present

## 2024-07-17 LAB — D-DIMER, QUANTITATIVE: D-Dimer, Quant: 0.5 ug{FEU}/mL (ref 0.00–0.50)
